# Patient Record
Sex: Female | Born: 1965 | Race: Black or African American | Hispanic: No | Marital: Married | Smoking: Former smoker
Health system: Southern US, Community
[De-identification: ages and names within clinical notes are randomized; demographics above are authoritative.]

## PROBLEM LIST (undated history)

## (undated) DIAGNOSIS — T7840XA Allergy, unspecified, initial encounter: Secondary | ICD-10-CM

## (undated) DIAGNOSIS — R42 Dizziness and giddiness: Secondary | ICD-10-CM

## (undated) DIAGNOSIS — E7800 Pure hypercholesterolemia, unspecified: Secondary | ICD-10-CM

## (undated) DIAGNOSIS — F32A Depression, unspecified: Secondary | ICD-10-CM

## (undated) DIAGNOSIS — I10 Essential (primary) hypertension: Secondary | ICD-10-CM

## (undated) DIAGNOSIS — G43909 Migraine, unspecified, not intractable, without status migrainosus: Secondary | ICD-10-CM

## (undated) DIAGNOSIS — M1990 Unspecified osteoarthritis, unspecified site: Secondary | ICD-10-CM

## (undated) DIAGNOSIS — J45909 Unspecified asthma, uncomplicated: Secondary | ICD-10-CM

## (undated) HISTORY — PX: BREAST EXCISIONAL BIOPSY: SUR124

## (undated) HISTORY — DX: Depression, unspecified: F32.A

## (undated) HISTORY — DX: Unspecified osteoarthritis, unspecified site: M19.90

## (undated) HISTORY — DX: Migraine, unspecified, not intractable, without status migrainosus: G43.909

## (undated) HISTORY — PX: TONSILLECTOMY: SUR1361

## (undated) HISTORY — DX: Unspecified asthma, uncomplicated: J45.909

## (undated) HISTORY — DX: Allergy, unspecified, initial encounter: T78.40XA

## (undated) HISTORY — DX: Dizziness and giddiness: R42

## (undated) HISTORY — DX: Essential (primary) hypertension: I10

## (undated) HISTORY — PX: BREAST SURGERY: SHX581

## (undated) HISTORY — PX: CYST REMOVAL LEG: SHX6280

## (undated) HISTORY — PX: FRACTURE SURGERY: SHX138

## (undated) HISTORY — PX: ABDOMINAL HYSTERECTOMY: SHX81

## (undated) HISTORY — DX: Pure hypercholesterolemia, unspecified: E78.00

---

## 1998-08-11 ENCOUNTER — Encounter: Admission: RE | Admit: 1998-08-11 | Discharge: 1998-11-09 | Payer: Self-pay | Admitting: Unknown Physician Specialty

## 1998-11-14 ENCOUNTER — Encounter: Admission: RE | Admit: 1998-11-14 | Discharge: 1999-02-12 | Payer: Self-pay

## 2015-12-14 ENCOUNTER — Other Ambulatory Visit: Payer: Self-pay | Admitting: Physician Assistant

## 2015-12-14 ENCOUNTER — Telehealth: Payer: Self-pay | Admitting: Physician Assistant

## 2015-12-14 MED ORDER — AMLODIPINE BESYLATE 10 MG PO TABS: 10.0000 mg | ORAL_TABLET | Freq: Every day | ORAL | 0 refills | Status: DC

## 2015-12-14 NOTE — Telephone Encounter (Signed)
Okay to call in #2 tabs as the patient requested

## 2015-12-14 NOTE — Telephone Encounter (Signed)
Rx sent, patient aware.

## 2015-12-15 ENCOUNTER — Ambulatory Visit (INDEPENDENT_AMBULATORY_CARE_PROVIDER_SITE_OTHER): Payer: BLUE CROSS/BLUE SHIELD | Admitting: Physician Assistant

## 2015-12-15 ENCOUNTER — Encounter: Payer: Self-pay | Admitting: Physician Assistant

## 2015-12-15 VITALS — BP 133/89 | HR 73 | Temp 97.1°F | Ht 63.0 in | Wt 205.6 lb

## 2015-12-15 DIAGNOSIS — I10 Essential (primary) hypertension: Secondary | ICD-10-CM | POA: Diagnosis not present

## 2015-12-15 DIAGNOSIS — J0100 Acute maxillary sinusitis, unspecified: Secondary | ICD-10-CM

## 2015-12-15 DIAGNOSIS — K5901 Slow transit constipation: Secondary | ICD-10-CM

## 2015-12-15 MED ORDER — POLYETHYLENE GLYCOL 3350 17 GM/SCOOP PO POWD: 17.0000 g | Freq: Once | ORAL | 1 refills | Status: AC

## 2015-12-15 MED ORDER — AMOXICILLIN 500 MG PO CAPS: 500.0000 mg | ORAL_CAPSULE | Freq: Three times a day (TID) | ORAL | 0 refills | Status: DC

## 2015-12-15 MED ORDER — HYDROCHLOROTHIAZIDE 25 MG PO TABS: 25.0000 mg | ORAL_TABLET | Freq: Every day | ORAL | 3 refills | Status: DC

## 2015-12-15 MED ORDER — AMLODIPINE BESYLATE 10 MG PO TABS: 10.0000 mg | ORAL_TABLET | Freq: Every day | ORAL | 3 refills | Status: DC

## 2015-12-15 NOTE — Progress Notes (Signed)
BP 133/89 (BP Location: Left Arm, Patient Position: Sitting, Cuff Size: Large)   Pulse 73   Temp 97.1 F (36.2 C) (Oral)   Wt 205 lb 9.6 oz (93.3 kg)    Subjective:    Patient ID: Cynthia [name], female    DOB: 21-Mar-1966, 49 y.o.   MRN: [id number]  Cynthia [name] is a 50 y.o. female presenting on 12/15/2015 for Medication Refill   HPI Patient here to be established as new patient at [facility].  This patient is known to me from [facility]. This patient's medical history is positive for chronic constipation, hypertension, hysterectomy for dysfunctional uterine bleeding while in her 7s, and recurrent sinusitis. She is here today for medication refill and she is having exacerbation of her constipation and sinusitis. Her sinuses have been bothering her for 2 weeks now. Over-the-counter medications have not helped much. She is having copious drainage that is thick and green and she will occasionally see blood. Her constipation has been lifelong and she has never been on a regimen of any type of medication. We are going to give her instructions about a clean out and maintaining on MiraLAX 17 g per day.   Relevant past medical, surgical, family and social history reviewed and updated as indicated. Interim medical history since our last visit reviewed. Allergies and medications reviewed and updated.   Data reviewed from any sources in EPIC.  Review of Systems  Constitutional: Positive for fatigue. Negative for activity change and fever.  HENT: Positive for ear pain, nosebleeds, postnasal drip, sinus pressure, sneezing and sore throat.   Eyes: Negative.   Respiratory: Negative.  Negative for cough.   Cardiovascular: Negative.  Negative for chest pain.  Gastrointestinal: Positive for abdominal distention, abdominal pain and constipation.  Endocrine: Negative.   Genitourinary: Negative.  Negative for dysuria.  Musculoskeletal: Negative.   Skin:  Negative.   Neurological: Negative.     Per HPI unless specifically indicated above  Social History   Social History  . Marital status: Unknown    Spouse name: N/A  . Number of children: N/A  . Years of education: N/A   Occupational History  . Not on file.   Social History Main Topics  . Smoking status: Current Some Day Smoker  . Smokeless tobacco: Never Used  . Alcohol use Yes     Comment: occ  . Drug use: No  . Sexual activity: Not on file   Other Topics Concern  . Not on file   Social History Narrative  . No narrative on file    Past Surgical History:  Procedure Laterality Date  . ABDOMINAL HYSTERECTOMY    . BREAST SURGERY Left    lump removed   . CYST REMOVAL LEG      Family History  Problem Relation Age of Onset  . Hypertension Father   . Kidney disease Father       Medication List       Accurate as of 12/15/15 10:23 AM. Always use your most recent med list.          amLODipine 10 MG tablet Commonly known as:  NORVASC Take 1 tablet (10 mg total) by mouth daily.   amoxicillin 500 MG capsule Commonly known as:  AMOXIL Take 1 capsule (500 mg total) by mouth 3 (three) times daily.   hydrochlorothiazide 25 MG tablet Commonly known as:  HYDRODIURIL Take 1 tablet (25 mg total) by mouth daily.   polyethylene glycol powder  powder Commonly known as:  GLYCOLAX/MIRALAX Take 17 g by mouth once.          Objective:    BP 133/89 (BP Location: Left Arm, Patient Position: Sitting, Cuff Size: Large)   Pulse 73   Temp 97.1 F (36.2 C) (Oral)   Wt 205 lb 9.6 oz (93.3 kg)   No Known Allergies Wt Readings from Last 3 Encounters:  12/15/15 205 lb 9.6 oz (93.3 kg)    Physical Exam  Constitutional: She is oriented to person, place, and time. She appears well-developed and well-nourished.  HENT:  Head: Normocephalic and atraumatic.  Right Ear: External ear and ear canal normal. No tenderness.  Left Ear: Tympanic membrane, external ear and ear canal  normal. No tenderness.  Nose: Mucosal edema, rhinorrhea and sinus tenderness present. Right sinus exhibits maxillary sinus tenderness. Right sinus exhibits no frontal sinus tenderness. Left sinus exhibits maxillary sinus tenderness. Left sinus exhibits no frontal sinus tenderness.  Mouth/Throat: Uvula is midline. Posterior oropharyngeal erythema present. No oropharyngeal exudate.  Eyes: Conjunctivae and EOM are normal. Pupils are equal, round, and reactive to light.  Neck: Normal range of motion. Neck supple.  Cardiovascular: Normal rate, regular rhythm, normal heart sounds and intact distal pulses.   Pulmonary/Chest: Effort normal. No respiratory distress. She has no wheezes.  Abdominal: Soft. Bowel sounds are normal. She exhibits no mass. There is tenderness. There is no guarding.  Neurological: She is alert and oriented to person, place, and time. She has normal reflexes.  Skin: Skin is warm and dry. No rash noted.  Psychiatric: She has a normal mood and affect. Her behavior is normal. Judgment and thought content normal.    No results found for this or any previous visit.    Assessment & Plan:   1. Essential hypertension Amlodipine 10 mg 1 daily Hydrochlorothiazide 25 mg 1 daily  2. Acute maxillary sinusitis, recurrence not specified Amoxicillin 500 mg 1 tab po QID 10 days  3. Slow transit constipation - polyethylene glycol powder (GLYCOLAX/MIRALAX) powder; Take 17 g by mouth once.  Dispense: 3350 g; Refill: 1  Follow up plan: Return in about 6 weeks (around 01/26/2016) for complete/mammo/labs.  Terald Sleeper PA-C [facility] [address]  [place], [place] [address] [phone]   12/15/2015, 10:23 AM

## 2015-12-15 NOTE — Patient Instructions (Signed)
GI clean out information givenConstipation, Adult Constipation is when a person has fewer than three bowel movements a week, has difficulty having a bowel movement, or has stools that are dry, hard, or larger than normal. As people grow older, constipation is more common. A low-fiber diet, not taking in enough fluids, and taking certain medicines may make constipation worse.  CAUSES   Certain medicines, such as antidepressants, pain medicine, iron supplements, antacids, and water pills.   Certain diseases, such as diabetes, irritable bowel syndrome (IBS), thyroid disease, or depression.   Not drinking enough water.   Not eating enough fiber-rich foods.   Stress or travel.   Lack of physical activity or exercise.   Ignoring the urge to have a bowel movement.   Using laxatives too much.  SIGNS AND SYMPTOMS   Having fewer than three bowel movements a week.   Straining to have a bowel movement.   Having stools that are hard, dry, or larger than normal.   Feeling full or bloated.   Pain in the lower abdomen.   Not feeling relief after having a bowel movement.  DIAGNOSIS  Your health care provider will take a medical history and perform a physical exam. Further testing may be done for severe constipation. Some tests may include:  A barium enema X-ray to examine your rectum, colon, and, sometimes, your small intestine.   A sigmoidoscopy to examine your lower colon.   A colonoscopy to examine your entire colon. TREATMENT  Treatment will depend on the severity of your constipation and what is causing it. Some dietary treatments include drinking more fluids and eating more fiber-rich foods. Lifestyle treatments may include regular exercise. If these diet and lifestyle recommendations do not help, your health care provider may recommend taking over-the-counter laxative medicines to help you have bowel movements. Prescription medicines may be prescribed if  over-the-counter medicines do not work.  HOME CARE INSTRUCTIONS   Eat foods that have a lot of fiber, such as fruits, vegetables, whole grains, and beans.  Limit foods high in fat and processed sugars, such as french fries, hamburgers, cookies, candies, and soda.   A fiber supplement may be added to your diet if you cannot get enough fiber from foods.   Drink enough fluids to keep your urine clear or pale yellow.   Exercise regularly or as directed by your health care provider.   Go to the restroom when you have the urge to go. Do not hold it.   Only take over-the-counter or prescription medicines as directed by your health care provider. Do not take other medicines for constipation without talking to your health care provider first.  [place] IF:   You have bright red blood in your stool.   Your constipation lasts for more than 4 days or gets worse.   You have abdominal or rectal pain.   You have thin, pencil-like stools.   You have unexplained weight loss. MAKE SURE YOU:   Understand these instructions.  Will watch your condition.  Will get help right away if you are not doing well or get worse.   This information is not intended to replace advice given to you by your health care provider. Make sure you discuss any questions you have with your health care provider.   Document Released: 12/23/2003 Document Revised: 04/16/2014 Document Reviewed: 01/05/2013 Elsevier Interactive Patient Education [employer].

## 2016-01-02 ENCOUNTER — Encounter: Payer: BLUE CROSS/BLUE SHIELD | Admitting: *Deleted

## 2016-01-03 ENCOUNTER — Telehealth: Payer: Self-pay | Admitting: Physician Assistant

## 2016-01-03 NOTE — Telephone Encounter (Signed)
appt letter faxed

## 2016-01-04 ENCOUNTER — Encounter: Payer: BLUE CROSS/BLUE SHIELD | Admitting: Physician Assistant

## 2016-01-06 ENCOUNTER — Encounter: Payer: BLUE CROSS/BLUE SHIELD | Admitting: Physician Assistant

## 2016-01-12 ENCOUNTER — Encounter: Payer: BLUE CROSS/BLUE SHIELD | Admitting: Physician Assistant

## 2016-01-16 ENCOUNTER — Encounter: Payer: BLUE CROSS/BLUE SHIELD | Admitting: Physician Assistant

## 2016-01-23 ENCOUNTER — Encounter: Payer: Self-pay | Admitting: Physician Assistant

## 2016-01-23 ENCOUNTER — Ambulatory Visit (INDEPENDENT_AMBULATORY_CARE_PROVIDER_SITE_OTHER): Payer: BLUE CROSS/BLUE SHIELD | Admitting: Physician Assistant

## 2016-01-23 VITALS — BP 129/79 | HR 82 | Temp 97.0°F | Ht 63.0 in | Wt 206.6 lb

## 2016-01-23 DIAGNOSIS — I10 Essential (primary) hypertension: Secondary | ICD-10-CM

## 2016-01-23 DIAGNOSIS — Z01419 Encounter for gynecological examination (general) (routine) without abnormal findings: Secondary | ICD-10-CM

## 2016-01-23 DIAGNOSIS — Z86018 Personal history of other benign neoplasm: Secondary | ICD-10-CM | POA: Insufficient documentation

## 2016-01-23 DIAGNOSIS — Z90710 Acquired absence of both cervix and uterus: Secondary | ICD-10-CM | POA: Insufficient documentation

## 2016-01-23 DIAGNOSIS — E785 Hyperlipidemia, unspecified: Secondary | ICD-10-CM

## 2016-01-23 DIAGNOSIS — Z6836 Body mass index (BMI) 36.0-36.9, adult: Secondary | ICD-10-CM | POA: Diagnosis not present

## 2016-01-23 DIAGNOSIS — Z0000 Encounter for general adult medical examination without abnormal findings: Secondary | ICD-10-CM | POA: Diagnosis not present

## 2016-01-23 DIAGNOSIS — J320 Chronic maxillary sinusitis: Secondary | ICD-10-CM

## 2016-01-23 DIAGNOSIS — Z1239 Encounter for other screening for malignant neoplasm of breast: Secondary | ICD-10-CM

## 2016-01-23 MED ORDER — AMOXICILLIN-POT CLAVULANATE 875-125 MG PO TABS: 1.0000 | ORAL_TABLET | Freq: Two times a day (BID) | ORAL | 1 refills | Status: DC

## 2016-01-23 NOTE — Progress Notes (Signed)
BP 129/79   Pulse 82   Temp 97 F (36.1 C) (Oral)   Ht 5\' 3"  (1.6 m)   Wt 206 lb 9.6 oz (93.7 kg)   BMI 36.60 kg/m    Subjective:    Patient ID: Cynthia [name], female    DOB: [birth year], 50 y.o.   MRN: [id number]  Cynthia [name] is a 50 y.o. female presenting on 01/23/2016 for Sinusitis and Annual Exam   Sinusitis  Associated symptoms include ear pain, sinus pressure and a sore throat. Pertinent negatives include no coughing or sneezing.  She has continued with right sided maxillary and frontal sinus pain. She has some tenderness around her eye. She is also having occasional dizziness when she moves quickly. Her left ear is not bothering her at all. She is here for her annual exam.   All of her medications are reviewed today. She is not having any other complaints at the sinusitis. All of her medications are reviewed and refilled today as needed. She is up-to-date on vaccines. She'll be having last performed today.   Relevant past medical, surgical, family and social history reviewed and updated as indicated. Interim medical history since our last visit reviewed. Allergies and medications reviewed and updated.   Data reviewed from any sources in EPIC.  Review of Systems  Constitutional: Negative.  Negative for activity change, fatigue and fever.  HENT: Positive for ear pain, facial swelling, postnasal drip, sinus pressure, sore throat and tinnitus. Negative for sneezing.   Eyes: Positive for discharge.  Respiratory: Negative.  Negative for cough.   Cardiovascular: Negative.  Negative for chest pain.  Gastrointestinal: Negative.  Negative for abdominal pain.  Endocrine: Negative.   Genitourinary: Negative.  Negative for dysuria.  Musculoskeletal: Negative.   Skin: Negative.   Neurological: Negative.      Social History   Social History  . Marital status: Unknown    Spouse name: N/A  . Number of children: N/A  . Years of education: N/A   Occupational History   . Not on file.   Social History Main Topics  . Smoking status: Current Some Day Smoker  . Smokeless tobacco: Never Used  . Alcohol use Yes     Comment: occ  . Drug use: No  . Sexual activity: Not on file   Other Topics Concern  . Not on file   Social History Narrative  . No narrative on file    Past Surgical History:  Procedure Laterality Date  . ABDOMINAL HYSTERECTOMY    . BREAST SURGERY Left    lump removed   . CYST REMOVAL LEG      Family History  Problem Relation Age of Onset  . Hypertension Father   . Kidney disease Father       Medication List       Accurate as of 01/23/16 10:23 AM. Always use your most recent med list.          amLODipine 10 MG tablet Commonly known as:  NORVASC Take 1 tablet (10 mg total) by mouth daily.   amoxicillin-clavulanate 875-125 MG tablet Commonly known as:  AUGMENTIN Take 1 tablet by mouth 2 (two) times daily.   hydrochlorothiazide 25 MG tablet Commonly known as:  HYDRODIURIL Take 1 tablet (25 mg total) by mouth daily.          Objective:    BP 129/79   Pulse 82   Temp 97 F (36.1 C) (Oral)   Ht 5\' 3"  (1.6 m)  Wt 206 lb 9.6 oz (93.7 kg)   BMI 36.60 kg/m   No Known Allergies Wt Readings from Last 3 Encounters:  01/23/16 206 lb 9.6 oz (93.7 kg)  12/15/15 205 lb 9.6 oz (93.3 kg)    Physical Exam  Constitutional: She is oriented to person, place, and time. She appears well-developed and well-nourished.  HENT:  Head: Normocephalic and atraumatic.  Eyes: Conjunctivae and EOM are normal. Pupils are equal, round, and reactive to light.  Neck: Normal range of motion. Neck supple.  Cardiovascular: Normal rate, regular rhythm, normal heart sounds and intact distal pulses.   Pulmonary/Chest: Effort normal and breath sounds normal.  Abdominal: Soft. Bowel sounds are normal.  Genitourinary: Vagina normal. Rectal exam shows no external hemorrhoid and no fissure. No breast swelling, tenderness, discharge or  bleeding. Pelvic exam was performed with patient supine. There is no tenderness or lesion on the right labia. There is no tenderness or lesion on the left labia. Right adnexum displays no mass, no tenderness and no fullness. Left adnexum displays no mass, no tenderness and no fullness. No tenderness or bleeding in the vagina. No vaginal discharge found.  Genitourinary Comments: S/p hysterectomy  Neurological: She is alert and oriented to person, place, and time. She has normal reflexes.  Skin: Skin is warm and dry. No rash noted.  Psychiatric: She has a normal mood and affect. Her behavior is normal. Judgment and thought content normal.        Assessment & Plan:   1. Well female exam with routine gynecological exam [facility], order will be sent Pap collection Breast exam performed  2. Essential hypertension Continue Medications  3. Chronic maxillary sinusitis - amoxicillin-clavulanate (AUGMENTIN) 875-125 MG tablet; Take 1 tablet by mouth 2 (two) times daily.  Dispense: 28 tablet; Refill: 1  4. History of uterine fibroid  5. S/P hysterectomy Due to fibroid pain  6. Body mass index 36.0-36.9, adult DASH diet   Continue all other maintenance medications as listed above. Educational handout given for DASH diet  Follow up plan: Return in about 1 year (around 01/22/2017).  Terald Sleeper PA-C [facility] [address]  [place],  [address] [phone]   01/23/2016, 10:23 AM

## 2016-01-23 NOTE — Patient Instructions (Signed)
DASH Eating Plan  DASH stands for "Dietary Approaches to Stop Hypertension." The DASH eating plan is a healthy eating plan that has been shown to reduce high blood pressure (hypertension). Additional health benefits may include reducing the risk of type 2 diabetes mellitus, heart disease, and stroke. The DASH eating plan may also help with weight loss.  WHAT DO I NEED TO KNOW ABOUT THE DASH EATING PLAN?  For the DASH eating plan, you will follow these general guidelines:  · Choose foods with a percent daily value for sodium of less than 5% (as listed on the food label).  · Use salt-free seasonings or herbs instead of table salt or sea salt.  · Check with your health care provider or pharmacist before using salt substitutes.  · Eat lower-sodium products, often labeled as "lower sodium" or "no salt added."  · Eat fresh foods.  · Eat more vegetables, fruits, and low-fat dairy products.  · Choose whole grains. Look for the word "whole" as the first word in the ingredient list.  · Choose fish and skinless chicken or turkey more often than red meat. Limit fish, poultry, and meat to 6 oz (170 g) each day.  · Limit sweets, desserts, sugars, and sugary drinks.  · Choose heart-healthy fats.  · Limit cheese to 1 oz (28 g) per day.  · Eat more home-cooked food and less restaurant, buffet, and fast food.  · Limit fried foods.  · Cook foods using methods other than frying.  · Limit canned vegetables. If you do use them, rinse them well to decrease the sodium.  · When eating at a restaurant, ask that your food be prepared with less salt, or no salt if possible.  WHAT FOODS CAN I EAT?  Seek help from a dietitian for individual calorie needs.  Grains  Whole grain or whole wheat bread. Brown rice. Whole grain or whole wheat pasta. Quinoa, bulgur, and whole grain cereals. Low-sodium cereals. Corn or whole wheat flour tortillas. Whole grain cornbread. Whole grain crackers. Low-sodium crackers.  Vegetables  Fresh or frozen vegetables  (raw, steamed, roasted, or grilled). Low-sodium or reduced-sodium tomato and vegetable juices. Low-sodium or reduced-sodium tomato sauce and paste. Low-sodium or reduced-sodium canned vegetables.   Fruits  All fresh, canned (in natural juice), or frozen fruits.  Meat and Other Protein Products  Ground beef (85% or leaner), grass-fed beef, or beef trimmed of fat. Skinless chicken or turkey. Ground chicken or turkey. Pork trimmed of fat. All fish and seafood. Eggs. Dried beans, peas, or lentils. Unsalted nuts and seeds. Unsalted canned beans.  Dairy  Low-fat dairy products, such as skim or 1% milk, 2% or reduced-fat cheeses, low-fat ricotta or cottage cheese, or plain low-fat yogurt. Low-sodium or reduced-sodium cheeses.  Fats and Oils  Tub margarines without trans fats. Light or reduced-fat mayonnaise and salad dressings (reduced sodium). Avocado. Safflower, olive, or canola oils. Natural peanut or almond butter.  Other  Unsalted popcorn and pretzels.  The items listed above may not be a complete list of recommended foods or beverages. Contact your dietitian for more options.  WHAT FOODS ARE NOT RECOMMENDED?  Grains  White bread. White pasta. White rice. Refined cornbread. Bagels and croissants. Crackers that contain trans fat.  Vegetables  Creamed or fried vegetables. Vegetables in a cheese sauce. Regular canned vegetables. Regular canned tomato sauce and paste. Regular tomato and vegetable juices.  Fruits  Dried fruits. Canned fruit in light or heavy syrup. Fruit juice.  Meat and Other Protein   Products  Fatty cuts of meat. Ribs, chicken wings, bacon, sausage, bologna, salami, chitterlings, fatback, hot dogs, bratwurst, and packaged luncheon meats. Salted nuts and seeds. Canned beans with salt.  Dairy  Whole or 2% milk, cream, half-and-half, and cream cheese. Whole-fat or sweetened yogurt. Full-fat cheeses or blue cheese. Nondairy creamers and whipped toppings. Processed cheese, cheese spreads, or cheese  curds.  Condiments  Onion and garlic salt, seasoned salt, table salt, and sea salt. Canned and packaged gravies. Worcestershire sauce. Tartar sauce. Barbecue sauce. Teriyaki sauce. Soy sauce, including reduced sodium. Steak sauce. Fish sauce. Oyster sauce. Cocktail sauce. Horseradish. Ketchup and mustard. Meat flavorings and tenderizers. Bouillon cubes. Hot sauce. Tabasco sauce. Marinades. Taco seasonings. Relishes.  Fats and Oils  Butter, stick margarine, lard, shortening, ghee, and bacon fat. Coconut, palm kernel, or palm oils. Regular salad dressings.  Other  Pickles and olives. Salted popcorn and pretzels.  The items listed above may not be a complete list of foods and beverages to avoid. Contact your dietitian for more information.  WHERE CAN I FIND MORE INFORMATION?  National Heart, Lung, and Blood Institute: www.nhlbi.nih.gov/health/health-topics/topics/dash/     This information is not intended to replace advice given to you by your health care provider. Make sure you discuss any questions you have with your health care provider.     Document Released: 03/15/2011 Document Revised: 04/16/2014 Document Reviewed: 01/28/2013  Elsevier Interactive Patient Education ©2016 Elsevier Inc.

## 2016-01-24 LAB — CBC WITH DIFFERENTIAL/PLATELET
BASOS ABS: 0 10*3/uL (ref 0.0–0.2)
Basos: 1 %
EOS (ABSOLUTE): 0.2 10*3/uL (ref 0.0–0.4)
Eos: 3 %
HEMATOCRIT: 37.6 % (ref 34.0–46.6)
HEMOGLOBIN: 12.5 g/dL (ref 11.1–15.9)
Immature Grans (Abs): 0 10*3/uL (ref 0.0–0.1)
Immature Granulocytes: 1 %
LYMPHS ABS: 2.9 10*3/uL (ref 0.7–3.1)
Lymphs: 43 %
MCH: 27.1 pg (ref 26.6–33.0)
MCHC: 33.2 g/dL (ref 31.5–35.7)
MCV: 81 fL (ref 79–97)
MONOCYTES: 7 %
Monocytes Absolute: 0.4 10*3/uL (ref 0.1–0.9)
NEUTROS ABS: 2.9 10*3/uL (ref 1.4–7.0)
Neutrophils: 45 %
Platelets: 378 10*3/uL (ref 150–379)
RBC: 4.62 x10E6/uL (ref 3.77–5.28)
RDW: 14.1 % (ref 12.3–15.4)
WBC: 6.4 10*3/uL (ref 3.4–10.8)

## 2016-01-24 LAB — CMP14+EGFR
ALBUMIN: 4.1 g/dL (ref 3.5–5.5)
ALK PHOS: 104 IU/L (ref 39–117)
ALT: 18 IU/L (ref 0–32)
AST: 17 IU/L (ref 0–40)
Albumin/Globulin Ratio: 1.3 (ref 1.2–2.2)
BUN / CREAT RATIO: 13 (ref 9–23)
BUN: 9 mg/dL (ref 6–24)
Bilirubin Total: 0.3 mg/dL (ref 0.0–1.2)
CO2: 25 mmol/L (ref 18–29)
CREATININE: 0.69 mg/dL (ref 0.57–1.00)
Calcium: 9.5 mg/dL (ref 8.7–10.2)
Chloride: 102 mmol/L (ref 96–106)
GFR, EST AFRICAN AMERICAN: 116 mL/min/{1.73_m2} (ref >59)
GFR, EST NON AFRICAN AMERICAN: 100 mL/min/{1.73_m2} (ref >59)
GLOBULIN, TOTAL: 3.1 g/dL (ref 1.5–4.5)
Glucose: 99 mg/dL (ref 65–99)
Potassium: 4 mmol/L (ref 3.5–5.2)
SODIUM: 141 mmol/L (ref 134–144)
Total Protein: 7.2 g/dL (ref 6.0–8.5)

## 2016-01-24 LAB — LIPID PANEL
CHOL/HDL RATIO: 5.8 ratio — AB (ref 0.0–4.4)
CHOLESTEROL TOTAL: 228 mg/dL — AB (ref 100–199)
HDL: 39 mg/dL — ABNORMAL LOW (ref >39)
LDL CALC: 153 mg/dL — AB (ref 0–99)
Triglycerides: 181 mg/dL — ABNORMAL HIGH (ref 0–149)
VLDL Cholesterol Cal: 36 mg/dL (ref 5–40)

## 2016-01-26 ENCOUNTER — Telehealth: Payer: Self-pay | Admitting: Physician Assistant

## 2016-01-26 NOTE — Telephone Encounter (Signed)
Patient aware that labs have not been reviewed

## 2016-01-26 NOTE — Telephone Encounter (Signed)
Called patient and LM that she can set screening mammogram up herself

## 2016-01-27 LAB — PAP IG W/ RFLX HPV ASCU: PAP SMEAR COMMENT: 0

## 2016-01-30 MED ORDER — SIMVASTATIN 10 MG PO TABS: 10.0000 mg | ORAL_TABLET | Freq: Every day | ORAL | 0 refills | Status: DC

## 2016-01-30 MED ORDER — ATORVASTATIN CALCIUM 10 MG PO TABS: 10.0000 mg | ORAL_TABLET | Freq: Every day | ORAL | 0 refills | Status: DC

## 2016-01-30 NOTE — Addendum Note (Signed)
Addended by: Thana Ates on: 01/30/2016 08:50 AM   Modules accepted: Orders

## 2016-01-30 NOTE — Telephone Encounter (Signed)
Resent to [facility] to confirm atorvastatin

## 2016-01-31 ENCOUNTER — Ambulatory Visit (INDEPENDENT_AMBULATORY_CARE_PROVIDER_SITE_OTHER): Payer: BLUE CROSS/BLUE SHIELD | Admitting: Physician Assistant

## 2016-01-31 VITALS — BP 136/88 | HR 79 | Temp 97.2°F | Ht 63.0 in | Wt 205.0 lb

## 2016-01-31 DIAGNOSIS — J0100 Acute maxillary sinusitis, unspecified: Secondary | ICD-10-CM

## 2016-01-31 DIAGNOSIS — H8111 Benign paroxysmal vertigo, right ear: Secondary | ICD-10-CM

## 2016-01-31 MED ORDER — METHYLPREDNISOLONE ACETATE 80 MG/ML IJ SUSP: 80.0000 mg | Freq: Once | INTRAMUSCULAR | Status: DC

## 2016-01-31 MED ORDER — MECLIZINE HCL 25 MG PO TABS: 25.0000 mg | ORAL_TABLET | Freq: Three times a day (TID) | ORAL | 0 refills | Status: DC | PRN

## 2016-01-31 NOTE — Patient Instructions (Signed)

## 2016-01-31 NOTE — Progress Notes (Signed)
BP 136/88   Pulse 79   Temp 97.2 F (36.2 C) (Oral)   Ht 5\' 3"  (1.6 m)   Wt 205 lb (93 kg)   BMI 36.31 kg/m    Subjective:    Patient ID: Cynthia [name], female    DOB: [birth year], 50 y.o.   MRN: [id number]  HPI: Cynthia [name] is a 50 y.o. female presenting on 01/31/2016 for Sinusitis; Dizziness; and watery eye Patient has not been taking her Augmentin on a regular basis. When she was taking 1 on that particular day she had some dizziness associated with the day and felt that it was caused by the medicine. So she is only been taking 1 a day since about it. Therefore she has had undertreated sinusitis. But at this point she is quite severely in pain on the right maxillary sinus and around the eye. She is also having pressure in her ear. She has daily consistent vertigo symptoms that are associated with the fluid builds up to the eustachian tube. She does have a brother that had had sinus surgery in the past. She has never seen an otolaryngologist concerning this.   Past Medical History:  Diagnosis Date  . Hypertension    Relevant past medical, surgical, family and social history reviewed and updated as indicated. Interim medical history since our last visit reviewed. Allergies and medications reviewed and updated. DATA REVIEWED: CHART IN EPIC  Social History   Social History  . Marital status: Unknown    Spouse name: N/A  . Number of children: N/A  . Years of education: N/A   Occupational History  . Not on file.   Social History Main Topics  . Smoking status: Current Some Day Smoker  . Smokeless tobacco: Never Used  . Alcohol use Yes     Comment: occ  . Drug use: No  . Sexual activity: Not on file   Other Topics Concern  . Not on file   Social History Narrative  . No narrative on file    Past Surgical History:  Procedure Laterality Date  . ABDOMINAL HYSTERECTOMY    . BREAST SURGERY Left    lump removed   . CYST REMOVAL LEG      Family History    Problem Relation Age of Onset  . Hypertension Father   . Kidney disease Father     Review of Systems  Constitutional: Negative.   HENT: Positive for congestion, ear pain, postnasal drip, sinus pressure and sore throat.   Eyes: Positive for pain.  Respiratory: Positive for cough and wheezing.   Gastrointestinal: Negative.   Genitourinary: Negative.   Neurological: Positive for dizziness.      Medication List       Accurate as of 01/31/16  4:24 PM. Always use your most recent med list.          amLODipine 10 MG tablet Commonly known as:  NORVASC Take 1 tablet (10 mg total) by mouth daily.   amoxicillin-clavulanate 875-125 MG tablet Commonly known as:  AUGMENTIN Take 1 tablet by mouth 2 (two) times daily.   atorvastatin 10 MG tablet Commonly known as:  LIPITOR Take 1 tablet (10 mg total) by mouth daily.   hydrochlorothiazide 25 MG tablet Commonly known as:  HYDRODIURIL Take 1 tablet (25 mg total) by mouth daily.   meclizine 25 MG tablet Commonly known as:  ANTIVERT Take 1 tablet (25 mg total) by mouth 3 (three) times daily as needed for dizziness.  Objective:    BP 136/88   Pulse 79   Temp 97.2 F (36.2 C) (Oral)   Ht 5\' 3"  (1.6 m)   Wt 205 lb (93 kg)   BMI 36.31 kg/m   No Known Allergies  Wt Readings from Last 3 Encounters:  01/31/16 205 lb (93 kg)  01/23/16 206 lb 9.6 oz (93.7 kg)  12/15/15 205 lb 9.6 oz (93.3 kg)    Physical Exam  Constitutional: She is oriented to person, place, and time. She appears well-developed and well-nourished.  HENT:  Head: Normocephalic and atraumatic.  Right Ear: External ear normal. Tympanic membrane is retracted. A middle ear effusion is present.  Left Ear: Tympanic membrane and external ear normal.  No middle ear effusion.  Nose: Mucosal edema and rhinorrhea present. Right sinus exhibits maxillary sinus tenderness and frontal sinus tenderness. Left sinus exhibits no maxillary sinus tenderness.   Mouth/Throat: Uvula is midline. Posterior oropharyngeal erythema present.  Eyes: Conjunctivae and EOM are normal. Pupils are equal, round, and reactive to light. Right eye exhibits no discharge. Left eye exhibits no discharge.  Neck: Normal range of motion.  Cardiovascular: Normal rate, regular rhythm and normal heart sounds.   Pulmonary/Chest: Effort normal and breath sounds normal. No respiratory distress. She has no wheezes.  Abdominal: Soft.  Lymphadenopathy:    She has no cervical adenopathy.  Neurological: She is alert and oriented to person, place, and time.  Skin: Skin is warm and dry.  Psychiatric: She has a normal mood and affect.        Assessment & Plan:   1. Acute non-recurrent maxillary sinusitis Complete Augmentin 875mg  BID. - methylPREDNISolone acetate (DEPO-MEDROL) injection 80 mg; Inject 1 mL (80 mg total) into the muscle once. - Ambulatory referral to ENT  2. Benign paroxysmal positional vertigo of right ear Meclizine 25 mg 1 tab QID for dizziness   Continue all other maintenance medications as listed above.  Follow up plan: Follow up as needed  Orders Placed This Encounter  Procedures  . Ambulatory referral to ENT    Educational handout given for   Terald Sleeper PA-C [facility] [address]  [place], [place] [address] [phone]   01/31/2016, 4:24 PM

## 2016-02-15 ENCOUNTER — Telehealth: Payer: Self-pay | Admitting: Physician Assistant

## 2016-02-15 DIAGNOSIS — Z0289 Encounter for other administrative examinations: Secondary | ICD-10-CM

## 2016-02-15 NOTE — Telephone Encounter (Signed)
Spoke with pt, FMLA is done and [employer] to be signed

## 2016-02-21 ENCOUNTER — Telehealth: Payer: Self-pay | Admitting: Physician Assistant

## 2016-02-22 NOTE — Telephone Encounter (Signed)
Pt is to bring me new forms

## 2016-04-09 HISTORY — PX: BREAST BIOPSY: SHX20

## 2016-04-13 ENCOUNTER — Encounter: Payer: Self-pay | Admitting: Neurology

## 2016-04-13 ENCOUNTER — Ambulatory Visit (INDEPENDENT_AMBULATORY_CARE_PROVIDER_SITE_OTHER): Payer: BLUE CROSS/BLUE SHIELD | Admitting: Neurology

## 2016-04-13 VITALS — BP 136/89 | HR 82 | Ht 62.0 in | Wt 211.2 lb

## 2016-04-13 DIAGNOSIS — G4489 Other headache syndrome: Secondary | ICD-10-CM | POA: Diagnosis not present

## 2016-04-13 MED ORDER — TOPIRAMATE 25 MG PO TABS: ORAL_TABLET | ORAL | 3 refills | Status: DC

## 2016-04-13 NOTE — Progress Notes (Signed)
Reason for visit: Headache  Referring physician: Dr. Tamala Julian is a 51 y.o. female  History of present illness:  Cynthia [name] is a 51 year old left-handed black female with a history of headaches throughout her life. The patient remembers having headaches as a child, and the headaches have always been on the right side. The patient has associated allergy type symptoms with nasal stuffiness, sneezing, and pressure sensations in the periorbital area and maxillary area on the right. The patient was felt to have sinus issues, but a recent ENT evaluation that included a sinus CT scan showed no evidence of sinus disease. The patient indicates that her headaches have gradually over time become more frequent, they are currently every other day. She is now starting to miss some days of work because the headache. The headache may occur off and on throughout the day, and she has soreness to touch on the lower right face, but the soreness will spread back into the entire right side of the head and includes the right neck with neck stiffness and discomfort. The patient has associated problems with dizziness and vertigo. She may have some nausea, but no vomiting. She may feel somewhat staggery at times. The patient may have a sensation of soreness on the maxillary teeth on the right. The patient reports photophobia and phonophobia with the headache. She may have some numbness and tingling sensations on the hands at times. She denies any issues controlling the bowels or the bladder. She denies any particular activating factors for her headache. The patient may take Aleve for the headache with some benefit. Allergy medications have not been beneficial. She is sent to this office for an evaluation of possible migraine headache.  Past Medical History:  Diagnosis Date  . High cholesterol   . Hypertension   . Migraine   . Vertigo     Past Surgical History:  Procedure Laterality Date  .  ABDOMINAL HYSTERECTOMY    . BREAST SURGERY Left    lump removed   . CYST REMOVAL LEG    . FRACTURE SURGERY Right    Arm  . TONSILLECTOMY      Family History  Problem Relation Age of Onset  . Heart failure Mother   . Hypertension Father   . Kidney disease Father   . Cancer Maternal Grandmother   . Cancer Maternal Aunt     Social history:  reports that she has been smoking Cigarettes.  She has never used smokeless tobacco. She reports that she drinks alcohol. She reports that she does not use drugs.  Medications:  Prior to Admission medications   Medication Sig Start Date End Date Taking? Authorizing Provider  hydrochlorothiazide (HYDRODIURIL) 25 MG tablet Take 1 tablet (25 mg total) by mouth daily. 12/15/15  Yes Cynthia Sleeper, PA-C  amLODipine (NORVASC) 10 MG tablet Take 1 tablet (10 mg total) by mouth daily. 12/15/15 03/14/16  Cynthia Sleeper, PA-C  topiramate (TOPAMAX) 25 MG tablet Take one tablet at night for one week, then take 2 tablets at night for one week, then take 3 tablets at night. 04/13/16   Cynthia Ducking, MD     No Known Allergies  ROS:  Out of a complete 14 system review of symptoms, the patient complains only of the following symptoms, and all other reviewed systems are negative.  Weight gain Ringing in the ears, dizziness Eye discomfort  Cough, wheezing Constipation Feeling hot, cold, increased thirst Achy muscles Headache, dizziness Depression, not  enough sleep, change in appetite, racing thoughts   Blood pressure 136/89, pulse 82, height 5\' 2"  (1.575 m), weight 211 lb 4 oz (95.8 kg).  Physical Exam  General: The patient is alert and cooperative at the time of the examination. The patient is markedly obese.  Eyes: Pupils are equal, round, and reactive to light. Discs are flat bilaterally.  Neck: The neck is supple, no carotid bruits are noted.  Respiratory: The respiratory examination is clear.  Cardiovascular: The cardiovascular examination reveals  a regular rate and rhythm, no obvious murmurs or rubs are noted.  Neuromuscular: The patient lacks about 30 of full lateral rotation of the cervical spine bilaterally. No crepitus is noted in the temporomandibular joints.  Skin: Extremities are without significant edema.  Neurologic Exam  Mental status: The patient is alert and oriented x 3 at the time of the examination. The patient has apparent normal recent and remote memory, with an apparently normal attention span and concentration ability.  Cranial nerves: Facial symmetry is present. There is good sensation of the face to pinprick and soft touch on the left, decreased on the right, the patient splits the midline of the forehead with vibration sensation, decreased on the right. The strength of the facial muscles and the muscles to head turning and shoulder shrug are normal bilaterally. Speech is well enunciated, no aphasia or dysarthria is noted. Extraocular movements are full. Visual fields are full. The tongue is midline, and the patient has symmetric elevation of the soft palate. No obvious hearing deficits are noted.  Motor: The motor testing reveals 5 over 5 strength of all 4 extremities. Good symmetric motor tone is noted throughout.  Sensory: Sensory testing is intact to pinprick, soft touch, vibration sensation, and position sense on all 4 extremities, with exception of decreased pinprick sensation and vibration sensation on the right arm and leg. No evidence of extinction is noted.  Coordination: Cerebellar testing reveals good finger-nose-finger and heel-to-shin bilaterally.  Gait and station: Gait is normal. Tandem gait is normal. Romberg is negative. No drift is seen.  Reflexes: Deep tendon reflexes are symmetric and normal bilaterally. Toes are downgoing bilaterally.   Assessment/Plan:  1. Headaches, possible migraine   2. History of dizziness   3. 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 The patient may have migraine headache, she will be set up for MRI of the brain given the right-sided sensory alteration on clinical examination. The patient will be placed on Topamax, she will follow-up in 3 months. She may take Aleve for the headache.   Cynthia Alexanders MD 04/13/2016 9:05 AM  [facility] [address] [place], [place] [address]  Phone [phone] Fax [phone]

## 2016-04-13 NOTE — Patient Instructions (Signed)
   We will start Topamax for the headache and get MRI of the brain.  Topamax (topiramate) is a seizure medication that has an FDA approval for seizures and for migraine headache. Potential side effects of this medication include weight loss, cognitive slowing, tingling in the fingers and toes, and carbonated drinks will taste bad. If any significant side effects are noted on this drug, please contact our office.

## 2016-04-23 DIAGNOSIS — G4489 Other headache syndrome: Secondary | ICD-10-CM | POA: Diagnosis not present

## 2016-05-01 ENCOUNTER — Ambulatory Visit (INDEPENDENT_AMBULATORY_CARE_PROVIDER_SITE_OTHER): Payer: Self-pay

## 2016-05-01 DIAGNOSIS — G4489 Other headache syndrome: Secondary | ICD-10-CM

## 2016-05-01 DIAGNOSIS — Z0289 Encounter for other administrative examinations: Secondary | ICD-10-CM

## 2016-05-06 ENCOUNTER — Telehealth: Payer: Self-pay | Admitting: Neurology

## 2016-05-06 NOTE — Telephone Encounter (Signed)
I called the patient. MRI of the brain is essentially normal, single tiny WM focus in the deep left frontal area. Of no clinical concern.   MRI brain 05/05/16:  IMPRESSION:  This MRI of the brain with and without contrast shows the following: 1.   There is a single T2/FLAIR hyperintense focus in the deep white matter of the left frontal lobe. As a nonspecific finding represents an age related focus of chronic microvascular ischemic change.   This could also be due to migraine.   The appearance is not typical for demyelination, 2.    There are no acute findings. There is a normal enhancement pattern.

## 2016-05-25 ENCOUNTER — Ambulatory Visit (INDEPENDENT_AMBULATORY_CARE_PROVIDER_SITE_OTHER): Payer: BLUE CROSS/BLUE SHIELD | Admitting: Physician Assistant

## 2016-05-25 ENCOUNTER — Encounter: Payer: Self-pay | Admitting: Physician Assistant

## 2016-05-25 VITALS — BP 111/76 | HR 98 | Temp 98.1°F | Ht 62.0 in | Wt 214.6 lb

## 2016-05-25 DIAGNOSIS — R110 Nausea: Secondary | ICD-10-CM | POA: Diagnosis not present

## 2016-05-25 DIAGNOSIS — K219 Gastro-esophageal reflux disease without esophagitis: Secondary | ICD-10-CM

## 2016-05-25 MED ORDER — OMEPRAZOLE 20 MG PO CPDR: 20.0000 mg | DELAYED_RELEASE_CAPSULE | Freq: Every day | ORAL | 3 refills | Status: DC

## 2016-05-25 NOTE — Patient Instructions (Addendum)
Align  Heartburn Introduction Heartburn is a type of pain or discomfort that can happen in the throat or chest. It is often described as a burning pain. It may also cause a bad taste in the mouth. Heartburn may feel worse when you lie down or bend over. It may be caused by stomach contents that move back up (reflux) into the tube that connects the mouth with the stomach (esophagus). Follow these instructions at home: Take these actions to lessen your discomfort and to help avoid problems. Diet  Follow a diet as told by your doctor. You may need to avoid foods and drinks such as:  Coffee and tea (with or without caffeine).  Drinks that contain alcohol.  Energy drinks and sports drinks.  Carbonated drinks or sodas.  Chocolate and cocoa.  Peppermint and mint flavorings.  Garlic and onions.  Horseradish.  Spicy and acidic foods, such as peppers, chili powder, curry powder, vinegar, hot sauces, and BBQ sauce.  Citrus fruit juices and citrus fruits, such as oranges, lemons, and limes.  Tomato-based foods, such as red sauce, chili, salsa, and pizza with red sauce.  Fried and fatty foods, such as donuts, french fries, potato chips, and high-fat dressings.  High-fat meats, such as hot dogs, rib eye steak, sausage, ham, and bacon.  High-fat dairy items, such as whole milk, butter, and cream cheese.  Eat small meals often. Avoid eating large meals.  Avoid drinking large amounts of liquid with your meals.  Avoid eating meals during the 2-3 hours before bedtime.  Avoid lying down right after you eat.  Do not exercise right after you eat. General instructions  Pay attention to any changes in your symptoms.  Take over-the-counter and prescription medicines only as told by your doctor. Do not take aspirin, ibuprofen, or other NSAIDs unless your doctor says it is okay.  Do not use any tobacco products, including cigarettes, chewing tobacco, and e-cigarettes. If you need help  quitting, ask your doctor.  Wear loose clothes. Do not wear anything tight around your waist.  Raise (elevate) the head of your bed about 6 inches (15 cm).  Try to lower your stress. If you need help doing this, ask your doctor.  If you are overweight, lose an amount of weight that is healthy for you. Ask your doctor about a safe weight loss goal.  Keep all follow-up visits as told by your doctor. This is important. Contact a doctor if:  You have new symptoms.  You lose weight and you do not know why it is happening.  You have trouble swallowing, or it hurts to swallow.  You have wheezing or a cough that keeps happening.  Your symptoms do not get better with treatment.  You have heartburn often for more than two weeks. Get help right away if:  You have pain in your arms, neck, jaw, teeth, or back.  You feel sweaty, dizzy, or light-headed.  You have chest pain or shortness of breath.  You throw up (vomit) and your throw up looks like blood or coffee grounds.  Your poop (stool) is bloody or black. This information is not intended to replace advice given to you by your health care provider. Make sure you discuss any questions you have with your health care provider. Document Released: 12/06/2010 Document Revised: 09/01/2015 Document Reviewed: 07/21/2014  2017 Elsevier

## 2016-05-28 NOTE — Progress Notes (Signed)
BP 111/76   Pulse 98   Temp 98.1 F (36.7 C) (Oral)   Ht 5\' 2"  (1.575 m)   Wt 214 lb 9.6 oz (97.3 kg)   BMI 39.25 kg/m    Subjective:    Patient ID: Cynthia [name], female    DOB: 1966/03/02, 51 y.o.   MRN: [id number]  HPI: Cynthia [name] is a 51 y.o. female presenting on 05/25/2016 for Nausea Patient with known recurrent sinusitis problems. She has had dizziness before associated with it. Over this time she is having associated nausea and vomiting. She states that she can retain food at times. She has never had a significant problem with GERD in the past.  Relevant past medical, surgical, family and social history reviewed and updated as indicated. Allergies and medications reviewed and updated.  Past Medical History:  Diagnosis Date  . High cholesterol   . Hypertension   . Migraine   . Vertigo     Past Surgical History:  Procedure Laterality Date  . ABDOMINAL HYSTERECTOMY    . BREAST SURGERY Left    lump removed   . CYST REMOVAL LEG    . FRACTURE SURGERY Right    Arm  . TONSILLECTOMY      Review of Systems  Constitutional: Negative for activity change, appetite change, chills, fatigue and fever.  HENT: Positive for congestion, postnasal drip and sore throat.   Eyes: Negative.   Respiratory: Negative for cough and wheezing.   Cardiovascular: Negative.  Negative for chest pain, palpitations and leg swelling.  Gastrointestinal: Positive for abdominal pain.  Genitourinary: Negative.   Musculoskeletal: Negative.   Skin: Negative.   Neurological: Positive for dizziness. Negative for headaches.    Allergies as of 05/25/2016   No Known Allergies     Medication List       Accurate as of 05/25/16 11:59 PM. Always use your most recent med list.          amLODipine 10 MG tablet Commonly known as:  NORVASC Take 1 tablet (10 mg total) by mouth daily.   amoxicillin 500 MG capsule Commonly known as:  AMOXIL TAKE 3 CAPSULES PO BID   hydrochlorothiazide  25 MG tablet Commonly known as:  HYDRODIURIL Take 1 tablet (25 mg total) by mouth daily.   meclizine 25 MG tablet Commonly known as:  ANTIVERT TK 1 T PO TID PRN   omeprazole 20 MG capsule Commonly known as:  PRILOSEC Take 1 capsule (20 mg total) by mouth daily.   PROMETHEGAN 25 MG suppository Generic drug:  promethazine UNW AND I 1 SUP REC  Q 8 H PRN   topiramate 25 MG tablet Commonly known as:  TOPAMAX Take one tablet at night for one week, then take 2 tablets at night for one week, then take 3 tablets at night.          Objective:    BP 111/76   Pulse 98   Temp 98.1 F (36.7 C) (Oral)   Ht 5\' 2"  (1.575 m)   Wt 214 lb 9.6 oz (97.3 kg)   BMI 39.25 kg/m   No Known Allergies  Physical Exam  Constitutional: She is oriented to person, place, and time. She appears well-developed and well-nourished.  HENT:  Head: Normocephalic and atraumatic.  Right Ear: A middle ear effusion is present.  Left Ear: A middle ear effusion is present.  Nose: Mucosal edema present. Right sinus exhibits no frontal sinus tenderness. Left sinus exhibits no frontal sinus tenderness.  Mouth/Throat: Posterior oropharyngeal erythema present. No oropharyngeal exudate or tonsillar abscesses.  Eyes: Conjunctivae and EOM are normal. Pupils are equal, round, and reactive to light.  Neck: Normal range of motion.  Cardiovascular: Normal rate, regular rhythm, normal heart sounds and intact distal pulses.   Pulmonary/Chest: Effort normal and breath sounds normal.  Abdominal: Soft. Bowel sounds are normal. She exhibits no distension. There is no tenderness. There is no guarding.  Neurological: She is alert and oriented to person, place, and time. She has normal reflexes.  Skin: Skin is warm and dry. No rash noted.  Psychiatric: She has a normal mood and affect. Her behavior is normal. Judgment and thought content normal.  Nursing note and vitals reviewed.       Assessment & Plan:   1. Nausea -  omeprazole (PRILOSEC) 20 MG capsule; Take 1 capsule (20 mg total) by mouth daily.  Dispense: 30 capsule; Refill: 3  2. Gastroesophageal reflux disease without esophagitis - meclizine (ANTIVERT) 25 MG tablet; TK 1 T PO TID PRN; Refill: 0 - PROMETHEGAN 25 MG suppository; UNW AND I 1 SUP REC  Q 8 H PRN; Refill: 0 - omeprazole (PRILOSEC) 20 MG capsule; Take 1 capsule (20 mg total) by mouth daily.  Dispense: 30 capsule; Refill: 3   Continue all other maintenance medications as listed above.  Follow up plan: Return if symptoms worsen or fail to improve.  Educational handout given for Barnum PA-C [facility] [address]  [place], [place] [address] [phone]   05/28/2016, 2:30 PM

## 2016-06-04 ENCOUNTER — Telehealth: Payer: Self-pay | Admitting: Physician Assistant

## 2016-07-12 ENCOUNTER — Ambulatory Visit: Payer: PRIVATE HEALTH INSURANCE | Admitting: Adult Health

## 2016-07-23 ENCOUNTER — Telehealth: Payer: Self-pay | Admitting: Physician Assistant

## 2016-07-23 NOTE — Telephone Encounter (Signed)
Spoke to pt

## 2016-08-22 ENCOUNTER — Ambulatory Visit: Payer: BLUE CROSS/BLUE SHIELD | Admitting: Adult Health

## 2016-08-31 ENCOUNTER — Telehealth: Payer: Self-pay | Admitting: Physician Assistant

## 2016-08-31 NOTE — Telephone Encounter (Signed)
Spoke with pt. She wants to be seen at the [facility]. Tried to schedule, they had already closed. Will call on  After Tuesday and hopefully getpm scheduled for Thursday 09/07/16 after 1pm. Requested films from [facility]

## 2016-08-31 NOTE — Telephone Encounter (Signed)
Patient states she had a mammogram 5/11 on our Mobile unit. States they told her she was going to need a referral from her PCP because they seen a mass on her report. Do you have her report?

## 2016-09-04 ENCOUNTER — Other Ambulatory Visit: Payer: Self-pay | Admitting: Physician Assistant

## 2016-09-04 DIAGNOSIS — N63 Unspecified lump in unspecified breast: Secondary | ICD-10-CM

## 2016-09-06 ENCOUNTER — Ambulatory Visit
Admission: RE | Admit: 2016-09-06 | Discharge: 2016-09-06 | Disposition: A | Discharge status: Home / Self Care | Payer: Self-pay | Source: Ambulatory Visit | Attending: Physician Assistant | Admitting: Physician Assistant

## 2016-09-06 ENCOUNTER — Ambulatory Visit
Admission: RE | Admit: 2016-09-06 | Discharge: 2016-09-06 | Disposition: A | Discharge status: Home / Self Care | Payer: PRIVATE HEALTH INSURANCE | Source: Ambulatory Visit | Attending: Physician Assistant | Admitting: Physician Assistant

## 2016-09-06 ENCOUNTER — Other Ambulatory Visit: Payer: Self-pay

## 2016-09-06 ENCOUNTER — Other Ambulatory Visit: Payer: Self-pay | Admitting: Physician Assistant

## 2016-09-06 DIAGNOSIS — N63 Unspecified lump in unspecified breast: Secondary | ICD-10-CM

## 2016-09-06 DIAGNOSIS — N6320 Unspecified lump in the left breast, unspecified quadrant: Secondary | ICD-10-CM

## 2016-09-10 ENCOUNTER — Other Ambulatory Visit: Payer: Self-pay | Admitting: Physician Assistant

## 2016-09-10 ENCOUNTER — Ambulatory Visit
Admission: RE | Admit: 2016-09-10 | Discharge: 2016-09-10 | Disposition: A | Discharge status: Home / Self Care | Payer: PRIVATE HEALTH INSURANCE | Source: Ambulatory Visit | Attending: Physician Assistant | Admitting: Physician Assistant

## 2016-09-10 DIAGNOSIS — N6320 Unspecified lump in the left breast, unspecified quadrant: Secondary | ICD-10-CM

## 2016-10-23 ENCOUNTER — Encounter: Payer: Self-pay | Admitting: Family Medicine

## 2016-10-23 ENCOUNTER — Ambulatory Visit (INDEPENDENT_AMBULATORY_CARE_PROVIDER_SITE_OTHER): Payer: BLUE CROSS/BLUE SHIELD | Admitting: Family Medicine

## 2016-10-23 VITALS — BP 114/71 | HR 80 | Temp 98.0°F | Ht 62.0 in | Wt 215.0 lb

## 2016-10-23 DIAGNOSIS — J0101 Acute recurrent maxillary sinusitis: Secondary | ICD-10-CM

## 2016-10-23 MED ORDER — LEVOFLOXACIN 500 MG PO TABS: 500.0000 mg | ORAL_TABLET | Freq: Every day | ORAL | 0 refills | Status: DC

## 2016-10-23 MED ORDER — BETAMETHASONE SOD PHOS & ACET 6 (3-3) MG/ML IJ SUSP
6.0000 mg | Freq: Once | INTRAMUSCULAR | Status: AC
  Administered 2016-10-23: 6 mg via INTRAMUSCULAR

## 2016-10-23 NOTE — Progress Notes (Signed)
Chief Complaint  Patient presents with  . Nasal Congestion    pt here today c/o nasal congestion     HPI  Patient presents today for recurrent symptoms including congestion, facial pain, nasal congestion, Moderate  productive cough, post nasal drip and sinus pressure. There is no fever, chills, or sweats. Onset of symptoms was several weeks. Gradually worsening since that time.    PMH: Smoking status noted ROS: Per HPI  Objective: BP 114/71   Pulse 80   Temp 98 F (36.7 C) (Oral)   Ht 5\' 2"  (1.575 m)   Wt 215 lb (97.5 kg)   BMI 39.32 kg/m  Gen: NAD, alert, cooperative with exam HEENT: NCAT, EOMI, PERRL. Nasal passages swollen, purulent exudate noted with erythema CV: RRR, good S1/S2, no murmur Resp: CTABL, no wheezes, non-labored Abd: SNTND, BS present, no guarding or organomegaly Ext: No edema, warm Neuro: Alert and oriented, No gross deficits  Assessment and plan:  1. Acute recurrent maxillary sinusitis     Meds ordered this encounter  Medications  . betamethasone acetate-betamethasone sodium phosphate (CELESTONE) injection 6 mg  . levofloxacin (LEVAQUIN) 500 MG tablet    Sig: Take 1 tablet (500 mg total) by mouth daily. For 10 days    Dispense:  10 tablet    Refill:  0    No orders of the defined types were placed in this encounter.   Follow up as needed.  Claretta Fraise, MD

## 2016-10-26 DIAGNOSIS — Z029 Encounter for administrative examinations, unspecified: Secondary | ICD-10-CM

## 2016-12-13 ENCOUNTER — Encounter: Payer: Self-pay | Admitting: Nurse Practitioner

## 2016-12-13 ENCOUNTER — Ambulatory Visit (INDEPENDENT_AMBULATORY_CARE_PROVIDER_SITE_OTHER): Payer: BLUE CROSS/BLUE SHIELD | Admitting: Nurse Practitioner

## 2016-12-13 VITALS — BP 127/84 | HR 65 | Temp 96.8°F | Ht 62.0 in | Wt 214.0 lb

## 2016-12-13 DIAGNOSIS — J0100 Acute maxillary sinusitis, unspecified: Secondary | ICD-10-CM

## 2016-12-13 MED ORDER — AMOXICILLIN-POT CLAVULANATE 875-125 MG PO TABS: 1.0000 | ORAL_TABLET | Freq: Two times a day (BID) | ORAL | 0 refills | Status: AC

## 2016-12-13 MED ORDER — METHYLPREDNISOLONE ACETATE 80 MG/ML IJ SUSP
80.0000 mg | Freq: Once | INTRAMUSCULAR | Status: AC
  Administered 2016-12-13: 80 mg via INTRAMUSCULAR

## 2016-12-13 NOTE — Progress Notes (Signed)
Subjective:     Cynthia [name] is a 51 y.o. female who presents for evaluation of sinus pain. Symptoms include: congestion, facial pain, headaches, nasal congestion and sinus pressure. Onset of symptoms was 1 week ago. Symptoms have been gradually worsening since that time. Past history is significant for occasional episodes of bronchitis. Patient is a smoker  (only occassionally).  The following portions of the patient's history were reviewed and updated as appropriate: allergies, current medications, past family history, past medical history, past social history, past surgical history and problem list.  Review of Systems Pertinent items noted in HPI and remainder of comprehensive ROS otherwise negative.   Objective:    BP 127/84   Pulse 65   Temp (!) 96.8 F (36 C) (Oral)   Ht 5\' 2"  (1.575 m)   Wt 214 lb (97.1 kg)   BMI 39.14 kg/m  General appearance: alert, cooperative and mild distress Eyes: conjunctivae/corneas clear. PERRL, EOM's intact. Fundi benign. Ears: normal TM's and external ear canals both ears Nose: clear discharge, moderate congestion, turbinates red, sinus tenderness bilateral, worse on right then left Throat: lips, mucosa, and tongue normal; teeth and gums normal Neck: no adenopathy, no carotid bruit, no JVD, supple, symmetrical, trachea midline and thyroid not enlarged, symmetric, no tenderness/mass/nodules Lungs: clear to auscultation bilaterally Heart: regular rate and rhythm, S1, S2 normal, no murmur, click, rub or gallop    Assessment:    Acute bacterial sinusitis.    Plan:     1. Take meds as prescribed 2. Use a cool mist humidifier especially during the winter months and when heat has been humid. 3. Use saline nose sprays frequently 4. Saline irrigations of the nose can be very helpful if done frequently.  * 4X daily for 1 week*  * Use of a nettie pot can be helpful with this. Follow directions with this* 5. Drink plenty of fluids 6. Keep  thermostat turn down low 7.For any cough or congestion  Use plain Mucinex- regular strength or max strength is fine   * Children- consult with Pharmacist for dosing 8. For fever or aces or pains- take tylenol or ibuprofen appropriate for age and weight.  * for fevers greater than 101 orally you may alternate ibuprofen and tylenol every  3 hours.  Meds ordered this encounter  Medications  . amoxicillin-clavulanate (AUGMENTIN) 875-125 MG tablet    Sig: Take 1 tablet by mouth 2 (two) times daily.    Dispense:  20 tablet    Refill:  0    Order Specific Question:   Supervising Provider    Answer:   VINCENT, CAROL L [[id number]]  . methylPREDNISolone acetate (DEPO-MEDROL) injection 80 mg   Mary-Margaret Hassell Done, FNP

## 2016-12-13 NOTE — Patient Instructions (Signed)

## 2016-12-30 ENCOUNTER — Other Ambulatory Visit: Payer: Self-pay | Admitting: Physician Assistant

## 2017-01-13 ENCOUNTER — Other Ambulatory Visit: Payer: Self-pay | Admitting: Physician Assistant

## 2017-04-13 ENCOUNTER — Other Ambulatory Visit: Payer: Self-pay | Admitting: Physician Assistant

## 2017-04-23 ENCOUNTER — Ambulatory Visit: Payer: BLUE CROSS/BLUE SHIELD | Admitting: Family

## 2017-04-23 ENCOUNTER — Encounter: Payer: Self-pay | Admitting: Family

## 2017-04-23 VITALS — BP 124/77 | HR 83 | Temp 97.2°F | Ht 62.0 in | Wt 223.0 lb

## 2017-04-23 DIAGNOSIS — A084 Viral intestinal infection, unspecified: Secondary | ICD-10-CM | POA: Diagnosis not present

## 2017-04-23 MED ORDER — ONDANSETRON 4 MG PO TBDP: 4.0000 mg | ORAL_TABLET | Freq: Three times a day (TID) | ORAL | 0 refills | Status: DC | PRN

## 2017-04-23 NOTE — Patient Instructions (Signed)
Food Choices to Help Relieve Diarrhea, Adult  When you have diarrhea, the foods you eat and your eating habits are very important. Choosing the right foods and drinks can help:  · Relieve diarrhea.  · Replace lost fluids and nutrients.  · Prevent dehydration.    What general guidelines should I follow?  Relieving diarrhea  · Choose foods with less than 2 g or .07 oz. of fiber per serving.  · Limit fats to less than 8 tsp (38 g or 1.34 oz.) a day.  · Avoid the following:  ? Foods and beverages sweetened with high-fructose corn syrup, honey, or sugar alcohols such as xylitol, sorbitol, and mannitol.  ? Foods that contain a lot of fat or sugar.  ? Fried, greasy, or spicy foods.  ? High-fiber grains, breads, and cereals.  ? Raw fruits and vegetables.  · Eat foods that are rich in probiotics. These foods include dairy products such as yogurt and fermented milk products. They help increase healthy bacteria in the stomach and intestines (gastrointestinal tract, or GI tract).  · If you have lactose intolerance, avoid dairy products. These may make your diarrhea worse.  · Take medicine to help stop diarrhea (antidiarrheal medicine) only as told by your health care provider.  Replacing nutrients  · Eat small meals or snacks every 3–4 hours.  · Eat bland foods, such as white rice, toast, or baked potato, until your diarrhea starts to get better. Gradually reintroduce nutrient-rich foods as tolerated or as told by your health care provider. This includes:  ? Well-cooked protein foods.  ? Peeled, seeded, and soft-cooked fruits and vegetables.  ? Low-fat dairy products.  · Take vitamin and mineral supplements as told by your health care provider.  Preventing dehydration    · Start by sipping water or a special solution to prevent dehydration (oral rehydration solution, ORS). Urine that is clear or pale yellow means that you are getting enough fluid.  · Try to drink at least 8–10 cups of fluid each day to help replace lost  fluids.  · You may add other liquids in addition to water, such as clear juice or decaffeinated sports drinks, as tolerated or as told by your health care provider.  · Avoid drinks with caffeine, such as coffee, tea, or soft drinks.  · Avoid alcohol.  What foods are recommended?  The items listed may not be a complete list. Talk with your health care provider about what dietary choices are best for you.  Grains  White rice. White, French, or pita breads (fresh or toasted), including plain rolls, buns, or bagels. White pasta. Saltine, soda, or graham crackers. Pretzels. Low-fiber cereal. Cooked cereals made with water (such as cornmeal, farina, or cream cereals). Plain muffins. Matzo. Melba toast. Zwieback.  Vegetables  Potatoes (without the skin). Most well-cooked and canned vegetables without skins or seeds. Tender lettuce.  Fruits  Apple sauce. Fruits canned in juice. Cooked apricots, cherries, grapefruit, peaches, pears, or plums. Fresh bananas and cantaloupe.  Meats and other protein foods  Baked or boiled chicken. Eggs. Tofu. Fish. Seafood. Smooth nut butters. Ground or well-cooked tender beef, ham, veal, lamb, pork, or poultry.  Dairy  Plain yogurt, kefir, and unsweetened liquid yogurt. Lactose-free milk, buttermilk, skim milk, or soy milk. Low-fat or nonfat hard cheese.  Beverages  Water. Low-calorie sports drinks. Fruit juices without pulp. Strained tomato and vegetable juices. Decaffeinated teas. Sugar-free beverages not sweetened with sugar alcohols. Oral rehydration solutions, if approved by your health care   provider.  Seasoning and other foods  Bouillon, broth, or soups made from recommended foods.  What foods are not recommended?  The items listed may not be a complete list. Talk with your health care provider about what dietary choices are best for you.  Grains  Whole grain, whole wheat, bran, or rye breads, rolls, pastas, and crackers. Wild or brown rice. Whole grain or bran cereals. Barley. Oats  and oatmeal. Corn tortillas or taco shells. Granola. Popcorn.  Vegetables  Raw vegetables. Fried vegetables. Cabbage, broccoli, Brussels sprouts, artichokes, baked beans, beet greens, corn, kale, legumes, peas, sweet potatoes, and yams. Potato skins. Cooked spinach and cabbage.  Fruits  Dried fruit, including raisins and dates. Raw fruits. Stewed or dried prunes. Canned fruits with syrup.  Meat and other protein foods  Fried or fatty meats. Deli meats. Chunky nut butters. Nuts and seeds. Beans and lentils. Bacon. Hot dogs. Sausage.  Dairy  High-fat cheeses. Whole milk, chocolate milk, and beverages made with milk, such as milk shakes. Half-and-half. Cream. sour cream. Ice cream.  Beverages  Caffeinated beverages (such as coffee, tea, soda, or energy drinks). Alcoholic beverages. Fruit juices with pulp. Prune juice. Soft drinks sweetened with high-fructose corn syrup or sugar alcohols. High-calorie sports drinks.  Fats and oils  Butter. Cream sauces. Margarine. Salad oils. Plain salad dressings. Olives. Avocados. Mayonnaise.  Sweets and desserts  Sweet rolls, doughnuts, and sweet breads. Sugar-free desserts sweetened with sugar alcohols such as xylitol and sorbitol.  Seasoning and other foods  Honey. Hot sauce. Chili powder. Gravy. Cream-based or milk-based soups. Pancakes and waffles.  Summary  · When you have diarrhea, the foods you eat and your eating habits are very important.  · Make sure you get at least 8–10 cups of fluid each day, or enough to keep your urine clear or pale yellow.  · Eat bland foods and gradually reintroduce healthy, nutrient-rich foods as tolerated, or as told by your health care provider.  · Avoid high-fiber, fried, greasy, or spicy foods.  This information is not intended to replace advice given to you by your health care provider. Make sure you discuss any questions you have with your health care provider.  Document Released: 06/16/2003 Document Revised: 03/23/2016 Document  Reviewed: 03/23/2016  Elsevier Interactive Patient Education © 2018 [employer]

## 2017-04-23 NOTE — Progress Notes (Signed)
   Subjective:    Patient ID: Cynthia [name], female    DOB: 14-Dec-1965, 52 y.o.   MRN: [id number]  Diarrhea   This is a new problem. The current episode started 1 to 4 weeks ago. The problem occurs 2 to 4 times per day. The problem has been gradually worsening. The stool consistency is described as watery. Associated symptoms include bloating, a fever, increased flatus and vomiting (nausea). Pertinent negatives include no abdominal pain. Nothing aggravates the symptoms. There are no known risk factors. She has tried increased fluids for the symptoms. The treatment provided no relief.      Review of Systems  Constitutional: Positive for fever.  Gastrointestinal: Positive for bloating, diarrhea, flatus and vomiting (nausea). Negative for abdominal pain.  All other systems reviewed and are negative.      Objective:   Physical Exam  Constitutional: She is oriented to person, place, and time. She appears well-developed and well-nourished. No distress.  HENT:  Head: Normocephalic and atraumatic.  Right Ear: External ear normal.  Mouth/Throat: Oropharynx is clear and moist.  Eyes: Pupils are equal, round, and reactive to light.  Neck: Normal range of motion. Neck supple. No thyromegaly present.  Cardiovascular: Normal rate, regular rhythm, normal heart sounds and intact distal pulses.  No murmur heard. Pulmonary/Chest: Effort normal and breath sounds normal. No respiratory distress. She has no wheezes.  Abdominal: Soft. Bowel sounds are normal. She exhibits no distension. There is no tenderness.  Musculoskeletal: Normal range of motion. She exhibits no edema or tenderness.  Neurological: She is alert and oriented to person, place, and time.  Skin: Skin is warm and dry.  Psychiatric: She has a normal mood and affect. Her behavior is normal. Judgment and thought content normal.  Vitals reviewed.     BP 124/77   Pulse 83   Temp (!) 97.2 F (36.2 C) (Oral)   Ht 5\' 2"  (1.575 m)    Wt 223 lb (101.2 kg)   BMI 40.79 kg/m      Assessment & Plan:  1. Viral gastroenteritis Imodium & zofran as needed Force fluids Bland diet If symptoms do not improve or worsen RTO and we may need to do lab work, but I feel like this is viral  - ondansetron (ZOFRAN ODT) 4 MG disintegrating tablet; Take 1 tablet (4 mg total) by mouth every 8 (eight) hours as needed for nausea or vomiting.  Dispense: 20 tablet; Refill: 0    Evelina Dun, FNP

## 2017-05-08 ENCOUNTER — Encounter: Payer: Self-pay | Admitting: Physician Assistant

## 2017-05-08 ENCOUNTER — Ambulatory Visit: Payer: BLUE CROSS/BLUE SHIELD | Admitting: Physician Assistant

## 2017-05-08 VITALS — BP 170/106 | HR 123 | Temp 102.3°F | Ht 62.0 in | Wt 225.6 lb

## 2017-05-08 DIAGNOSIS — J111 Influenza due to unidentified influenza virus with other respiratory manifestations: Secondary | ICD-10-CM | POA: Diagnosis not present

## 2017-05-08 DIAGNOSIS — J019 Acute sinusitis, unspecified: Secondary | ICD-10-CM

## 2017-05-08 DIAGNOSIS — R509 Fever, unspecified: Secondary | ICD-10-CM | POA: Diagnosis not present

## 2017-05-08 LAB — VERITOR FLU A/B WAIVED
INFLUENZA A: NEGATIVE
INFLUENZA B: NEGATIVE

## 2017-05-08 MED ORDER — AMOXICILLIN-POT CLAVULANATE 875-125 MG PO TABS: 1.0000 | ORAL_TABLET | Freq: Two times a day (BID) | ORAL | 0 refills | Status: DC

## 2017-05-08 MED ORDER — HYDROCODONE-HOMATROPINE 5-1.5 MG/5ML PO SYRP: 5.0000 mL | ORAL_SOLUTION | Freq: Four times a day (QID) | ORAL | 0 refills | Status: DC | PRN

## 2017-05-08 MED ORDER — OSELTAMIVIR PHOSPHATE 75 MG PO CAPS: 75.0000 mg | ORAL_CAPSULE | Freq: Two times a day (BID) | ORAL | 0 refills | Status: DC

## 2017-05-08 NOTE — Progress Notes (Signed)
BP (!) 170/106   Pulse (!) 123   Temp (!) 102.3 F (39.1 C) (Oral)   Ht 5\' 2"  (1.575 m)   Wt 225 lb 9.6 oz (102.3 kg)   BMI 41.26 kg/m    Subjective:    Patient ID: Cynthia [name], female    DOB: 05/29/1965, 52 y.o.   MRN: [id number]  HPI: Cynthia [name] is a 52 y.o. female presenting on 05/08/2017 for Cough; Hoarse; and Fever  This patient has had less than 2 days severe fever, chills, myalgias.  Complains of sinus headache and postnasal drainage. There is copious drainage at times. Associated sore throat, decreased appetite and headache.  Has been exposed to influenza.   This patient has had many days of sinus headache and postnasal drainage. There is copious drainage at times. Denies any fever at this time. There has been a history of sinus infections in the past.  No history of sinus surgery. There is cough at night. It has become more prevalent in recent days.   Relevant past medical, surgical, family and social history reviewed and updated as indicated. Allergies and medications reviewed and updated.  Past Medical History:  Diagnosis Date  . High cholesterol   . Hypertension   . Migraine   . Vertigo     Past Surgical History:  Procedure Laterality Date  . ABDOMINAL HYSTERECTOMY    . BREAST EXCISIONAL BIOPSY    . BREAST SURGERY Left    lump removed   . CYST REMOVAL LEG    . FRACTURE SURGERY Right    Arm  . TONSILLECTOMY      Review of Systems  Constitutional: Positive for chills and fatigue. Negative for activity change and appetite change.  HENT: Positive for congestion, postnasal drip and sore throat.   Eyes: Negative.   Respiratory: Positive for cough and wheezing.   Cardiovascular: Negative.  Negative for chest pain, palpitations and leg swelling.  Gastrointestinal: Negative.   Genitourinary: Negative.   Musculoskeletal: Positive for arthralgias.  Skin: Negative.   Neurological: Positive for headaches.    Allergies as of 05/08/2017   No Known  Allergies     Medication List        Accurate as of 05/08/17 12:12 PM. Always use your most recent med list.          amLODipine 10 MG tablet Commonly known as:  NORVASC TAKE 1 TABLET BY MOUTH DAILY   amoxicillin-clavulanate 875-125 MG tablet Commonly known as:  AUGMENTIN Take 1 tablet by mouth 2 (two) times daily.   hydrochlorothiazide 25 MG tablet Commonly known as:  HYDRODIURIL TAKE 1 TABLET(25 MG) BY MOUTH DAILY   HYDROcodone-homatropine 5-1.5 MG/5ML syrup Commonly known as:  HYCODAN Take 5-10 mLs by mouth every 6 (six) hours as needed.   meclizine 25 MG tablet Commonly known as:  ANTIVERT TK 1 T PO TID PRN   ondansetron 4 MG disintegrating tablet Commonly known as:  ZOFRAN ODT Take 1 tablet (4 mg total) by mouth every 8 (eight) hours as needed for nausea or vomiting.   oseltamivir 75 MG capsule Commonly known as:  TAMIFLU Take 1 capsule (75 mg total) by mouth 2 (two) times daily.          Objective:    BP (!) 170/106   Pulse (!) 123   Temp (!) 102.3 F (39.1 C) (Oral)   Ht 5\' 2"  (1.575 m)   Wt 225 lb 9.6 oz (102.3 kg)   BMI 41.26 kg/m  No Known Allergies  Physical Exam  Constitutional: She is oriented to person, place, and time. She appears well-developed and well-nourished. No distress.  HENT:  Head: Normocephalic and atraumatic.  Right Ear: Tympanic membrane normal.  Left Ear: Tympanic membrane normal.  Nose: Mucosal edema and rhinorrhea present. Right sinus exhibits no frontal sinus tenderness. Left sinus exhibits no frontal sinus tenderness.  Mouth/Throat: Posterior oropharyngeal erythema present. No oropharyngeal exudate or tonsillar abscesses.  Eyes: Conjunctivae and EOM are normal. Pupils are equal, round, and reactive to light.  Neck: Normal range of motion.  Cardiovascular: Normal rate, regular rhythm, normal heart sounds, intact distal pulses and normal pulses.  Pulmonary/Chest: Effort normal and breath sounds normal. No respiratory  distress.  Abdominal: Soft. Bowel sounds are normal.  Neurological: She is alert and oriented to person, place, and time. She has normal reflexes.  Skin: Skin is warm and dry. No rash noted.  Psychiatric: She has a normal mood and affect. Her behavior is normal. Judgment and thought content normal.  Nursing note and vitals reviewed.       Assessment & Plan:   1. Fever, unspecified fever cause - Veritor Flu A/B Waived  2. Influenza - oseltamivir (TAMIFLU) 75 MG capsule; Take 1 capsule (75 mg total) by mouth 2 (two) times daily.  Dispense: 10 capsule; Refill: 0  3. Acute non-recurrent sinusitis, unspecified location - HYDROcodone-homatropine (HYCODAN) 5-1.5 MG/5ML syrup; Take 5-10 mLs by mouth every 6 (six) hours as needed.  Dispense: 240 mL; Refill: 0 - amoxicillin-clavulanate (AUGMENTIN) 875-125 MG tablet; Take 1 tablet by mouth 2 (two) times daily.  Dispense: 20 tablet; Refill: 0     Current Outpatient Medications:  .  amLODipine (NORVASC) 10 MG tablet, TAKE 1 TABLET BY MOUTH DAILY, Disp: 90 tablet, Rfl: 0 .  hydrochlorothiazide (HYDRODIURIL) 25 MG tablet, TAKE 1 TABLET(25 MG) BY MOUTH DAILY, Disp: 90 tablet, Rfl: 1 .  meclizine (ANTIVERT) 25 MG tablet, TK 1 T PO TID PRN, Disp: , Rfl: 0 .  ondansetron (ZOFRAN ODT) 4 MG disintegrating tablet, Take 1 tablet (4 mg total) by mouth every 8 (eight) hours as needed for nausea or vomiting., Disp: 20 tablet, Rfl: 0 .  amoxicillin-clavulanate (AUGMENTIN) 875-125 MG tablet, Take 1 tablet by mouth 2 (two) times daily., Disp: 20 tablet, Rfl: 0 .  HYDROcodone-homatropine (HYCODAN) 5-1.5 MG/5ML syrup, Take 5-10 mLs by mouth every 6 (six) hours as needed., Disp: 240 mL, Rfl: 0 .  oseltamivir (TAMIFLU) 75 MG capsule, Take 1 capsule (75 mg total) by mouth 2 (two) times daily., Disp: 10 capsule, Rfl: 0 Continue all other maintenance medications as listed above.  Follow up plan: Follow-up as needed or worsening of symptoms. Call office for any  issues.   Educational handout given for [place] PA-C [place] [address]  [place], [place] [address] [phone]   05/08/2017, 12:12 PM

## 2017-05-13 ENCOUNTER — Encounter: Payer: Self-pay | Admitting: Nurse Practitioner

## 2017-05-13 ENCOUNTER — Ambulatory Visit: Payer: BLUE CROSS/BLUE SHIELD | Admitting: Nurse Practitioner

## 2017-05-13 ENCOUNTER — Ambulatory Visit (INDEPENDENT_AMBULATORY_CARE_PROVIDER_SITE_OTHER): Payer: BLUE CROSS/BLUE SHIELD

## 2017-05-13 VITALS — BP 116/78 | HR 85 | Temp 97.5°F | Ht 62.0 in | Wt 215.0 lb

## 2017-05-13 DIAGNOSIS — R059 Cough, unspecified: Secondary | ICD-10-CM

## 2017-05-13 DIAGNOSIS — R05 Cough: Secondary | ICD-10-CM

## 2017-05-13 MED ORDER — PREDNISONE 20 MG PO TABS: ORAL_TABLET | ORAL | 0 refills | Status: DC

## 2017-05-13 MED ORDER — METHYLPREDNISOLONE ACETATE 80 MG/ML IJ SUSP
80.0000 mg | Freq: Once | INTRAMUSCULAR | Status: AC
  Administered 2017-05-13: 80 mg via INTRAMUSCULAR

## 2017-05-13 MED ORDER — LEVALBUTEROL HCL 1.25 MG/3ML IN NEBU
1.2500 mg | INHALATION_SOLUTION | RESPIRATORY_TRACT | Status: AC
  Administered 2017-05-13: 1.25 mg via RESPIRATORY_TRACT

## 2017-05-13 MED ORDER — ALBUTEROL SULFATE HFA 108 (90 BASE) MCG/ACT IN AERS
2.0000 | INHALATION_SPRAY | Freq: Four times a day (QID) | RESPIRATORY_TRACT | 0 refills | Status: DC | PRN
Start: 2017-05-13 — End: 2018-01-15

## 2017-05-13 NOTE — Progress Notes (Signed)
Subjective:    Patient ID: Cynthia [name], female    DOB: 1965/10/04, 52 y.o.   MRN: [id number]  HPI patient was seen by ARonnald Ramp on 05/08/17 with fever. She was diagnosed with influenza and sinusitis. Was given tamiflu and augmentin. She comes in today stating that she is no better. Has no appetite, still has cough and is wheezing. No longer has fever.    Review of Systems  Constitutional: Positive for appetite change and fatigue. Negative for chills and fever.  HENT: Negative for congestion, ear pain, sinus pain, sore throat and trouble swallowing.   Respiratory: Positive for cough. Negative for shortness of breath.   Cardiovascular: Negative.   Genitourinary: Negative.   Neurological: Negative.   Psychiatric/Behavioral: Negative.   All other systems reviewed and are negative.      Objective:   Physical Exam  Constitutional: She is oriented to person, place, and time. She appears well-developed and well-nourished. No distress.  HENT:  Right Ear: Hearing, tympanic membrane, external ear and ear canal normal.  Left Ear: Hearing, tympanic membrane, external ear and ear canal normal.  Nose: Mucosal edema and rhinorrhea present. Right sinus exhibits no maxillary sinus tenderness and no frontal sinus tenderness. Left sinus exhibits no maxillary sinus tenderness and no frontal sinus tenderness.  Mouth/Throat: Uvula is midline, oropharynx is clear and moist and mucous membranes are normal.  Eyes: Conjunctivae are normal. Pupils are equal, round, and reactive to light.  Neck: Normal range of motion. Neck supple.  Cardiovascular: Normal rate and regular rhythm.  Pulmonary/Chest: Effort normal. She has wheezes (inspo and exp wheezes thrpoughout.).  Deep tight cough  Lymphadenopathy:    She has no cervical adenopathy.  Neurological: She is alert and oriented to person, place, and time.  Skin: Skin is warm and dry.  Psychiatric: She has a normal mood and affect. Her behavior is normal.  Judgment and thought content normal.   BP 116/78   Pulse 85   Temp (!) 97.5 F (36.4 C) (Oral)   Ht 5\' 2"  (1.575 m)   Wt 215 lb (97.5 kg)   SpO2 95%   BMI 39.32 kg/m   Chest xray- no cardiopulmonary disease-Mary-Margaret Hassell Done, FNP  S/P xopenex neb- looser cough with exp wheezes throughout.    Assessment & Plan:   1. Cough    Meds ordered this encounter  Medications  . levalbuterol (XOPENEX) nebulizer solution 1.25 mg  . methylPREDNISolone acetate (DEPO-MEDROL) injection 80 mg  . predniSONE (DELTASONE) 20 MG tablet    Sig: 2 po at sametime daily for 5 days- start tomorrow    Dispense:  10 tablet    Refill:  0    Order Specific Question:   Supervising Provider    Answer:   VINCENT, CAROL L [[id number]]  . albuterol (PROVENTIL HFA;VENTOLIN HFA) 108 (90 Base) MCG/ACT inhaler    Sig: Inhale 2 puffs into the lungs every 6 (six) hours as needed for wheezing or shortness of breath.    Dispense:  1 Inhaler    Refill:  0    Order Specific Question:   Supervising Provider    Answer:   VINCENT, CAROL L [[id number]]   1. Take meds as prescribed 2. Use a cool mist humidifier especially during the winter months and when heat has been humid. 3. Use saline nose sprays frequently 4. Saline irrigations of the nose can be very helpful if done frequently.  * 4X daily for 1 week*  * Use of a nettie pot  can be helpful with this. Follow directions with this* 5. Drink plenty of fluids 6. Keep thermostat turn down low 7.For any cough or congestion  Use plain Mucinex- regular strength or max strength is fine   * Children- consult with Pharmacist for dosing 8. For fever or aces or pains- take tylenol or ibuprofen appropriate for age and weight.  * for fevers greater than 101 orally you may alternate ibuprofen and tylenol every  3 hours.   Continue augmentin and tamiflu until all gone  Aline, FNP

## 2017-05-13 NOTE — Patient Instructions (Signed)

## 2017-05-16 ENCOUNTER — Telehealth: Payer: Self-pay | Admitting: Physician Assistant

## 2017-05-16 NOTE — Telephone Encounter (Signed)
Covering for PCP, Needs to be seen.  Laroy Apple, MD [facility] 05/16/2017, 4:17 PM

## 2017-05-16 NOTE — Telephone Encounter (Signed)
PT is not feeling any better she is not eating and she feels weak, she states she has deep wheezing, no fever. Pt is wanting to know what she can do because she has been in here several times and is not getting better.

## 2017-05-16 NOTE — Telephone Encounter (Addendum)
Pt aware and states she will call first thing Monday morning to get an appointment with [facility].

## 2017-05-20 ENCOUNTER — Encounter: Payer: Self-pay | Admitting: Physician Assistant

## 2017-05-20 ENCOUNTER — Ambulatory Visit: Payer: BLUE CROSS/BLUE SHIELD | Admitting: Physician Assistant

## 2017-05-20 VITALS — BP 137/89 | HR 90 | Temp 97.3°F | Ht 62.0 in | Wt 218.0 lb

## 2017-05-20 DIAGNOSIS — J40 Bronchitis, not specified as acute or chronic: Secondary | ICD-10-CM | POA: Diagnosis not present

## 2017-05-20 DIAGNOSIS — J301 Allergic rhinitis due to pollen: Secondary | ICD-10-CM

## 2017-05-20 DIAGNOSIS — J9801 Acute bronchospasm: Secondary | ICD-10-CM

## 2017-05-20 MED ORDER — MONTELUKAST SODIUM 10 MG PO TABS: 10.0000 mg | ORAL_TABLET | Freq: Every day | ORAL | 3 refills | Status: DC

## 2017-05-20 MED ORDER — BUDESONIDE-FORMOTEROL FUMARATE 160-4.5 MCG/ACT IN AERO: 2.0000 | INHALATION_SPRAY | Freq: Two times a day (BID) | RESPIRATORY_TRACT | 3 refills | Status: DC

## 2017-05-20 MED ORDER — LORATADINE 10 MG PO TBDP: 10.0000 mg | ORAL_TABLET | Freq: Every day | ORAL | 11 refills | Status: DC

## 2017-05-20 NOTE — Patient Instructions (Signed)
In a few days you may receive a survey in the mail or online from [employer] regarding your visit with us today. Please take a moment to fill this out. Your feedback is very important to our whole office. It can help us better understand your needs as well as improve your experience and satisfaction. Thank you for taking your time to complete it. We care about you.  Jayten Gabbard, PA-C  

## 2017-05-20 NOTE — Progress Notes (Signed)
BP 137/89   Pulse 90   Temp (!) 97.3 F (36.3 C) (Oral)   Ht 5\' 2"  (1.575 m)   Wt 218 lb (98.9 kg)   BMI 39.87 kg/m    Subjective:    Patient ID: Cynthia [name], female    DOB: Nov 18, 1965, 52 y.o.   MRN: [id number]  HPI: Cynthia [name] is a 52 y.o. female presenting on 05/20/2017 for Fatigue; no taste; and no appetite  Patient has had recurrent respiratory illnesses over the past 4 weeks.  She had the flu.  She also had some other bronchitis is.  She is experiencing still significant shortness of breath and tightness.  She has never had asthma before.  She does have allergic rhinitis.  Her son has asthma.  We went over her albuterol use.  Her technique was not correct.  We trained her on using this properly.  Relevant past medical, surgical, family and social history reviewed and updated as indicated. Allergies and medications reviewed and updated.  Past Medical History:  Diagnosis Date  . High cholesterol   . Hypertension   . Migraine   . Vertigo     Past Surgical History:  Procedure Laterality Date  . ABDOMINAL HYSTERECTOMY    . BREAST EXCISIONAL BIOPSY    . BREAST SURGERY Left    lump removed   . CYST REMOVAL LEG    . FRACTURE SURGERY Right    Arm  . TONSILLECTOMY      Review of Systems  Constitutional: Positive for fatigue. Negative for activity change, appetite change, chills and fever.  HENT: Positive for congestion. Negative for postnasal drip and sore throat.   Eyes: Negative.   Respiratory: Positive for cough, shortness of breath and wheezing.   Cardiovascular: Negative.  Negative for chest pain, palpitations and leg swelling.  Gastrointestinal: Negative.   Genitourinary: Negative.   Musculoskeletal: Negative.   Skin: Negative.   Neurological: Positive for headaches.    Allergies as of 05/20/2017   No Known Allergies     Medication List        Accurate as of 05/20/17 12:00 PM. Always use your most recent med list.          albuterol 108  (90 Base) MCG/ACT inhaler Commonly known as:  PROVENTIL HFA;VENTOLIN HFA Inhale 2 puffs into the lungs every 6 (six) hours as needed for wheezing or shortness of breath.   amLODipine 10 MG tablet Commonly known as:  NORVASC TAKE 1 TABLET BY MOUTH DAILY   budesonide-formoterol 160-4.5 MCG/ACT inhaler Commonly known as:  SYMBICORT Inhale 2 puffs into the lungs 2 (two) times daily. RINSE MOUTH   hydrochlorothiazide 25 MG tablet Commonly known as:  HYDRODIURIL TAKE 1 TABLET(25 MG) BY MOUTH DAILY   HYDROcodone-homatropine 5-1.5 MG/5ML syrup Commonly known as:  HYCODAN Take 5-10 mLs by mouth every 6 (six) hours as needed.   loratadine 10 MG dissolvable tablet Commonly known as:  CLARITIN REDITABS Take 1 tablet (10 mg total) by mouth daily.   meclizine 25 MG tablet Commonly known as:  ANTIVERT TK 1 T PO TID PRN   montelukast 10 MG tablet Commonly known as:  SINGULAIR Take 1 tablet (10 mg total) by mouth at bedtime.   ondansetron 4 MG disintegrating tablet Commonly known as:  ZOFRAN ODT Take 1 tablet (4 mg total) by mouth every 8 (eight) hours as needed for nausea or vomiting.          Objective:    BP 137/89  Pulse 90   Temp (!) 97.3 F (36.3 C) (Oral)   Ht 5\' 2"  (1.575 m)   Wt 218 lb (98.9 kg)   BMI 39.87 kg/m   No Known Allergies  Physical Exam  Constitutional: She is oriented to person, place, and time. She appears well-developed and well-nourished.  HENT:  Head: Normocephalic and atraumatic.  Right Ear: There is drainage and tenderness.  Left Ear: There is drainage and tenderness.  Nose: Mucosal edema and rhinorrhea present. Right sinus exhibits no maxillary sinus tenderness and no frontal sinus tenderness. Left sinus exhibits no maxillary sinus tenderness and no frontal sinus tenderness.  Mouth/Throat: Posterior oropharyngeal erythema present. No oropharyngeal exudate.  Eyes: Conjunctivae and EOM are normal. Pupils are equal, round, and reactive to light.    Neck: Normal range of motion. Neck supple.  Cardiovascular: Normal rate, regular rhythm, normal heart sounds and intact distal pulses.  Pulmonary/Chest: Effort normal. She has wheezes in the right upper field and the left upper field.  Abdominal: Soft. Bowel sounds are normal.  Neurological: She is alert and oriented to person, place, and time. She has normal reflexes.  Skin: Skin is warm and dry. No rash noted.  Psychiatric: She has a normal mood and affect. Her behavior is normal. Judgment and thought content normal.    Results for orders placed or performed in visit on 05/08/17  Veritor Flu A/B Waived  Result Value Ref Range   Influenza A Negative Negative   Influenza B Negative Negative      Assessment & Plan:   1. Bronchospasm - budesonide-formoterol (SYMBICORT) 160-4.5 MCG/ACT inhaler; Inhale 2 puffs into the lungs 2 (two) times daily. RINSE MOUTH  Dispense: 1 Inhaler; Refill: 3  2. Bronchitis  3. Allergic rhinitis due to pollen, unspecified seasonality - loratadine (CLARITIN REDITABS) 10 MG dissolvable tablet; Take 1 tablet (10 mg total) by mouth daily.  Dispense: 30 tablet; Refill: 11 - montelukast (SINGULAIR) 10 MG tablet; Take 1 tablet (10 mg total) by mouth at bedtime.  Dispense: 30 tablet; Refill: 3    Current Outpatient Medications:  .  albuterol (PROVENTIL HFA;VENTOLIN HFA) 108 (90 Base) MCG/ACT inhaler, Inhale 2 puffs into the lungs every 6 (six) hours as needed for wheezing or shortness of breath., Disp: 1 Inhaler, Rfl: 0 .  amLODipine (NORVASC) 10 MG tablet, TAKE 1 TABLET BY MOUTH DAILY, Disp: 90 tablet, Rfl: 0 .  budesonide-formoterol (SYMBICORT) 160-4.5 MCG/ACT inhaler, Inhale 2 puffs into the lungs 2 (two) times daily. RINSE MOUTH, Disp: 1 Inhaler, Rfl: 3 .  hydrochlorothiazide (HYDRODIURIL) 25 MG tablet, TAKE 1 TABLET(25 MG) BY MOUTH DAILY, Disp: 90 tablet, Rfl: 1 .  HYDROcodone-homatropine (HYCODAN) 5-1.5 MG/5ML syrup, Take 5-10 mLs by mouth every 6 (six)  hours as needed., Disp: 240 mL, Rfl: 0 .  loratadine (CLARITIN REDITABS) 10 MG dissolvable tablet, Take 1 tablet (10 mg total) by mouth daily., Disp: 30 tablet, Rfl: 11 .  meclizine (ANTIVERT) 25 MG tablet, TK 1 T PO TID PRN, Disp: , Rfl: 0 .  montelukast (SINGULAIR) 10 MG tablet, Take 1 tablet (10 mg total) by mouth at bedtime., Disp: 30 tablet, Rfl: 3 .  ondansetron (ZOFRAN ODT) 4 MG disintegrating tablet, Take 1 tablet (4 mg total) by mouth every 8 (eight) hours as needed for nausea or vomiting., Disp: 20 tablet, Rfl: 0 Continue all other maintenance medications as listed above.  Follow up plan: Return in about 4 weeks (around 06/17/2017) for recheck.  Educational handout given for [employer].  [facility] [address]  [place], [place] [address] [phone]   05/20/2017, 12:00 PM

## 2017-05-21 ENCOUNTER — Other Ambulatory Visit: Payer: Self-pay | Admitting: *Deleted

## 2017-05-21 MED ORDER — LORATADINE 10 MG PO TABS: 10.0000 mg | ORAL_TABLET | Freq: Every day | ORAL | 3 refills | Status: DC

## 2017-05-21 NOTE — Telephone Encounter (Signed)
Changed to regular claritin for cost savings for patient

## 2017-06-25 ENCOUNTER — Other Ambulatory Visit: Payer: Self-pay | Admitting: *Deleted

## 2017-07-12 ENCOUNTER — Other Ambulatory Visit: Payer: Self-pay | Admitting: Physician Assistant

## 2017-07-17 ENCOUNTER — Ambulatory Visit (INDEPENDENT_AMBULATORY_CARE_PROVIDER_SITE_OTHER): Payer: BLUE CROSS/BLUE SHIELD | Admitting: Physician Assistant

## 2017-07-17 ENCOUNTER — Encounter: Payer: Self-pay | Admitting: Physician Assistant

## 2017-07-17 VITALS — BP 126/73 | HR 75 | Temp 97.1°F | Ht 62.0 in | Wt 219.2 lb

## 2017-07-17 DIAGNOSIS — J301 Allergic rhinitis due to pollen: Secondary | ICD-10-CM | POA: Diagnosis not present

## 2017-07-17 DIAGNOSIS — J0101 Acute recurrent maxillary sinusitis: Secondary | ICD-10-CM | POA: Diagnosis not present

## 2017-07-17 MED ORDER — FLUTICASONE PROPIONATE 50 MCG/ACT NA SUSP: 2.0000 | Freq: Every day | NASAL | 6 refills | Status: DC

## 2017-07-17 MED ORDER — MONTELUKAST SODIUM 10 MG PO TABS: 10.0000 mg | ORAL_TABLET | Freq: Every day | ORAL | 3 refills | Status: DC

## 2017-07-17 MED ORDER — AMOXICILLIN-POT CLAVULANATE 875-125 MG PO TABS: 1.0000 | ORAL_TABLET | Freq: Two times a day (BID) | ORAL | 0 refills | Status: DC

## 2017-07-17 MED ORDER — LORATADINE 10 MG PO TABS: 10.0000 mg | ORAL_TABLET | Freq: Every day | ORAL | 3 refills | Status: DC

## 2017-07-17 NOTE — Progress Notes (Signed)
BP 126/73   Pulse 75   Temp (!) 97.1 F (36.2 C) (Oral)   Ht 5\' 2"  (1.575 m)   Wt 219 lb 3.2 oz (99.4 kg)   BMI 40.09 kg/m    Subjective:    Patient ID: Cynthia [name], female    DOB: 01/21/1966, 52 y.o.   MRN: [id number]  HPI: Cynthia [name] is a 52 y.o. female presenting on 07/17/2017 for Sinus Problem and Allergies  This patient has had many days of sinus headache and postnasal drainage. There is copious drainage at times. Denies any fever at this time. There has been a history of sinus infections in the past.  No history of sinus surgery. There is cough at night. It has become more prevalent in recent days.   Past Medical History:  Diagnosis Date  . High cholesterol   . Hypertension   . Migraine   . Vertigo    Relevant past medical, surgical, family and social history reviewed and updated as indicated. Interim medical history since our last visit reviewed. Allergies and medications reviewed and updated. DATA REVIEWED: CHART IN EPIC  Family History reviewed for pertinent findings.  Review of Systems  Constitutional: Positive for chills and fatigue. Negative for activity change, appetite change and fever.  HENT: Positive for congestion, postnasal drip, sinus pressure, sinus pain and sore throat.   Eyes: Negative.   Respiratory: Positive for cough. Negative for shortness of breath and wheezing.   Cardiovascular: Negative.  Negative for chest pain, palpitations and leg swelling.  Gastrointestinal: Negative.   Genitourinary: Negative.   Musculoskeletal: Negative.   Skin: Negative.   Neurological: Positive for headaches.    Allergies as of 07/17/2017   No Known Allergies     Medication List        Accurate as of 07/17/17 11:07 AM. Always use your most recent med list.          albuterol 108 (90 Base) MCG/ACT inhaler Commonly known as:  PROVENTIL HFA;VENTOLIN HFA Inhale 2 puffs into the lungs every 6 (six) hours as needed for wheezing or shortness of  breath.   amLODipine 10 MG tablet Commonly known as:  NORVASC TAKE 1 TABLET BY MOUTH DAILY   amoxicillin-clavulanate 875-125 MG tablet Commonly known as:  AUGMENTIN Take 1 tablet by mouth 2 (two) times daily.   budesonide-formoterol 160-4.5 MCG/ACT inhaler Commonly known as:  SYMBICORT Inhale 2 puffs into the lungs 2 (two) times daily. RINSE MOUTH   fluticasone 50 MCG/ACT nasal spray Commonly known as:  FLONASE Place 2 sprays into both nostrils daily.   hydrochlorothiazide 25 MG tablet Commonly known as:  HYDRODIURIL TAKE 1 TABLET(25 MG) BY MOUTH DAILY   HYDROcodone-homatropine 5-1.5 MG/5ML syrup Commonly known as:  HYCODAN Take 5-10 mLs by mouth every 6 (six) hours as needed.   loratadine 10 MG tablet Commonly known as:  CLARITIN Take 1 tablet (10 mg total) by mouth daily.   meclizine 25 MG tablet Commonly known as:  ANTIVERT TK 1 T PO TID PRN   montelukast 10 MG tablet Commonly known as:  SINGULAIR Take 1 tablet (10 mg total) by mouth at bedtime.   ondansetron 4 MG disintegrating tablet Commonly known as:  ZOFRAN ODT Take 1 tablet (4 mg total) by mouth every 8 (eight) hours as needed for nausea or vomiting.          Objective:    BP 126/73   Pulse 75   Temp (!) 97.1 F (36.2 C) (Oral)  Ht 5\' 2"  (1.575 m)   Wt 219 lb 3.2 oz (99.4 kg)   BMI 40.09 kg/m   No Known Allergies  Wt Readings from Last 3 Encounters:  07/17/17 219 lb 3.2 oz (99.4 kg)  05/20/17 218 lb (98.9 kg)  05/13/17 215 lb (97.5 kg)    Physical Exam  Constitutional: She is oriented to person, place, and time. She appears well-developed and well-nourished.  HENT:  Head: Normocephalic and atraumatic.  Right Ear: Tympanic membrane and external ear normal. No middle ear effusion.  Left Ear: Tympanic membrane and external ear normal.  No middle ear effusion.  Nose: Mucosal edema and rhinorrhea present. Right sinus exhibits no maxillary sinus tenderness. Left sinus exhibits no maxillary  sinus tenderness.  Mouth/Throat: Uvula is midline. Posterior oropharyngeal erythema present.  Eyes: Pupils are equal, round, and reactive to light. Conjunctivae and EOM are normal. Right eye exhibits no discharge. Left eye exhibits no discharge.  Neck: Normal range of motion.  Cardiovascular: Normal rate, regular rhythm and normal heart sounds.  Pulmonary/Chest: Effort normal and breath sounds normal. No respiratory distress. She has no wheezes.  Abdominal: Soft.  Lymphadenopathy:    She has no cervical adenopathy.  Neurological: She is alert and oriented to person, place, and time.  Skin: Skin is warm and dry.  Psychiatric: She has a normal mood and affect.        Assessment & Plan:   1. Acute recurrent maxillary sinusitis - amoxicillin-clavulanate (AUGMENTIN) 875-125 MG tablet; Take 1 tablet by mouth 2 (two) times daily.  Dispense: 20 tablet; Refill: 0  2. Allergic rhinitis due to pollen, unspecified seasonality - montelukast (SINGULAIR) 10 MG tablet; Take 1 tablet (10 mg total) by mouth at bedtime.  Dispense: 30 tablet; Refill: 3 - loratadine (CLARITIN) 10 MG tablet; Take 1 tablet (10 mg total) by mouth daily.  Dispense: 90 tablet; Refill: 3 - fluticasone (FLONASE) 50 MCG/ACT nasal spray; Place 2 sprays into both nostrils daily.  Dispense: 16 g; Refill: 6   Continue all other maintenance medications as listed above.  Follow up plan: No follow-ups on file.  Educational handout given for [place] PA-C [facility] [address]  [place], [place] [address] [phone]   07/17/2017, 11:07 AM

## 2017-07-17 NOTE — Patient Instructions (Signed)
In a few days you may receive a survey in the mail or online from [employer] regarding your visit with us today. Please take a moment to fill this out. Your feedback is very important to our whole office. It can help us better understand your needs as well as improve your experience and satisfaction. Thank you for taking your time to complete it. We care about you.  Hampton [place], PA-C  

## 2017-07-21 ENCOUNTER — Other Ambulatory Visit: Payer: Self-pay | Admitting: Physician Assistant

## 2017-08-01 ENCOUNTER — Ambulatory Visit: Payer: BLUE CROSS/BLUE SHIELD | Admitting: Nurse Practitioner

## 2017-08-05 ENCOUNTER — Encounter: Payer: Self-pay | Admitting: Physician Assistant

## 2017-10-08 ENCOUNTER — Other Ambulatory Visit: Payer: Self-pay | Admitting: Physician Assistant

## 2018-01-10 ENCOUNTER — Other Ambulatory Visit: Payer: Self-pay | Admitting: Physician Assistant

## 2018-01-13 ENCOUNTER — Ambulatory Visit: Payer: BLUE CROSS/BLUE SHIELD | Admitting: Nurse Practitioner

## 2018-01-13 ENCOUNTER — Telehealth: Payer: Self-pay | Admitting: Physician Assistant

## 2018-01-13 ENCOUNTER — Encounter: Payer: Self-pay | Admitting: Nurse Practitioner

## 2018-01-13 VITALS — BP 118/73 | HR 90 | Temp 97.5°F | Ht 62.0 in | Wt 219.0 lb

## 2018-01-13 DIAGNOSIS — I10 Essential (primary) hypertension: Secondary | ICD-10-CM

## 2018-01-13 DIAGNOSIS — H1033 Unspecified acute conjunctivitis, bilateral: Secondary | ICD-10-CM

## 2018-01-13 MED ORDER — AMLODIPINE BESYLATE 10 MG PO TABS: 10.0000 mg | ORAL_TABLET | Freq: Every day | ORAL | 0 refills | Status: DC

## 2018-01-13 MED ORDER — POLYMYXIN B-TRIMETHOPRIM 10000-0.1 UNIT/ML-% OP SOLN: 2.0000 [drp] | OPHTHALMIC | 0 refills | Status: DC

## 2018-01-13 MED ORDER — HYDROCHLOROTHIAZIDE 25 MG PO TABS
ORAL_TABLET | ORAL | 0 refills | Status: DC
Start: 2018-01-13 — End: 2018-01-13

## 2018-01-13 MED ORDER — HYDROCHLOROTHIAZIDE 25 MG PO TABS: ORAL_TABLET | ORAL | 0 refills | Status: DC

## 2018-01-13 NOTE — Telephone Encounter (Signed)
Rx sent for patient.

## 2018-01-13 NOTE — Addendum Note (Signed)
Addended by: Rolena Infante on: 01/13/2018 02:09 PM   Modules accepted: Orders

## 2018-01-13 NOTE — Progress Notes (Signed)
   Subjective:    Patient ID: Cynthia [name], female    DOB: 01-03-1966, 52 y.o.   MRN: [id number]   Chief Complaint: Eyes red and swollen and Ear Pain (right)   HPI Patient in c/o red swollen eyes . She thinks it os from her allergies. They have been draining and have been matted together this morning. Have been itching.   Review of Systems  Constitutional: Negative for activity change and appetite change.  HENT: Negative.   Eyes: Negative for pain.  Respiratory: Negative for shortness of breath.   Cardiovascular: Negative for chest pain, palpitations and leg swelling.  Gastrointestinal: Negative for abdominal pain.  Endocrine: Negative for polydipsia.  Genitourinary: Negative.   Skin: Negative for rash.  Neurological: Negative for dizziness, weakness and headaches.  Hematological: Does not bruise/bleed easily.  Psychiatric/Behavioral: Negative.   All other systems reviewed and are negative.      Objective:   Physical Exam  Constitutional: She is oriented to person, place, and time. She appears well-developed and well-nourished. No distress.  HENT:  Head: Normocephalic.  Nose: Nose normal.  Mouth/Throat: Oropharynx is clear and moist.  Eyes: Pupils are equal, round, and reactive to light. EOM are normal. Right eye exhibits discharge. Left eye exhibits discharge.  conjunctiiia red with scleral injection.  Neck: Normal range of motion. Neck supple. No JVD present. Carotid bruit is not present.  Cardiovascular: Normal rate, regular rhythm, normal heart sounds and intact distal pulses.  Pulmonary/Chest: Effort normal and breath sounds normal. No respiratory distress. She has no wheezes. She has no rales. She exhibits no tenderness.  Abdominal: Soft. Normal appearance, normal aorta and bowel sounds are normal. She exhibits no distension, no abdominal bruit, no pulsatile midline mass and no mass. There is no splenomegaly or hepatomegaly. There is no tenderness.    Musculoskeletal: Normal range of motion. She exhibits no edema.  Lymphadenopathy:    She has no cervical adenopathy.  Neurological: She is alert and oriented to person, place, and time. She has normal reflexes.  Skin: Skin is warm and dry.  Psychiatric: She has a normal mood and affect. Her behavior is normal. Judgment and thought content normal.  Nursing note and vitals reviewed.  BP 118/73   Pulse 90   Temp (!) 97.5 F (36.4 C) (Oral)   Ht 5\' 2"  (1.575 m)   Wt 219 lb (99.3 kg)   BMI 40.06 kg/m         Assessment & Plan:  Cynthia [name] in today with chief complaint of Eyes red and swollen and Ear Pain (right)   1. Essential hypertension Need follow up appointment with PCP for check up and labs - hydrochlorothiazide (HYDRODIURIL) 25 MG tablet; TAKE 1 TABLET(25 MG) BY MOUTH DAILY  Dispense: 30 tablet; Refill: 0 - amLODipine (NORVASC) 10 MG tablet; Take 1 tablet (10 mg total) by mouth daily.  Dispense: 30 tablet; Refill: 0  2. Acute bacterial conjunctivitis of both eyes Cool compresses Avoid rubbing Good handwashing - trimethoprim-polymyxin b (POLYTRIM) ophthalmic solution; Place 2 drops into both eyes every 4 (four) hours.  Dispense: 10 mL; Refill: 0  Mary-Margaret Hassell Done, FNP

## 2018-01-13 NOTE — Patient Instructions (Signed)

## 2018-01-15 ENCOUNTER — Encounter: Payer: Self-pay | Admitting: Physician Assistant

## 2018-01-15 ENCOUNTER — Ambulatory Visit (INDEPENDENT_AMBULATORY_CARE_PROVIDER_SITE_OTHER): Payer: BLUE CROSS/BLUE SHIELD | Admitting: Physician Assistant

## 2018-01-15 VITALS — BP 134/82 | HR 95 | Temp 97.3°F | Ht 62.0 in | Wt 218.4 lb

## 2018-01-15 DIAGNOSIS — J0111 Acute recurrent frontal sinusitis: Secondary | ICD-10-CM | POA: Insufficient documentation

## 2018-01-15 DIAGNOSIS — J45909 Unspecified asthma, uncomplicated: Secondary | ICD-10-CM | POA: Insufficient documentation

## 2018-01-15 DIAGNOSIS — J9801 Acute bronchospasm: Secondary | ICD-10-CM

## 2018-01-15 DIAGNOSIS — J301 Allergic rhinitis due to pollen: Secondary | ICD-10-CM

## 2018-01-15 DIAGNOSIS — R05 Cough: Secondary | ICD-10-CM

## 2018-01-15 DIAGNOSIS — J4521 Mild intermittent asthma with (acute) exacerbation: Secondary | ICD-10-CM

## 2018-01-15 DIAGNOSIS — R059 Cough, unspecified: Secondary | ICD-10-CM

## 2018-01-15 MED ORDER — LORATADINE 10 MG PO TABS: 10.0000 mg | ORAL_TABLET | Freq: Every day | ORAL | 3 refills | Status: DC

## 2018-01-15 MED ORDER — ALBUTEROL SULFATE HFA 108 (90 BASE) MCG/ACT IN AERS: 2.0000 | INHALATION_SPRAY | Freq: Four times a day (QID) | RESPIRATORY_TRACT | 11 refills | Status: DC | PRN

## 2018-01-15 MED ORDER — MONTELUKAST SODIUM 10 MG PO TABS: 10.0000 mg | ORAL_TABLET | Freq: Every day | ORAL | 3 refills | Status: DC

## 2018-01-15 MED ORDER — METHYLPREDNISOLONE ACETATE 80 MG/ML IJ SUSP
80.0000 mg | Freq: Once | INTRAMUSCULAR | Status: AC
  Administered 2018-01-15: 80 mg via INTRAMUSCULAR

## 2018-01-15 MED ORDER — BUDESONIDE-FORMOTEROL FUMARATE 160-4.5 MCG/ACT IN AERO: 2.0000 | INHALATION_SPRAY | Freq: Two times a day (BID) | RESPIRATORY_TRACT | 11 refills | Status: DC

## 2018-01-15 MED ORDER — FLUTICASONE PROPIONATE 50 MCG/ACT NA SUSP: 2.0000 | Freq: Every day | NASAL | 11 refills | Status: DC

## 2018-01-15 MED ORDER — AMOXICILLIN-POT CLAVULANATE 875-125 MG PO TABS: 1.0000 | ORAL_TABLET | Freq: Two times a day (BID) | ORAL | 0 refills | Status: DC

## 2018-01-15 NOTE — Progress Notes (Signed)
BP 134/82   Pulse 95   Temp (!) 97.3 F (36.3 C) (Oral)   Ht 5\' 2"  (1.575 m)   Wt 218 lb 6.4 oz (99.1 kg)   BMI 39.95 kg/m    Subjective:    Patient ID: Cynthia [name], female    DOB: 1965/05/09, 52 y.o.   MRN: [id number]  HPI: Cynthia [name] is a 53 y.o. female presenting on 01/15/2018 for Sinusitis  Patient with several days of progressing upper respiratory and bronchial symptoms. Initially there was more upper respiratory congestion. This progressed to having significant cough that is productive throughout the day and severe at night. There is occasional wheezing after coughing. Sometimes there is slight dyspnea on exertion. It is productive mucus that is yellow in color. Denies any blood. This patient has had many days of sinus headache and postnasal drainage. There is copious drainage at times. Denies any fever at this time. There has been a history of sinus infections in the past.  No history of sinus surgery. There is cough at night. It has become more prevalent in recent days.   Past Medical History:  Diagnosis Date  . High cholesterol   . Hypertension   . Migraine   . Vertigo    Relevant past medical, surgical, family and social history reviewed and updated as indicated. Interim medical history since our last visit reviewed. Allergies and medications reviewed and updated. DATA REVIEWED: CHART IN EPIC  Family History reviewed for pertinent findings.  Review of Systems  Constitutional: Positive for chills and fatigue. Negative for activity change, appetite change and fever.  HENT: Positive for congestion, postnasal drip, sinus pressure, sinus pain and sore throat.   Eyes: Negative.   Respiratory: Positive for cough, shortness of breath and wheezing.   Cardiovascular: Negative.  Negative for chest pain, palpitations and leg swelling.  Gastrointestinal: Negative.   Genitourinary: Negative.   Musculoskeletal: Negative.   Skin: Negative.   Neurological: Positive  for headaches.    Allergies as of 01/15/2018   No Known Allergies     Medication List        Accurate as of 01/15/18  1:47 PM. Always use your most recent med list.          albuterol 108 (90 Base) MCG/ACT inhaler Commonly known as:  PROVENTIL HFA;VENTOLIN HFA Inhale 2 puffs into the lungs every 6 (six) hours as needed for wheezing or shortness of breath.   amLODipine 10 MG tablet Commonly known as:  NORVASC Take 1 tablet (10 mg total) by mouth daily.   amoxicillin-clavulanate 875-125 MG tablet Commonly known as:  AUGMENTIN Take 1 tablet by mouth 2 (two) times daily.   budesonide-formoterol 160-4.5 MCG/ACT inhaler Commonly known as:  SYMBICORT Inhale 2 puffs into the lungs 2 (two) times daily. RINSE MOUTH   fluticasone 50 MCG/ACT nasal spray Commonly known as:  FLONASE Place 2 sprays into both nostrils daily.   hydrochlorothiazide 25 MG tablet Commonly known as:  HYDRODIURIL TAKE 1 TABLET(25 MG) BY MOUTH DAILY   loratadine 10 MG tablet Commonly known as:  CLARITIN Take 1 tablet (10 mg total) by mouth daily.   meclizine 25 MG tablet Commonly known as:  ANTIVERT TK 1 T PO TID PRN   montelukast 10 MG tablet Commonly known as:  SINGULAIR Take 1 tablet (10 mg total) by mouth at bedtime.   ondansetron 4 MG disintegrating tablet Commonly known as:  ZOFRAN-ODT Take 1 tablet (4 mg total) by mouth every 8 (eight) hours  as needed for nausea or vomiting.   trimethoprim-polymyxin b ophthalmic solution Commonly known as:  POLYTRIM Place 2 drops into both eyes every 4 (four) hours.          Objective:    BP 134/82   Pulse 95   Temp (!) 97.3 F (36.3 C) (Oral)   Ht 5\' 2"  (1.575 m)   Wt 218 lb 6.4 oz (99.1 kg)   BMI 39.95 kg/m   No Known Allergies  Wt Readings from Last 3 Encounters:  01/15/18 218 lb 6.4 oz (99.1 kg)  01/13/18 219 lb (99.3 kg)  07/17/17 219 lb 3.2 oz (99.4 kg)    Physical Exam  Constitutional: She is oriented to person, place, and time.  She appears well-developed and well-nourished.  HENT:  Head: Normocephalic and atraumatic.  Right Ear: Tympanic membrane and external ear normal. No middle ear effusion.  Left Ear: Tympanic membrane and external ear normal.  No middle ear effusion.  Nose: Mucosal edema and rhinorrhea present. Right sinus exhibits no maxillary sinus tenderness. Left sinus exhibits no maxillary sinus tenderness.  Mouth/Throat: Uvula is midline. Posterior oropharyngeal erythema present.  Eyes: Pupils are equal, round, and reactive to light. Conjunctivae and EOM are normal. Right eye exhibits no discharge. Left eye exhibits no discharge.  Neck: Normal range of motion.  Cardiovascular: Normal rate, regular rhythm and normal heart sounds.  Pulmonary/Chest: Effort normal and breath sounds normal. No respiratory distress. She has no wheezes.  Abdominal: Soft.  Lymphadenopathy:    She has no cervical adenopathy.  Neurological: She is alert and oriented to person, place, and time.  Skin: Skin is warm and dry.  Psychiatric: She has a normal mood and affect.    Results for orders placed or performed in visit on 05/08/17  Veritor Flu A/B Waived  Result Value Ref Range   Influenza A Negative Negative   Influenza B Negative Negative      Assessment & Plan:   1. Cough - albuterol (PROVENTIL HFA;VENTOLIN HFA) 108 (90 Base) MCG/ACT inhaler; Inhale 2 puffs into the lungs every 6 (six) hours as needed for wheezing or shortness of breath.  Dispense: 1 Inhaler; Refill: 11 - methylPREDNISolone acetate (DEPO-MEDROL) injection 80 mg  2. Bronchospasm - budesonide-formoterol (SYMBICORT) 160-4.5 MCG/ACT inhaler; Inhale 2 puffs into the lungs 2 (two) times daily. RINSE MOUTH  Dispense: 1 Inhaler; Refill: 11 - methylPREDNISolone acetate (DEPO-MEDROL) injection 80 mg  3. Allergic rhinitis due to pollen, unspecified seasonality - montelukast (SINGULAIR) 10 MG tablet; Take 1 tablet (10 mg total) by mouth at bedtime.  Dispense:  90 tablet; Refill: 3 - loratadine (CLARITIN) 10 MG tablet; Take 1 tablet (10 mg total) by mouth daily.  Dispense: 90 tablet; Refill: 3 - fluticasone (FLONASE) 50 MCG/ACT nasal spray; Place 2 sprays into both nostrils daily.  Dispense: 16 g; Refill: 11 - methylPREDNISolone acetate (DEPO-MEDROL) injection 80 mg  4. Acute recurrent frontal sinusitis - amoxicillin-clavulanate (AUGMENTIN) 875-125 MG tablet; Take 1 tablet by mouth 2 (two) times daily.  Dispense: 20 tablet; Refill: 0  5. Mild intermittent reactive airway disease with acute exacerbation - albuterol (PROVENTIL HFA;VENTOLIN HFA) 108 (90 Base) MCG/ACT inhaler; Inhale 2 puffs into the lungs every 6 (six) hours as needed for wheezing or shortness of breath.  Dispense: 1 Inhaler; Refill: 11 - budesonide-formoterol (SYMBICORT) 160-4.5 MCG/ACT inhaler; Inhale 2 puffs into the lungs 2 (two) times daily. RINSE MOUTH  Dispense: 1 Inhaler; Refill: 11   Continue all other maintenance medications as listed above.  Follow up plan: Return if symptoms worsen or fail to improve.  Educational handout given for [place] PA-C [facility] [address]  [place], [place] [address] [phone]   01/15/2018, 1:47 PM

## 2018-01-16 ENCOUNTER — Telehealth: Payer: Self-pay | Admitting: Physician Assistant

## 2018-01-17 NOTE — Telephone Encounter (Signed)
[facility] out of work 10/8 - 10/13. Original FMLA at front desk for pickup

## 2018-01-23 ENCOUNTER — Other Ambulatory Visit: Payer: Self-pay | Admitting: Nurse Practitioner

## 2018-01-23 DIAGNOSIS — I10 Essential (primary) hypertension: Secondary | ICD-10-CM

## 2018-02-07 ENCOUNTER — Encounter: Payer: BLUE CROSS/BLUE SHIELD | Admitting: Physician Assistant

## 2018-02-19 ENCOUNTER — Other Ambulatory Visit: Payer: Self-pay | Admitting: Physician Assistant

## 2018-02-19 ENCOUNTER — Other Ambulatory Visit: Payer: Self-pay | Admitting: Nurse Practitioner

## 2018-02-19 DIAGNOSIS — I10 Essential (primary) hypertension: Secondary | ICD-10-CM

## 2018-02-26 ENCOUNTER — Encounter: Payer: BLUE CROSS/BLUE SHIELD | Admitting: Physician Assistant

## 2018-03-20 ENCOUNTER — Other Ambulatory Visit: Payer: Self-pay | Admitting: Physician Assistant

## 2018-03-20 DIAGNOSIS — I10 Essential (primary) hypertension: Secondary | ICD-10-CM

## 2018-03-24 ENCOUNTER — Ambulatory Visit (INDEPENDENT_AMBULATORY_CARE_PROVIDER_SITE_OTHER): Payer: BLUE CROSS/BLUE SHIELD | Admitting: Physician Assistant

## 2018-03-24 ENCOUNTER — Encounter: Payer: Self-pay | Admitting: Physician Assistant

## 2018-03-24 DIAGNOSIS — I10 Essential (primary) hypertension: Secondary | ICD-10-CM | POA: Diagnosis not present

## 2018-03-24 MED ORDER — AMLODIPINE BESYLATE 10 MG PO TABS: ORAL_TABLET | ORAL | 3 refills | Status: DC

## 2018-03-24 MED ORDER — HYDROCHLOROTHIAZIDE 25 MG PO TABS: ORAL_TABLET | ORAL | 0 refills | Status: DC

## 2018-03-24 NOTE — Patient Instructions (Signed)
DASH Eating Plan DASH stands for "Dietary Approaches to Stop Hypertension." The DASH eating plan is a healthy eating plan that has been shown to reduce high blood pressure (hypertension). It may also reduce your risk for type 2 diabetes, heart disease, and stroke. The DASH eating plan may also help with weight loss. What are tips for following this plan? General guidelines  Avoid eating more than 2,300 mg (milligrams) of salt (sodium) a day. If you have hypertension, you may need to reduce your sodium intake to 1,500 mg a day.  Limit alcohol intake to no more than 1 drink a day for nonpregnant women and 2 drinks a day for men. One drink equals 12 oz of beer, 5 oz of wine, or 1 oz of hard liquor.  Work with your health care provider to maintain a healthy body weight or to lose weight. Ask what an ideal weight is for you.  Get at least 30 minutes of exercise that causes your heart to beat faster (aerobic exercise) most days of the week. Activities may include walking, swimming, or biking.  Work with your health care provider or diet and nutrition specialist (dietitian) to adjust your eating plan to your individual calorie needs. Reading food labels  Check food labels for the amount of sodium per serving. Choose foods with less than 5 percent of the Daily Value of sodium. Generally, foods with less than 300 mg of sodium per serving fit into this eating plan.  To find whole grains, look for the word "whole" as the first word in the ingredient list. Shopping  Buy products labeled as "low-sodium" or "no salt added."  Buy fresh foods. Avoid canned foods and premade or frozen meals. Cooking  Avoid adding salt when cooking. Use salt-free seasonings or herbs instead of table salt or sea salt. Check with your health care provider or pharmacist before using salt substitutes.  Do not fry foods. Cook foods using healthy methods such as baking, boiling, grilling, and broiling instead.  Cook with  heart-healthy oils, such as olive, canola, soybean, or sunflower oil. Meal planning   Eat a balanced diet that includes: ? 5 or more servings of fruits and vegetables each day. At each meal, try to fill half of your plate with fruits and vegetables. ? Up to 6-8 servings of whole grains each day. ? Less than 6 oz of lean meat, poultry, or fish each day. A 3-oz serving of meat is about the same size as a deck of cards. One egg equals 1 oz. ? 2 servings of low-fat dairy each day. ? A serving of nuts, seeds, or beans 5 times each week. ? Heart-healthy fats. Healthy fats called Omega-3 fatty acids are found in foods such as flaxseeds and coldwater fish, like sardines, salmon, and mackerel.  Limit how much you eat of the following: ? Canned or prepackaged foods. ? Food that is high in trans fat, such as fried foods. ? Food that is high in saturated fat, such as fatty meat. ? Sweets, desserts, sugary drinks, and other foods with added sugar. ? Full-fat dairy products.  Do not salt foods before eating.  Try to eat at least 2 vegetarian meals each week.  Eat more home-cooked food and less restaurant, buffet, and fast food.  When eating at a restaurant, ask that your food be prepared with less salt or no salt, if possible. What foods are recommended? The items listed may not be a complete list. Talk with your dietitian about what   dietary choices are best for you. Grains Whole-grain or whole-wheat bread. Whole-grain or whole-wheat pasta. Brown rice. Oatmeal. Quinoa. Bulgur. Whole-grain and low-sodium cereals. Pita bread. Low-fat, low-sodium crackers. Whole-wheat flour tortillas. Vegetables Fresh or frozen vegetables (raw, steamed, roasted, or grilled). Low-sodium or reduced-sodium tomato and vegetable juice. Low-sodium or reduced-sodium tomato sauce and tomato paste. Low-sodium or reduced-sodium canned vegetables. Fruits All fresh, dried, or frozen fruit. Canned fruit in natural juice (without  added sugar). Meat and other protein foods Skinless chicken or turkey. Ground chicken or turkey. Pork with fat trimmed off. Fish and seafood. Egg whites. Dried beans, peas, or lentils. Unsalted nuts, nut butters, and seeds. Unsalted canned beans. Lean cuts of beef with fat trimmed off. Low-sodium, lean deli meat. Dairy Low-fat (1%) or fat-free (skim) milk. Fat-free, low-fat, or reduced-fat cheeses. Nonfat, low-sodium ricotta or cottage cheese. Low-fat or nonfat yogurt. Low-fat, low-sodium cheese. Fats and oils Soft margarine without trans fats. Vegetable oil. Low-fat, reduced-fat, or light mayonnaise and salad dressings (reduced-sodium). Canola, safflower, olive, soybean, and sunflower oils. Avocado. Seasoning and other foods Herbs. Spices. Seasoning mixes without salt. Unsalted popcorn and pretzels. Fat-free sweets. What foods are not recommended? The items listed may not be a complete list. Talk with your dietitian about what dietary choices are best for you. Grains Baked goods made with fat, such as croissants, muffins, or some breads. Dry pasta or rice meal packs. Vegetables Creamed or fried vegetables. Vegetables in a cheese sauce. Regular canned vegetables (not low-sodium or reduced-sodium). Regular canned tomato sauce and paste (not low-sodium or reduced-sodium). Regular tomato and vegetable juice (not low-sodium or reduced-sodium). Pickles. Olives. Fruits Canned fruit in a light or heavy syrup. Fried fruit. Fruit in cream or butter sauce. Meat and other protein foods Fatty cuts of meat. Ribs. Fried meat. Bacon. Sausage. Bologna and other processed lunch meats. Salami. Fatback. Hotdogs. Bratwurst. Salted nuts and seeds. Canned beans with added salt. Canned or smoked fish. Whole eggs or egg yolks. Chicken or turkey with skin. Dairy Whole or 2% milk, cream, and half-and-half. Whole or full-fat cream cheese. Whole-fat or sweetened yogurt. Full-fat cheese. Nondairy creamers. Whipped toppings.  Processed cheese and cheese spreads. Fats and oils Butter. Stick margarine. Lard. Shortening. Ghee. Bacon fat. Tropical oils, such as coconut, palm kernel, or palm oil. Seasoning and other foods Salted popcorn and pretzels. Onion salt, garlic salt, seasoned salt, table salt, and sea salt. Worcestershire sauce. Tartar sauce. Barbecue sauce. Teriyaki sauce. Soy sauce, including reduced-sodium. Steak sauce. Canned and packaged gravies. Fish sauce. Oyster sauce. Cocktail sauce. Horseradish that you find on the shelf. Ketchup. Mustard. Meat flavorings and tenderizers. Bouillon cubes. Hot sauce and Tabasco sauce. Premade or packaged marinades. Premade or packaged taco seasonings. Relishes. Regular salad dressings. Where to find more information:  National Heart, Lung, and Blood Institute: www.nhlbi.nih.gov  American Heart Association: www.heart.org Summary  The DASH eating plan is a healthy eating plan that has been shown to reduce high blood pressure (hypertension). It may also reduce your risk for type 2 diabetes, heart disease, and stroke.  With the DASH eating plan, you should limit salt (sodium) intake to 2,300 mg a day. If you have hypertension, you may need to reduce your sodium intake to 1,500 mg a day.  When on the DASH eating plan, aim to eat more fresh fruits and vegetables, whole grains, lean proteins, low-fat dairy, and heart-healthy fats.  Work with your health care provider or diet and nutrition specialist (dietitian) to adjust your eating plan to your individual   calorie needs. This information is not intended to replace advice given to you by your health care provider. Make sure you discuss any questions you have with your health care provider. Document Released: 03/15/2011 Document Revised: 03/19/2016 Document Reviewed: 03/19/2016 Elsevier Interactive Patient Education  2018 [employer]  

## 2018-03-25 NOTE — Progress Notes (Signed)
BP 134/80   Pulse 90   Ht 5\' 2"  (1.575 m)   Wt 219 lb (99.3 kg)   BMI 40.06 kg/m    Subjective:    Patient ID: MACON LESESNE, female    DOB: Nov 16, 1965, 52 y.o.   MRN: [id number]  HPI: SKYANNE WELLE is a 52 y.o. female presenting on 03/24/2018 for Hypertension  This patient comes in for periodic recheck on medications and conditions including hypertension.   All medications are reviewed today. There are no reports of any problems with the medications. All of the medical conditions are reviewed and updated.  Lab work is reviewed and will be ordered as medically necessary. There are no new problems reported with today's visit.   Past Medical History:  Diagnosis Date  . High cholesterol   . Hypertension   . Migraine   . Vertigo    Relevant past medical, surgical, family and social history reviewed and updated as indicated. Interim medical history since our last visit reviewed. Allergies and medications reviewed and updated. DATA REVIEWED: CHART IN EPIC  Family History reviewed for pertinent findings.  Review of Systems  Constitutional: Negative.  Negative for activity change, fatigue and fever.  HENT: Negative.   Eyes: Negative.   Respiratory: Negative.  Negative for cough.   Cardiovascular: Negative.  Negative for chest pain.  Gastrointestinal: Negative.  Negative for abdominal pain.  Endocrine: Negative.   Genitourinary: Negative.  Negative for dysuria.  Musculoskeletal: Negative.   Skin: Negative.   Neurological: Negative.     Allergies as of 03/24/2018   No Known Allergies     Medication List       Accurate as of March 24, 2018 11:59 PM. Always use your most recent med list.        albuterol 108 (90 Base) MCG/ACT inhaler Commonly known as:  PROVENTIL HFA;VENTOLIN HFA Inhale 2 puffs into the lungs every 6 (six) hours as needed for wheezing or shortness of breath.   amLODipine 10 MG tablet Commonly known as:  NORVASC TAKE 1 TABLET(10 MG) BY  MOUTH DAILY   budesonide-formoterol 160-4.5 MCG/ACT inhaler Commonly known as:  SYMBICORT Inhale 2 puffs into the lungs 2 (two) times daily. RINSE MOUTH   fluticasone 50 MCG/ACT nasal spray Commonly known as:  FLONASE Place 2 sprays into both nostrils daily.   hydrochlorothiazide 25 MG tablet Commonly known as:  HYDRODIURIL Take one daily   loratadine 10 MG tablet Commonly known as:  CLARITIN Take 1 tablet (10 mg total) by mouth daily.   meclizine 25 MG tablet Commonly known as:  ANTIVERT TK 1 T PO TID PRN   montelukast 10 MG tablet Commonly known as:  SINGULAIR Take 1 tablet (10 mg total) by mouth at bedtime.   ondansetron 4 MG disintegrating tablet Commonly known as:  ZOFRAN ODT Take 1 tablet (4 mg total) by mouth every 8 (eight) hours as needed for nausea or vomiting.          Objective:    BP 134/80   Pulse 90   Ht 5\' 2"  (1.575 m)   Wt 219 lb (99.3 kg)   BMI 40.06 kg/m   No Known Allergies  Wt Readings from Last 3 Encounters:  03/24/18 219 lb (99.3 kg)  01/15/18 218 lb 6.4 oz (99.1 kg)  01/13/18 219 lb (99.3 kg)    Physical Exam Constitutional:      Appearance: She is well-developed.  HENT:     Head: Normocephalic and atraumatic.  Eyes:  Conjunctiva/sclera: Conjunctivae normal.     Pupils: Pupils are equal, round, and reactive to light.  Cardiovascular:     Rate and Rhythm: Normal rate and regular rhythm.     Heart sounds: Normal heart sounds.  Pulmonary:     Effort: Pulmonary effort is normal.     Breath sounds: Normal breath sounds.  Abdominal:     General: Bowel sounds are normal.     Palpations: Abdomen is soft.  Skin:    General: Skin is warm and dry.     Findings: No rash.  Neurological:     Mental Status: She is alert and oriented to person, place, and time.     Deep Tendon Reflexes: Reflexes are normal and symmetric.  Psychiatric:        Behavior: Behavior normal.        Thought Content: Thought content normal.        Judgment:  Judgment normal.     Results for orders placed or performed in visit on 05/08/17  Veritor Flu A/B Waived  Result Value Ref Range   Influenza A Negative Negative   Influenza B Negative Negative      Assessment & Plan:   1. Essential hypertension - amLODipine (NORVASC) 10 MG tablet; TAKE 1 TABLET(10 MG) BY MOUTH DAILY  Dispense: 90 tablet; Refill: 3 - hydrochlorothiazide (HYDRODIURIL) 25 MG tablet; Take one daily  Dispense: 30 tablet; Refill: 0   Continue all other maintenance medications as listed above.  Follow up plan: Recheck  1 year  Educational handout given for [place] PA-C [place] [address]  [place], [place] [address] [phone]   03/25/2018, 10:15 AM

## 2018-04-22 ENCOUNTER — Other Ambulatory Visit: Payer: Self-pay | Admitting: Physician Assistant

## 2018-04-22 DIAGNOSIS — I10 Essential (primary) hypertension: Secondary | ICD-10-CM

## 2018-05-05 ENCOUNTER — Ambulatory Visit (INDEPENDENT_AMBULATORY_CARE_PROVIDER_SITE_OTHER): Payer: BLUE CROSS/BLUE SHIELD | Admitting: Nurse Practitioner

## 2018-05-05 ENCOUNTER — Encounter: Payer: Self-pay | Admitting: Nurse Practitioner

## 2018-05-05 ENCOUNTER — Ambulatory Visit (INDEPENDENT_AMBULATORY_CARE_PROVIDER_SITE_OTHER): Payer: BLUE CROSS/BLUE SHIELD

## 2018-05-05 VITALS — BP 129/85 | HR 81 | Temp 96.8°F | Ht 62.0 in | Wt 221.0 lb

## 2018-05-05 DIAGNOSIS — M25561 Pain in right knee: Secondary | ICD-10-CM | POA: Diagnosis not present

## 2018-05-05 DIAGNOSIS — J0100 Acute maxillary sinusitis, unspecified: Secondary | ICD-10-CM

## 2018-05-05 MED ORDER — AMOXICILLIN-POT CLAVULANATE 875-125 MG PO TABS: 1.0000 | ORAL_TABLET | Freq: Two times a day (BID) | ORAL | 0 refills | Status: DC

## 2018-05-05 MED ORDER — NAPROXEN 500 MG PO TABS: 500.0000 mg | ORAL_TABLET | Freq: Two times a day (BID) | ORAL | 1 refills | Status: DC

## 2018-05-05 NOTE — Patient Instructions (Signed)
1. Take meds as prescribed 2. Use a cool mist humidifier especially during the winter months and when heat has been humid. 3. Use saline nose sprays frequently 4. Saline irrigations of the nose can be very helpful if done frequently.  * 4X daily for 1 week*  * Use of a nettie pot can be helpful with this. Follow directions with this* 5. Drink plenty of fluids 6. Keep thermostat turn down low 7.For any cough or congestion  Use plain Mucinex- regular strength or max strength is fine   * Children- consult with Pharmacist for dosing 8. For fever or aces or pains- take tylenol or ibuprofen appropriate for age and weight.  * for fevers greater than 101 orally you may alternate ibuprofen and tylenol every  3 hours.       RICE Therapy for Routine Care of Injuries Many injuries can be cared for with rest, ice, compression, and elevation (RICE therapy). This includes:  Resting the injured part.  Putting ice on the injury.  Putting pressure (compression) on the injury.  Raising the injured part (elevation). Using RICE therapy can help to lessen pain and swelling. Supplies needed:  Ice.  Plastic bag.  Towel.  Elastic bandage.  Pillow or pillows to raise (elevate) your injured body part. How to care for your injury with RICE therapy Rest Limit your normal activities, and try not to use the injured part of your body. You can go back to your normal activities when your doctor says it is okay to do them and you feel okay. Ask your doctor if you should do exercises to help your injury get better. Ice Put ice on the injured area. Do not put ice on your bare skin.  Put ice in a plastic bag.  Place a towel between your skin and the bag.  Leave the ice on for 20 minutes, 2-3 times a day. Use ice on as many days as told by your doctor.  Compression Compression means putting pressure on the injured area. This can be done with an elastic bandage. If an elastic bandage has been put on  your injury:  Do not wrap the bandage too tight. Wrap the bandage more loosely if part of your body away from the bandage is blue, swollen, cold, painful, or loses feeling (gets numb).  Take off the bandage and put it on again. Do this every 3-4 hours or as told by your doctor.  See your doctor if the bandage seems to make your problems worse.  Elevation Elevation means keeping the injured area raised. If you can, raise the injured area above your heart or the center of your chest. Contact a doctor if:  You keep having pain and swelling.  Your symptoms get worse. Get help right away if:  You have sudden bad pain at your injury or lower than your injury.  You have redness or more swelling around your injury.  You have tingling or numbness at your injury or lower than your injury, and it does not go away when you take off the bandage. Summary  Many injuries can be cared for using rest, ice, compression, and elevation (RICE therapy).  You can go back to your normal activities when you feel okay and your doctor says it is okay.  Put ice on the injured area as told by your doctor.  Get help if your symptoms get worse or if you keep having pain and swelling. This information is not intended to replace advice given  to you by your health care provider. Make sure you discuss any questions you have with your health care provider. Document Released: 09/12/2007 Document Revised: 12/14/2016 Document Reviewed: 12/14/2016 Elsevier Interactive Patient Education  2019 [employer].

## 2018-05-05 NOTE — Progress Notes (Signed)
Subjective:    Patient ID: Cynthia [name], female    DOB: 1965/10/26, 53 y.o.   MRN: [id number]   Chief Complaint: Sinus Problem and Knee Pain (Right)   HPI Patient comes in today c/o: - patient is c/o right knee pain. Started 2 weeks ago. Right now she rates pain 7/10. Pain increases with walking or standing. She has been wearing a knee brace and rubbing ointment on it and that has helped. Denies injury. - she is also c/o nasal congestion with nose bleeds. Cough. denies fever. This started about 2 weeks ago. She has been taking allkaseltzer cold and sinus.   Review of Systems  Constitutional: Negative for appetite change, chills and fever.  HENT: Positive for congestion, rhinorrhea and sinus pain. Negative for ear pain, sore throat and trouble swallowing.   Respiratory: Positive for cough (productive).   Cardiovascular: Negative.   Gastrointestinal: Negative.   Musculoskeletal: Positive for arthralgias (right knee).  Neurological: Positive for headaches.  Psychiatric/Behavioral: Negative.   All other systems reviewed and are negative.      Objective:   Physical Exam Vitals signs and nursing note reviewed.  Constitutional:      General: She is not in acute distress.    Appearance: Normal appearance. She is normal weight.  HENT:     Right Ear: Tympanic membrane, ear canal and external ear normal.     Left Ear: Tympanic membrane, ear canal and external ear normal.     Nose: Mucosal edema, congestion and rhinorrhea present.     Right Sinus: Maxillary sinus tenderness present.     Left Sinus: Maxillary sinus tenderness present.  Eyes:     Extraocular Movements: Extraocular movements intact.     Pupils: Pupils are equal, round, and reactive to light.  Neck:     Musculoskeletal: Normal range of motion.  Cardiovascular:     Rate and Rhythm: Normal rate and regular rhythm.     Pulses: Normal pulses.     Heart sounds: Normal heart sounds.  Pulmonary:     Breath sounds:  Normal breath sounds.  Musculoskeletal:     Comments: Pain on palpation lateral knee  No patella tenderness FROM with pain on full extension No crepitus Mild effusion All ligaments intact   Lymphadenopathy:     Cervical: No cervical adenopathy.  Skin:    General: Skin is warm.  Neurological:     General: No focal deficit present.     Mental Status: She is alert and oriented to person, place, and time.  Psychiatric:        Mood and Affect: Mood normal.        Behavior: Behavior normal.    BP 129/85   Pulse 81   Temp (!) 96.8 F (36 C) (Oral)   Ht 5\' 2"  (1.575 m)   Wt 221 lb (100.2 kg)   BMI 40.42 kg/m   Knee xray normal      Assessment & Plan:  [name] in today with chief complaint of Sinus Problem and Knee Pain (Right)   1. Acute pain of right knee Rest Ice bid  Compression wrap when up walking around elevate when sitting - DG Knee 1-2 Views Right; Future - naproxen (NAPROSYN) 500 MG tablet; Take 1 tablet (500 mg total) by mouth 2 (two) times daily with a meal.  Dispense: 60 tablet; Refill: 1  2. Acute maxillary sinusitis, recurrence not specified 1. Take meds as prescribed 2. Use a cool mist humidifier especially during  the winter months and when heat has been humid. 3. Use saline nose sprays frequently 4. Saline irrigations of the nose can be very helpful if done frequently.  * 4X daily for 1 week*  * Use of a nettie pot can be helpful with this. Follow directions with this* 5. Drink plenty of fluids 6. Keep thermostat turn down low 7.For any cough or congestion  Use plain Mucinex- regular strength or max strength is fine   * Children- consult with Pharmacist for dosing 8. For fever or aces or pains- take tylenol or ibuprofen appropriate for age and weight.  * for fevers greater than 101 orally you may alternate ibuprofen and tylenol every  3 hours.    - amoxicillin-clavulanate (AUGMENTIN) 875-125 MG tablet; Take 1 tablet by mouth 2 (two)  times daily.  Dispense: 14 tablet; Refill: 0  Mary-Margaret Hassell Done, FNP

## 2018-06-09 ENCOUNTER — Encounter: Payer: Self-pay | Admitting: Physician Assistant

## 2018-06-09 ENCOUNTER — Ambulatory Visit (INDEPENDENT_AMBULATORY_CARE_PROVIDER_SITE_OTHER): Payer: BLUE CROSS/BLUE SHIELD

## 2018-06-09 ENCOUNTER — Ambulatory Visit (INDEPENDENT_AMBULATORY_CARE_PROVIDER_SITE_OTHER): Payer: BLUE CROSS/BLUE SHIELD | Admitting: Physician Assistant

## 2018-06-09 VITALS — BP 137/84 | HR 101 | Temp 98.3°F | Ht 62.0 in | Wt 221.2 lb

## 2018-06-09 DIAGNOSIS — J4521 Mild intermittent asthma with (acute) exacerbation: Secondary | ICD-10-CM | POA: Diagnosis not present

## 2018-06-09 DIAGNOSIS — J301 Allergic rhinitis due to pollen: Secondary | ICD-10-CM | POA: Diagnosis not present

## 2018-06-09 DIAGNOSIS — R059 Cough, unspecified: Secondary | ICD-10-CM

## 2018-06-09 DIAGNOSIS — J209 Acute bronchitis, unspecified: Secondary | ICD-10-CM | POA: Insufficient documentation

## 2018-06-09 DIAGNOSIS — R05 Cough: Secondary | ICD-10-CM

## 2018-06-09 MED ORDER — FLUTICASONE PROPIONATE 50 MCG/ACT NA SUSP: 2.0000 | Freq: Every day | NASAL | 11 refills | Status: DC

## 2018-06-09 MED ORDER — DOXYCYCLINE HYCLATE 100 MG PO TABS: 100.0000 mg | ORAL_TABLET | Freq: Two times a day (BID) | ORAL | 0 refills | Status: DC

## 2018-06-09 MED ORDER — METHYLPREDNISOLONE ACETATE 80 MG/ML IJ SUSP
80.0000 mg | Freq: Once | INTRAMUSCULAR | Status: AC
  Administered 2018-06-09: 80 mg via INTRAMUSCULAR

## 2018-06-09 MED ORDER — ALBUTEROL SULFATE HFA 108 (90 BASE) MCG/ACT IN AERS: 2.0000 | INHALATION_SPRAY | Freq: Four times a day (QID) | RESPIRATORY_TRACT | 11 refills | Status: DC | PRN

## 2018-06-09 MED ORDER — LORATADINE 10 MG PO TABS: 10.0000 mg | ORAL_TABLET | Freq: Every day | ORAL | 3 refills | Status: DC

## 2018-06-09 NOTE — Progress Notes (Signed)
BP 137/84   Pulse (!) 101   Temp 98.3 F (36.8 C) (Oral)   Ht 5\' 2"  (1.575 m)   Wt 221 lb 3.2 oz (100.3 kg)   SpO2 96%   BMI 40.46 kg/m    Subjective:    Patient ID: Cynthia [name], female    DOB: 26-Oct-1965, 53 y.o.   MRN: [id number]  HPI: Cynthia [name] is a 53 y.o. female presenting on 06/09/2018 for Cough (This weekend she also coughed up blood a couple time ); Fatigue; and Shortness of Breath  Patient with several days of progressing upper respiratory and bronchial symptoms. Initially there was more upper respiratory congestion. This progressed to having significant cough that is productive throughout the day and severe at night. There is occasional wheezing after coughing. Sometimes there is slight dyspnea on exertion. It is productive mucus that is yellow in color. She saw blood in the sputum yesterday.   Past Medical History:  Diagnosis Date  . High cholesterol   . Hypertension   . Migraine   . Vertigo    Relevant past medical, surgical, family and social history reviewed and updated as indicated. Interim medical history since our last visit reviewed. Allergies and medications reviewed and updated. DATA REVIEWED: CHART IN EPIC  Family History reviewed for pertinent findings.  Review of Systems  Constitutional: Positive for chills, fatigue and fever. Negative for activity change and appetite change.  HENT: Positive for congestion, postnasal drip and sore throat.   Eyes: Negative.   Respiratory: Positive for cough, shortness of breath and wheezing.   Cardiovascular: Negative.  Negative for chest pain, palpitations and leg swelling.  Gastrointestinal: Negative.   Genitourinary: Negative.   Musculoskeletal: Negative.   Skin: Negative.   Neurological: Positive for headaches.    Allergies as of 06/09/2018   No Known Allergies     Medication List       Accurate as of June 09, 2018  8:15 PM. Always use your most recent med list.        albuterol 108  (90 Base) MCG/ACT inhaler Commonly known as:  PROVENTIL HFA;VENTOLIN HFA Inhale 2 puffs into the lungs every 6 (six) hours as needed for wheezing or shortness of breath.   amLODipine 10 MG tablet Commonly known as:  NORVASC TAKE 1 TABLET(10 MG) BY MOUTH DAILY   budesonide-formoterol 160-4.5 MCG/ACT inhaler Commonly known as:  SYMBICORT Inhale 2 puffs into the lungs 2 (two) times daily. RINSE MOUTH   doxycycline 100 MG tablet Commonly known as:  VIBRA-TABS Take 1 tablet (100 mg total) by mouth 2 (two) times daily. 1 po bid   fluticasone 50 MCG/ACT nasal spray Commonly known as:  FLONASE Place 2 sprays into both nostrils daily.   hydrochlorothiazide 25 MG tablet Commonly known as:  HYDRODIURIL TAKE 1 TABLET BY MOUTH DAILY   loratadine 10 MG tablet Commonly known as:  CLARITIN Take 1 tablet (10 mg total) by mouth daily.   meclizine 25 MG tablet Commonly known as:  ANTIVERT TK 1 T PO TID PRN   montelukast 10 MG tablet Commonly known as:  SINGULAIR Take 1 tablet (10 mg total) by mouth at bedtime.   ondansetron 4 MG disintegrating tablet Commonly known as:  ZOFRAN ODT Take 1 tablet (4 mg total) by mouth every 8 (eight) hours as needed for nausea or vomiting.          Objective:    BP 137/84   Pulse (!) 101   Temp  98.3 F (36.8 C) (Oral)   Ht 5\' 2"  (1.575 m)   Wt 221 lb 3.2 oz (100.3 kg)   SpO2 96%   BMI 40.46 kg/m   No Known Allergies  Wt Readings from Last 3 Encounters:  06/09/18 221 lb 3.2 oz (100.3 kg)  05/05/18 221 lb (100.2 kg)  03/24/18 219 lb (99.3 kg)    Physical Exam Constitutional:      Appearance: She is well-developed.  HENT:     Head: Normocephalic and atraumatic.     Right Ear: Drainage and tenderness present.     Left Ear: Drainage and tenderness present.     Nose: Mucosal edema and rhinorrhea present.     Right Sinus: No maxillary sinus tenderness or frontal sinus tenderness.     Left Sinus: No maxillary sinus tenderness or frontal  sinus tenderness.     Mouth/Throat:     Pharynx: Oropharyngeal exudate and posterior oropharyngeal erythema present.  Eyes:     Conjunctiva/sclera: Conjunctivae normal.     Pupils: Pupils are equal, round, and reactive to light.  Neck:     Musculoskeletal: Normal range of motion and neck supple.  Cardiovascular:     Rate and Rhythm: Normal rate and regular rhythm.     Heart sounds: Normal heart sounds.  Pulmonary:     Effort: Pulmonary effort is normal.     Breath sounds: Examination of the right-upper field reveals wheezing. Examination of the left-upper field reveals wheezing. Wheezing present.  Abdominal:     General: Bowel sounds are normal.     Palpations: Abdomen is soft.  Skin:    General: Skin is warm and dry.     Findings: No rash.  Neurological:     Mental Status: She is alert and oriented to person, place, and time.     Deep Tendon Reflexes: Reflexes are normal and symmetric.  Psychiatric:        Behavior: Behavior normal.        Thought Content: Thought content normal.        Judgment: Judgment normal.     Results for orders placed or performed in visit on 05/08/17  Veritor Flu A/B Waived  Result Value Ref Range   Influenza A Negative Negative   Influenza B Negative Negative      Assessment & Plan:   1. Mild intermittent reactive airway disease with acute exacerbation - albuterol (PROVENTIL HFA;VENTOLIN HFA) 108 (90 Base) MCG/ACT inhaler; Inhale 2 puffs into the lungs every 6 (six) hours as needed for wheezing or shortness of breath.  Dispense: 1 Inhaler; Refill: 11 - methylPREDNISolone acetate (DEPO-MEDROL) injection 80 mg - Ambulatory referral to Pulmonology - DG Chest 2 View; Future  2. Cough - albuterol (PROVENTIL HFA;VENTOLIN HFA) 108 (90 Base) MCG/ACT inhaler; Inhale 2 puffs into the lungs every 6 (six) hours as needed for wheezing or shortness of breath.  Dispense: 1 Inhaler; Refill: 11 - Ambulatory referral to Pulmonology - DG Chest 2 View;  Future  3. Allergic rhinitis due to pollen, unspecified seasonality - fluticasone (FLONASE) 50 MCG/ACT nasal spray; Place 2 sprays into both nostrils daily.  Dispense: 16 g; Refill: 11 - loratadine (CLARITIN) 10 MG tablet; Take 1 tablet (10 mg total) by mouth daily.  Dispense: 90 tablet; Refill: 3 - Ambulatory referral to Allergy  4. Bronchitis with bronchospasm - methylPREDNISolone acetate (DEPO-MEDROL) injection 80 mg - Ambulatory referral to Pulmonology  Patient reports that her FMLA has her for a few more days.  She has  been using all of the days.  If we can make an adjustment that she would miss 12 days/month due to her chronic medical illness.  Currently has 7 days/month.  Continue all other maintenance medications as listed above.  Follow up plan: No follow-ups on file.  Educational handout given for Franklin PA-C [facility] [address]  [place], [place] [address] [phone]   06/09/2018, 8:15 PM

## 2018-06-12 ENCOUNTER — Telehealth: Payer: Self-pay

## 2018-06-12 NOTE — Telephone Encounter (Signed)
Wants a referral to a lung specialist

## 2018-06-12 NOTE — Telephone Encounter (Signed)
This was placed on 06/09/18 to Dr. Melvyn Novas

## 2018-06-20 ENCOUNTER — Institutional Professional Consult (permissible substitution): Payer: PRIVATE HEALTH INSURANCE | Admitting: Pulmonary Disease

## 2018-07-01 ENCOUNTER — Telehealth (INDEPENDENT_AMBULATORY_CARE_PROVIDER_SITE_OTHER): Payer: BLUE CROSS/BLUE SHIELD | Admitting: Physician Assistant

## 2018-07-01 ENCOUNTER — Other Ambulatory Visit: Payer: Self-pay

## 2018-07-01 DIAGNOSIS — R05 Cough: Secondary | ICD-10-CM | POA: Diagnosis not present

## 2018-07-01 DIAGNOSIS — J301 Allergic rhinitis due to pollen: Secondary | ICD-10-CM

## 2018-07-01 DIAGNOSIS — J209 Acute bronchitis, unspecified: Secondary | ICD-10-CM

## 2018-07-01 DIAGNOSIS — R040 Epistaxis: Secondary | ICD-10-CM | POA: Diagnosis not present

## 2018-07-01 DIAGNOSIS — R059 Cough, unspecified: Secondary | ICD-10-CM

## 2018-07-01 NOTE — Progress Notes (Signed)
Telephone visit  Subjective: CC: Nosebleed and congestion, cough PCP: Terald Sleeper, PA-C Cynthia [name] is a 53 y.o. female calls for telephone consult today. Patient provides verbal consent for consult held via phone.  Location of patient: Home Location of provider: [facility] Others present for call: none  Patient has complaint of runny nose and congestion again.  She has had some bleeding in her right nose.  She was able to get it stopped.  She continues sometimes with cough and shortness of breath.  The patient does have asthma and allergic rhinitis.  She has an appointment April 1 with pulmonology.  They had to cancel the appointment recently.  In reviewing her medications she is using Flonase and taking it regularly of asked her to stop this medication at this time to see if this will help with a nosebleed.  I have offered an ear nose and throat evaluation if the bleeding continues.  She reports that she is using her Symbicort and albuterol on a very regular basis.  She did stay out of work today.    She was quite concerned about her lungs and the possibility of acquiring covered 19.  She would like to have a test done but I have instructed her that this is not available and only being used in hospital situations.  I reassured her that she is not in that position.  And to take precautions as she has been taught to do.   ROS: Per HPI  No Known Allergies Past Medical History:  Diagnosis Date  . High cholesterol   . Hypertension   . Migraine   . Vertigo     Current Outpatient Medications:  .  albuterol (PROVENTIL HFA;VENTOLIN HFA) 108 (90 Base) MCG/ACT inhaler, Inhale 2 puffs into the lungs every 6 (six) hours as needed for wheezing or shortness of breath., Disp: 1 Inhaler, Rfl: 11 .  amLODipine (NORVASC) 10 MG tablet, TAKE 1 TABLET(10 MG) BY MOUTH DAILY, Disp: 90 tablet, Rfl: 3 .  budesonide-formoterol (SYMBICORT) 160-4.5 MCG/ACT inhaler, Inhale 2 puffs into the lungs 2 (two)  times daily. RINSE MOUTH, Disp: 1 Inhaler, Rfl: 11 .  doxycycline (VIBRA-TABS) 100 MG tablet, Take 1 tablet (100 mg total) by mouth 2 (two) times daily. 1 po bid, Disp: 20 tablet, Rfl: 0 .  fluticasone (FLONASE) 50 MCG/ACT nasal spray, Place 2 sprays into both nostrils daily., Disp: 16 g, Rfl: 11 .  hydrochlorothiazide (HYDRODIURIL) 25 MG tablet, TAKE 1 TABLET BY MOUTH DAILY, Disp: 30 tablet, Rfl: 4 .  loratadine (CLARITIN) 10 MG tablet, Take 1 tablet (10 mg total) by mouth daily., Disp: 90 tablet, Rfl: 3 .  meclizine (ANTIVERT) 25 MG tablet, TK 1 T PO TID PRN, Disp: , Rfl: 0 .  montelukast (SINGULAIR) 10 MG tablet, Take 1 tablet (10 mg total) by mouth at bedtime., Disp: 90 tablet, Rfl: 3 .  ondansetron (ZOFRAN ODT) 4 MG disintegrating tablet, Take 1 tablet (4 mg total) by mouth every 8 (eight) hours as needed for nausea or vomiting., Disp: 20 tablet, Rfl: 0  Assessment/ Plan: 53 y.o. female   1. Allergic rhinitis due to pollen, unspecified seasonality Loratadine 10 mg 1 daily Montelukast 10 mg 1 daily   2. Cough Use inhalers as directed  3. Bronchitis with bronchospasm Finish doxycycline  4. Right-sided nosebleed Hold Flonase Call back if it does not resolve.   Start time: 11:28 End time: 11:35  No orders of the defined types were placed in this encounter.  Particia Nearing PA-C [facility] [phone]

## 2018-07-09 ENCOUNTER — Institutional Professional Consult (permissible substitution): Payer: PRIVATE HEALTH INSURANCE | Admitting: Pulmonary Disease

## 2018-09-10 ENCOUNTER — Other Ambulatory Visit: Payer: Self-pay

## 2018-09-10 ENCOUNTER — Ambulatory Visit: Payer: BLUE CROSS/BLUE SHIELD | Admitting: Family

## 2018-09-23 ENCOUNTER — Other Ambulatory Visit: Payer: Self-pay | Admitting: Physician Assistant

## 2018-09-23 DIAGNOSIS — I10 Essential (primary) hypertension: Secondary | ICD-10-CM

## 2018-09-29 ENCOUNTER — Other Ambulatory Visit: Payer: Self-pay

## 2018-09-29 ENCOUNTER — Ambulatory Visit (INDEPENDENT_AMBULATORY_CARE_PROVIDER_SITE_OTHER): Payer: BC Managed Care – PPO | Admitting: Family Medicine

## 2018-09-29 ENCOUNTER — Encounter: Payer: Self-pay | Admitting: Family Medicine

## 2018-09-29 DIAGNOSIS — J0100 Acute maxillary sinusitis, unspecified: Secondary | ICD-10-CM | POA: Diagnosis not present

## 2018-09-29 MED ORDER — PREDNISONE 20 MG PO TABS: ORAL_TABLET | ORAL | 0 refills | Status: DC

## 2018-09-29 MED ORDER — AMOXICILLIN-POT CLAVULANATE 875-125 MG PO TABS: 1.0000 | ORAL_TABLET | Freq: Two times a day (BID) | ORAL | 0 refills | Status: AC

## 2018-09-29 NOTE — Progress Notes (Signed)
Virtual Visit via telephone Note Due to COVID-19, visit is conducted virtually and was requested by patient. This visit type was conducted due to national recommendations for restrictions regarding the COVID-19 Pandemic (e.g. social distancing) in an effort to limit this patient's exposure and mitigate transmission in our community. All issues noted in this document were discussed and addressed.  A physical exam was not performed with this format.   I connected with Cynthia [name] on 09/29/18 at 1100 by telephone and verified that I am speaking with the correct person using two identifiers. Cynthia [name] is currently located at home and family is currently with them during visit. The provider, Monia Pouch, FNP is located in their office at time of visit.  I discussed the limitations, risks, security and privacy concerns of performing an evaluation and management service by telephone and the availability of in person appointments. I also discussed with the patient that there may be a patient responsible charge related to this service. The patient expressed understanding and agreed to proceed.  Subjective:  Patient ID: Cynthia [name], female    DOB: March 16, 1966, 53 y.o.   MRN: [id number]  Chief Complaint:  Sinus Problem   HPI: Cynthia [name] is a 53 y.o. female presenting on 09/29/2018 for Sinus Problem   Pt reports two weeks of sinus pressure, drainage, congestion, right ear pain, right upper teeth pain. Pt states she has been using over the counter medications without relief of the symptoms. Pt states she has a lot of pressure under her right eye and in her right ear. States she has had a low grade fever with chills. No cough, shortness of breath, chest pain, or confusion.   Sinus Problem This is a new problem. The current episode started 1 to 4 weeks ago. The problem has been gradually worsening since onset. The maximum temperature recorded prior to her arrival was 100.4 -  100.9 F. The fever has been present for 1 to 2 days. Her pain is at a severity of 5/10. The pain is moderate. Associated symptoms include chills, congestion, ear pain, headaches, sinus pressure and a sore throat. Pertinent negatives include no coughing, diaphoresis, hoarse voice, neck pain, shortness of breath, sneezing or swollen glands. Past treatments include acetaminophen, oral decongestants and spray decongestants. The treatment provided no relief.     Relevant past medical, surgical, family, and social history reviewed and updated as indicated.  Allergies and medications reviewed and updated.   Past Medical History:  Diagnosis Date  . High cholesterol   . Hypertension   . Migraine   . Vertigo     Past Surgical History:  Procedure Laterality Date  . ABDOMINAL HYSTERECTOMY    . BREAST EXCISIONAL BIOPSY    . BREAST SURGERY Left    lump removed   . CYST REMOVAL LEG    . FRACTURE SURGERY Right    Arm  . TONSILLECTOMY      Social History   Socioeconomic History  . Marital status: Married    Spouse name: Not on file  . Number of children: Not on file  . Years of education: Not on file  . Highest education level: Not on file  Occupational History  . Not on file  Social Needs  . Financial resource strain: Not on file  . Food insecurity    Worry: Not on file    Inability: Not on file  . Transportation needs    Medical: Not on file    Non-medical:  Not on file  Tobacco Use  . Smoking status: Current Some Day Smoker    Types: Cigarettes  . Smokeless tobacco: Never Used  . Tobacco comment: Maybe 1 cig a month  Substance and Sexual Activity  . Alcohol use: Yes    Comment: occ  . Drug use: No  . Sexual activity: Not on file    Comment: Married  Lifestyle  . Physical activity    Days per week: Not on file    Minutes per session: Not on file  . Stress: Not on file  Relationships  . Social Herbalist on phone: Not on file    Gets together: Not on file     Attends religious service: Not on file    Active member of club or organization: Not on file    Attends meetings of clubs or organizations: Not on file    Relationship status: Not on file  . Intimate partner violence    Fear of current or ex partner: Not on file    Emotionally abused: Not on file    Physically abused: Not on file    Forced sexual activity: Not on file  Other Topics Concern  . Not on file  Social History Narrative   Lives at home w/ her husband   Left-handed   Caffeine: 2-3 sodas per day    Outpatient Encounter Medications as of 09/29/2018  Medication Sig  . albuterol (PROVENTIL HFA;VENTOLIN HFA) 108 (90 Base) MCG/ACT inhaler Inhale 2 puffs into the lungs every 6 (six) hours as needed for wheezing or shortness of breath.  Marland Kitchen amLODipine (NORVASC) 10 MG tablet TAKE 1 TABLET(10 MG) BY MOUTH DAILY  . amoxicillin-clavulanate (AUGMENTIN) 875-125 MG tablet Take 1 tablet by mouth 2 (two) times daily for 7 days.  . budesonide-formoterol (SYMBICORT) 160-4.5 MCG/ACT inhaler Inhale 2 puffs into the lungs 2 (two) times daily. RINSE MOUTH  . hydrochlorothiazide (HYDRODIURIL) 25 MG tablet TAKE 1 TABLET BY MOUTH DAILY  . loratadine (CLARITIN) 10 MG tablet Take 1 tablet (10 mg total) by mouth daily.  . meclizine (ANTIVERT) 25 MG tablet TK 1 T PO TID PRN  . montelukast (SINGULAIR) 10 MG tablet Take 1 tablet (10 mg total) by mouth at bedtime.  . ondansetron (ZOFRAN ODT) 4 MG disintegrating tablet Take 1 tablet (4 mg total) by mouth every 8 (eight) hours as needed for nausea or vomiting.  . predniSONE (DELTASONE) 20 MG tablet 2 po at sametime daily for 5 days  . [DISCONTINUED] doxycycline (VIBRA-TABS) 100 MG tablet Take 1 tablet (100 mg total) by mouth 2 (two) times daily. 1 po bid   No facility-administered encounter medications on file as of 09/29/2018.     No Known Allergies  Review of Systems  Constitutional: Positive for chills, fatigue and fever. Negative for appetite change,  diaphoresis and unexpected weight change.  HENT: Positive for congestion, ear pain, postnasal drip, rhinorrhea, sinus pressure, sinus pain and sore throat. Negative for dental problem, drooling, ear discharge, facial swelling, hearing loss, hoarse voice, mouth sores, nosebleeds, sneezing, tinnitus, trouble swallowing and voice change.   Respiratory: Negative for cough, choking, chest tightness, shortness of breath and wheezing.   Cardiovascular: Negative for chest pain and palpitations.  Gastrointestinal: Negative for abdominal distention, diarrhea, nausea and vomiting.  Genitourinary: Negative for decreased urine volume and difficulty urinating.  Musculoskeletal: Negative for neck pain.  Skin: Negative for color change.  Neurological: Positive for headaches. Negative for dizziness, weakness and light-headedness.  Psychiatric/Behavioral: Negative  for confusion.  All other systems reviewed and are negative.        Observations/Objective: No vital signs or physical exam, this was a telephone or virtual health encounter.  Pt alert and oriented, answers all questions appropriately, and able to speak in full sentences.    Assessment and Plan: Kellyn was seen today for sinus problem.  Diagnoses and all orders for this visit:  Acute non-recurrent maxillary sinusitis Reported symptoms consistent with maxillary sinusitis. Due to length of symptoms and failed conservative therapy, will treat with below. Continue Flonase and saline nasal sprays. Increase water intake. Report any new or worsening symptoms. Medications as prescribed.  -     amoxicillin-clavulanate (AUGMENTIN) 875-125 MG tablet; Take 1 tablet by mouth 2 (two) times daily for 7 days. -     predniSONE (DELTASONE) 20 MG tablet; 2 po at sametime daily for 5 days     Follow Up Instructions: Return if symptoms worsen or fail to improve.    I discussed the assessment and treatment plan with the patient. The patient was provided an  opportunity to ask questions and all were answered. The patient agreed with the plan and demonstrated an understanding of the instructions.   The patient was advised to call back or seek an in-person evaluation if the symptoms worsen or if the condition fails to improve as anticipated.  The above assessment and management plan was discussed with the patient. The patient verbalized understanding of and has agreed to the management plan. Patient is aware to call the clinic if symptoms persist or worsen. Patient is aware when to return to the clinic for a follow-up visit. Patient educated on when it is appropriate to go to the emergency department.    I provided 15 minutes of non-face-to-face time during this encounter. The call started at 1100. The call ended at 1115. The other time was used for coordination of care.    Monia Pouch, FNP-C [facility] [address] [place], [place] [address] [phone]

## 2018-10-03 ENCOUNTER — Other Ambulatory Visit: Payer: Self-pay

## 2018-10-03 ENCOUNTER — Ambulatory Visit (INDEPENDENT_AMBULATORY_CARE_PROVIDER_SITE_OTHER): Payer: BC Managed Care – PPO | Admitting: Pulmonary Disease

## 2018-10-03 ENCOUNTER — Encounter: Payer: Self-pay | Admitting: Pulmonary Disease

## 2018-10-03 DIAGNOSIS — R0602 Shortness of breath: Secondary | ICD-10-CM

## 2018-10-03 MED ORDER — BECLOMETHASONE DIPROPIONATE 80 MCG/ACT IN AERS: 1.0000 | INHALATION_SPRAY | Freq: Two times a day (BID) | RESPIRATORY_TRACT | 5 refills | Status: DC

## 2018-10-03 MED ORDER — BECLOMETHASONE DIPROPIONATE 80 MCG/ACT IN AERS: 1.0000 | INHALATION_SPRAY | Freq: Two times a day (BID) | RESPIRATORY_TRACT | 0 refills | Status: DC

## 2018-10-03 MED ORDER — MONTELUKAST SODIUM 10 MG PO TABS: 10.0000 mg | ORAL_TABLET | Freq: Every day | ORAL | 11 refills | Status: DC

## 2018-10-03 NOTE — Addendum Note (Signed)
Addended by: Suzzanne Cloud E on: 10/03/2018 03:57 PM   Modules accepted: Orders

## 2018-10-03 NOTE — Patient Instructions (Signed)
We will get some labs today including CBC differential, IgE, d-dimer Schedule you for high-resolution CT of the chest and pulmonary function  Stop the Symbicort.  We will start you on Qvar inhaler We will start you on Singulair 10 mg daily Continue Flonase nasal spray Follow-up in 1 to 2 months.

## 2018-10-03 NOTE — Progress Notes (Signed)
Cynthia [name]    [id number]    Jun 30, 1965  Primary Care Physician:Jones, Londell Moh, PA-C  Referring Physician: Terald Sleeper, PA-C [place],  [place] [address]  Chief complaint: Consult for dyspnea  HPI: 53 year old with history of hypertension, hyperlipidemia, asthma Complains of dyspnea with wheezing, chest congestion for the past 3 months.  She was diagnosed with asthma many years ago.  Prescribed Symbicort but she is not taking it as it was this palpitations  Has significant issues with allergic rhinosinusitis, postnasal drip.  She is using Flonase occasionally for this.  No symptoms of acid reflux Reports a few episodes of hemoptysis last week which resolved spontaneously  Pets: No pets Occupation: Works at a [employer] Exposures: No known exposures.  She has a [place] which he uses couple of times a week but keeps it clean No humidifiers Smoking history: 5-pack-year smoker.  Quit in 2018 Travel history: Originally from [place].  No significant travel history Relevant family history: Strong family history of allergies, sinusitis, asthma.  Outpatient Encounter Medications as of 10/03/2018  Medication Sig  . albuterol (PROVENTIL HFA;VENTOLIN HFA) 108 (90 Base) MCG/ACT inhaler Inhale 2 puffs into the lungs every 6 (six) hours as needed for wheezing or shortness of breath.  Marland Kitchen amLODipine (NORVASC) 10 MG tablet TAKE 1 TABLET(10 MG) BY MOUTH DAILY  . amoxicillin-clavulanate (AUGMENTIN) 875-125 MG tablet Take 1 tablet by mouth 2 (two) times daily for 7 days.  . budesonide-formoterol (SYMBICORT) 160-4.5 MCG/ACT inhaler Inhale 2 puffs into the lungs 2 (two) times daily. RINSE MOUTH  . hydrochlorothiazide (HYDRODIURIL) 25 MG tablet TAKE 1 TABLET BY MOUTH DAILY  . meclizine (ANTIVERT) 25 MG tablet TK 1 T PO TID PRN  . montelukast (SINGULAIR) 10 MG tablet Take 1 tablet (10 mg total) by mouth at bedtime.  . ondansetron (ZOFRAN ODT) 4 MG  disintegrating tablet Take 1 tablet (4 mg total) by mouth every 8 (eight) hours as needed for nausea or vomiting.  . predniSONE (DELTASONE) 20 MG tablet 2 po at sametime daily for 5 days  . loratadine (CLARITIN) 10 MG tablet Take 1 tablet (10 mg total) by mouth daily. (Patient not taking: Reported on 10/03/2018)   No facility-administered encounter medications on file as of 10/03/2018.     Allergies as of 10/03/2018  . (No Known Allergies)    Past Medical History:  Diagnosis Date  . High cholesterol   . Hypertension   . Migraine   . Vertigo     Past Surgical History:  Procedure Laterality Date  . ABDOMINAL HYSTERECTOMY    . BREAST EXCISIONAL BIOPSY    . BREAST SURGERY Left    lump removed   . CYST REMOVAL LEG    . FRACTURE SURGERY Right    Arm  . TONSILLECTOMY      Family History  Problem Relation Age of Onset  . Heart failure Mother   . Hypertension Father   . Kidney disease Father   . Cancer Maternal Grandmother   . Cancer Maternal Aunt     Social History   Socioeconomic History  . Marital status: Married    Spouse name: Not on file  . Number of children: Not on file  . Years of education: Not on file  . Highest education level: Not on file  Occupational History  . Not on file  Social Needs  . Financial resource strain: Not on file  . Food insecurity  Worry: Not on file    Inability: Not on file  . Transportation needs    Medical: Not on file    Non-medical: Not on file  Tobacco Use  . Smoking status: Former Smoker    Packs/day: 0.50    Years: 10.00    Pack years: 5.00    Types: Cigarettes    Quit date: 06/04/2016    Years since quitting: 2.3  . Smokeless tobacco: Never Used  Substance and Sexual Activity  . Alcohol use: Yes    Comment: occ  . Drug use: No  . Sexual activity: Not on file    Comment: Married  Lifestyle  . Physical activity    Days per week: Not on file    Minutes per session: Not on file  . Stress: Not on file   Relationships  . Social Herbalist on phone: Not on file    Gets together: Not on file    Attends religious service: Not on file    Active member of club or organization: Not on file    Attends meetings of clubs or organizations: Not on file    Relationship status: Not on file  . Intimate partner violence    Fear of current or ex partner: Not on file    Emotionally abused: Not on file    Physically abused: Not on file    Forced sexual activity: Not on file  Other Topics Concern  . Not on file  Social History Narrative   Lives at home w/ her husband   Left-handed   Caffeine: 2-3 sodas per day    Review of systems: Review of Systems  Constitutional: Negative for fever and chills.  HENT: Negative.   Eyes: Negative for blurred vision.  Respiratory: as per HPI  Cardiovascular: Negative for chest pain and palpitations.  Gastrointestinal: Negative for vomiting, diarrhea, blood per rectum. Genitourinary: Negative for dysuria, urgency, frequency and hematuria.  Musculoskeletal: Negative for myalgias, back pain and joint pain.  Skin: Negative for itching and rash.  Neurological: Negative for dizziness, tremors, focal weakness, seizures and loss of consciousness.  Endo/Heme/Allergies: Negative for environmental allergies.  Psychiatric/Behavioral: Negative for depression, suicidal ideas and hallucinations.  All other systems reviewed and are negative.  Physical Exam: Blood pressure 136/80, pulse (!) 111, temperature 98.1 F (36.7 C), temperature source Oral, height 5\' 2"  (1.575 m), weight 222 lb 12.8 oz (101.1 kg), SpO2 95 %. Gen:      No acute distress HEENT:  EOMI, sclera anicteric Neck:     No masses; no thyromegaly Lungs:    Clear to auscultation bilaterally; normal respiratory effort CV:         Regular rate and rhythm; no murmurs Abd:      + bowel sounds; soft, non-tender; no palpable masses, no distension Ext:    No edema; adequate peripheral perfusion Skin:       Warm and dry; no rash Neuro: alert and oriented x 3 Psych: normal mood and affect  Data Reviewed: Imaging: Chest x-ray 06/09/2018- calcified granuloma.  Lungs are clear.  I have reviewed the images personally  Labs: CBC 01/23/2016-WBC 6.4, eos 3%, absolute eosinophil count 192  Assessment:  Consult for asthma Appears to have moderate persistent asthma with significant allergic rhinosinusitis. She has not tolerated beta agonist due to palpitation.  Also start her on inhaled steroids with Qvar Continue Flonase.  Start Singulair  Check baseline CBC with differential, IgE levels and PFTs  Hemoptysis. Resolved As  she has a smoking history we will get a CT scan to make sure there is no worrisome lung findings.  Plan/Recommendations: - Stop Symbicort, start Qvar - Continue Flonase.  Start Singulair - CBC, IgE, PFTs - CT chest  Marshell Garfinkel MD [facility] 10/03/2018, 3:28 PM  CC: Terald Sleeper, PA-C

## 2018-10-03 NOTE — Addendum Note (Signed)
Addended by: Suzzanne Cloud E on: 10/03/2018 03:58 PM   Modules accepted: Orders

## 2018-10-06 LAB — CBC WITH DIFFERENTIAL/PLATELET
Absolute Monocytes: 381 cells/uL (ref 200–950)
Basophils Absolute: 25 cells/uL (ref 0–200)
Basophils Relative: 0.2 %
Eosinophils Absolute: 12 cells/uL — ABNORMAL LOW (ref 15–500)
Eosinophils Relative: 0.1 %
HCT: 38.7 % (ref 35.0–45.0)
Hemoglobin: 12.7 g/dL (ref 11.7–15.5)
Lymphs Abs: 2263 cells/uL (ref 850–3900)
MCH: 26.6 pg — ABNORMAL LOW (ref 27.0–33.0)
MCHC: 32.8 g/dL (ref 32.0–36.0)
MCV: 81 fL (ref 80.0–100.0)
MPV: 10.9 fL (ref 7.5–12.5)
Monocytes Relative: 3.1 %
Neutro Abs: 9619 cells/uL — ABNORMAL HIGH (ref 1500–7800)
Neutrophils Relative %: 78.2 %
Platelets: 464 10*3/uL — ABNORMAL HIGH (ref 140–400)
RBC: 4.78 10*6/uL (ref 3.80–5.10)
RDW: 13.9 % (ref 11.0–15.0)
Total Lymphocyte: 18.4 %
WBC: 12.3 10*3/uL — ABNORMAL HIGH (ref 3.8–10.8)

## 2018-10-06 LAB — IGE: IgE (Immunoglobulin E), Serum: 33 kU/L (ref <=114)

## 2018-10-06 LAB — D-DIMER, QUANTITATIVE: D-Dimer, Quant: 0.38 mcg/mL FEU (ref <0.50)

## 2018-10-07 ENCOUNTER — Telehealth: Payer: Self-pay | Admitting: Pulmonary Disease

## 2018-10-07 NOTE — Telephone Encounter (Signed)
Returned call to patient.  Patient states she is trying to remember what Dr.Mannam told her on her visit.  Reported to her this note in the chart assessment from visit 10/03/18 with Dr. Vaughan Browner:  'Appears to have moderate persistent asthma with significant allergic rhinosinusitis'  Patient is on FMLA for sinus problems and has to meet with her work HR and wanted to know updates to her lung condition from the recent visit here.  Will contact us if she needs anything further.

## 2018-10-08 ENCOUNTER — Institutional Professional Consult (permissible substitution): Payer: PRIVATE HEALTH INSURANCE | Admitting: Pulmonary Disease

## 2018-10-08 DIAGNOSIS — Z029 Encounter for administrative examinations, unspecified: Secondary | ICD-10-CM

## 2018-11-06 ENCOUNTER — Inpatient Hospital Stay: Admission: RE | Admit: 2018-11-06 | Payer: PRIVATE HEALTH INSURANCE | Source: Ambulatory Visit

## 2018-11-28 ENCOUNTER — Other Ambulatory Visit: Payer: BC Managed Care – PPO

## 2018-12-05 ENCOUNTER — Inpatient Hospital Stay: Admission: RE | Admit: 2018-12-05 | Payer: BC Managed Care – PPO | Source: Ambulatory Visit

## 2018-12-26 ENCOUNTER — Inpatient Hospital Stay: Admission: RE | Admit: 2018-12-26 | Payer: BC Managed Care – PPO | Source: Ambulatory Visit

## 2019-02-24 ENCOUNTER — Other Ambulatory Visit: Payer: Self-pay | Admitting: Physician Assistant

## 2019-02-24 DIAGNOSIS — I10 Essential (primary) hypertension: Secondary | ICD-10-CM

## 2019-03-25 ENCOUNTER — Other Ambulatory Visit: Payer: Self-pay | Admitting: Physician Assistant

## 2019-03-25 DIAGNOSIS — I10 Essential (primary) hypertension: Secondary | ICD-10-CM

## 2019-04-22 ENCOUNTER — Other Ambulatory Visit: Payer: Self-pay | Admitting: Physician Assistant

## 2019-04-22 DIAGNOSIS — I10 Essential (primary) hypertension: Secondary | ICD-10-CM

## 2019-04-22 NOTE — Telephone Encounter (Signed)
Jones. NTBS 30 days given 03/25/19

## 2019-04-23 ENCOUNTER — Telehealth: Payer: Self-pay | Admitting: Physician Assistant

## 2019-04-23 DIAGNOSIS — I10 Essential (primary) hypertension: Secondary | ICD-10-CM

## 2019-04-23 MED ORDER — AMLODIPINE BESYLATE 10 MG PO TABS: ORAL_TABLET | ORAL | 0 refills | Status: DC

## 2019-04-23 MED ORDER — HYDROCHLOROTHIAZIDE 25 MG PO TABS: ORAL_TABLET | ORAL | 0 refills | Status: DC

## 2019-04-23 NOTE — Telephone Encounter (Signed)
Has apt scheduled

## 2019-04-23 NOTE — Telephone Encounter (Signed)
[facility] in [place]' pt.

## 2019-04-23 NOTE — Telephone Encounter (Signed)
Pt aware refill sent into pharmacy until appt

## 2019-04-23 NOTE — Telephone Encounter (Signed)
What is the name of the medication? hydrochlorothiazide (Cynthia [name]) 25 MG tablet [Pharmacy Med Name: HYDROCHLOROTHIAZIDE 25MG  TABLETS]  Have you contacted your pharmacy to request a refill? Cynthia [name] (NORVASC) 10 MG tablet [Pharmacy Med Name: Cynthia [name] BESYLATE 10MG  TABLETS]  Which pharmacy would you like this sent to? [facility] [address] Pt has ran out of these meds   she has appt with Glenard Haring on 05/14/2018 Patient notified that their request is being sent to the clinical staff for review and that they should receive a call once it is complete. If they do not receive a call within 24 hours they can check with their pharmacy or our office.

## 2019-04-23 NOTE — Telephone Encounter (Signed)
Out of her meds today & she normally get automated refilled. She has an appt in Feb & I told her to keep appt.

## 2019-05-08 ENCOUNTER — Ambulatory Visit: Payer: BC Managed Care – PPO | Attending: Internal Medicine

## 2019-05-08 DIAGNOSIS — Z20822 Contact with and (suspected) exposure to covid-19: Secondary | ICD-10-CM

## 2019-05-09 LAB — NOVEL CORONAVIRUS, NAA: SARS-CoV-2, NAA: NOT DETECTED

## 2019-05-12 ENCOUNTER — Telehealth: Payer: Self-pay | Admitting: *Deleted

## 2019-05-12 NOTE — Telephone Encounter (Signed)
Patient called given negative covid results ,will call back with information for results to et faxed to her employer.

## 2019-05-12 NOTE — Telephone Encounter (Signed)
FAx request sent to office.

## 2019-05-12 NOTE — Telephone Encounter (Signed)
AttnKaren Kays  FaxKX:[phone]

## 2019-05-15 ENCOUNTER — Ambulatory Visit (INDEPENDENT_AMBULATORY_CARE_PROVIDER_SITE_OTHER): Payer: BC Managed Care – PPO | Admitting: Physician Assistant

## 2019-05-15 ENCOUNTER — Other Ambulatory Visit: Payer: Self-pay

## 2019-05-15 ENCOUNTER — Encounter: Payer: Self-pay | Admitting: Physician Assistant

## 2019-05-15 VITALS — BP 133/79 | HR 91 | Temp 97.8°F | Ht 62.0 in | Wt 219.0 lb

## 2019-05-15 DIAGNOSIS — Z1211 Encounter for screening for malignant neoplasm of colon: Secondary | ICD-10-CM | POA: Diagnosis not present

## 2019-05-15 DIAGNOSIS — I10 Essential (primary) hypertension: Secondary | ICD-10-CM | POA: Diagnosis not present

## 2019-05-15 DIAGNOSIS — J4521 Mild intermittent asthma with (acute) exacerbation: Secondary | ICD-10-CM

## 2019-05-15 DIAGNOSIS — R05 Cough: Secondary | ICD-10-CM | POA: Diagnosis not present

## 2019-05-15 DIAGNOSIS — J301 Allergic rhinitis due to pollen: Secondary | ICD-10-CM

## 2019-05-15 DIAGNOSIS — R059 Cough, unspecified: Secondary | ICD-10-CM

## 2019-05-15 MED ORDER — KETOROLAC TROMETHAMINE 0.5 % OP SOLN: 1.0000 [drp] | Freq: Four times a day (QID) | OPHTHALMIC | 5 refills | Status: DC

## 2019-05-15 MED ORDER — HYDROCHLOROTHIAZIDE 25 MG PO TABS: ORAL_TABLET | ORAL | 0 refills | Status: DC

## 2019-05-15 MED ORDER — ALBUTEROL SULFATE HFA 108 (90 BASE) MCG/ACT IN AERS: 2.0000 | INHALATION_SPRAY | Freq: Four times a day (QID) | RESPIRATORY_TRACT | 5 refills | Status: DC | PRN

## 2019-05-15 MED ORDER — CETIRIZINE HCL 10 MG PO TABS: 10.0000 mg | ORAL_TABLET | Freq: Every day | ORAL | 11 refills | Status: DC

## 2019-05-15 MED ORDER — FLUTICASONE PROPIONATE 50 MCG/ACT NA SUSP: 2.0000 | Freq: Every day | NASAL | 6 refills | Status: DC

## 2019-05-15 MED ORDER — BECLOMETHASONE DIPROPIONATE 80 MCG/ACT IN AERS: 1.0000 | INHALATION_SPRAY | Freq: Two times a day (BID) | RESPIRATORY_TRACT | 5 refills | Status: DC

## 2019-05-15 MED ORDER — MONTELUKAST SODIUM 10 MG PO TABS: 10.0000 mg | ORAL_TABLET | Freq: Every day | ORAL | 3 refills | Status: DC

## 2019-05-15 NOTE — Progress Notes (Signed)
Acute Office Visit  Subjective:    Patient ID: Cynthia [name], female    DOB: Jan 02, 1966, 54 y.o.   MRN: [id number]  Chief Complaint  Patient presents with  . Medical Management of Chronic Issues  . Hypertension    Hypertension This is a chronic problem. The problem is controlled. Pertinent negatives include no chest pain or shortness of breath. Past treatments include calcium channel blockers. The current treatment provides significant improvement.  Asthma She complains of cough, frequent throat clearing, hoarse voice, sputum production and wheezing. There is no chest tightness, hemoptysis or shortness of breath. This is a chronic problem. The current episode started more than 1 year ago. The problem occurs daily. The problem has been waxing and waning. The cough is nocturnal and productive of sputum. Associated symptoms include ear congestion, nasal congestion, postnasal drip, rhinorrhea and sneezing. Pertinent negatives include no chest pain or fever. Her symptoms are alleviated by leukotriene antagonist, beta-agonist and steroid inhaler. She reports moderate improvement on treatment. Her past medical history is significant for asthma.  Sinus Problem This is a chronic problem. The current episode started more than 1 year ago. The problem has been waxing and waning since onset. Associated symptoms include coughing, a hoarse voice and sneezing. Pertinent negatives include no shortness of breath.     Past Medical History:  Diagnosis Date  . High cholesterol   . Hypertension   . Migraine   . Vertigo     Past Surgical History:  Procedure Laterality Date  . ABDOMINAL HYSTERECTOMY    . BREAST EXCISIONAL BIOPSY    . BREAST SURGERY Left    lump removed   . CYST REMOVAL LEG    . FRACTURE SURGERY Right    Arm  . TONSILLECTOMY      Family History  Problem Relation Age of Onset  . Heart failure Mother   . Hypertension Father   . Kidney disease Father   . Cancer Maternal  Grandmother   . Cancer Maternal Aunt     Social History   Socioeconomic History  . Marital status: Married    Spouse name: Not on file  . Number of children: Not on file  . Years of education: Not on file  . Highest education level: Not on file  Occupational History  . Not on file  Tobacco Use  . Smoking status: Former Smoker    Packs/day: 0.50    Years: 10.00    Pack years: 5.00    Types: Cigarettes    Quit date: 06/04/2016    Years since quitting: 2.9  . Smokeless tobacco: Never Used  Substance and Sexual Activity  . Alcohol use: Yes    Comment: occ  . Drug use: No  . Sexual activity: Not on file    Comment: Married  Other Topics Concern  . Not on file  Social History Narrative   Lives at home w/ her husband   Left-handed   Caffeine: 2-3 sodas per day   Social Determinants of Health   Financial Resource Strain:   . Difficulty of Paying Living Expenses: Not on file  Food Insecurity:   . Worried About Charity fundraiser in the Last Year: Not on file  . Ran Out of Food in the Last Year: Not on file  Transportation Needs:   . Lack of Transportation (Medical): Not on file  . Lack of Transportation (Non-Medical): Not on file  Physical Activity:   . Days of Exercise per Week: Not  on file  . Minutes of Exercise per Session: Not on file  Stress:   . Feeling of Stress : Not on file  Social Connections:   . Frequency of Communication with Friends and Family: Not on file  . Frequency of Social Gatherings with Friends and Family: Not on file  . Attends Religious Services: Not on file  . Active Member of Clubs or Organizations: Not on file  . Attends Archivist Meetings: Not on file  . Marital Status: Not on file  Intimate Partner Violence:   . Fear of Current or Ex-Partner: Not on file  . Emotionally Abused: Not on file  . Physically Abused: Not on file  . Sexually Abused: Not on file    Outpatient Medications Prior to Visit  Medication Sig Dispense  Refill  . amLODipine (NORVASC) 10 MG tablet TAKE 1 TABLET(10 MG) BY MOUTH DAILY 30 tablet 0  . albuterol (PROVENTIL HFA;VENTOLIN HFA) 108 (90 Base) MCG/ACT inhaler Inhale 2 puffs into the lungs every 6 (six) hours as needed for wheezing or shortness of breath. 1 Inhaler 11  . beclomethasone (QVAR) 80 MCG/ACT inhaler Inhale 1 puff into the lungs 2 (two) times daily. 1 Inhaler 0  . beclomethasone (QVAR) 80 MCG/ACT inhaler Inhale 1 puff into the lungs 2 (two) times daily. 1 Inhaler 5  . budesonide-formoterol (SYMBICORT) 160-4.5 MCG/ACT inhaler Inhale 2 puffs into the lungs 2 (two) times daily. RINSE MOUTH 1 Inhaler 11  . hydrochlorothiazide (HYDRODIURIL) 25 MG tablet TAKE 1 TABLET BY MOUTH DAILY 30 tablet 0  . loratadine (CLARITIN) 10 MG tablet Take 1 tablet (10 mg total) by mouth daily. 90 tablet 3  . meclizine (ANTIVERT) 25 MG tablet TK 1 T PO TID PRN  0  . montelukast (SINGULAIR) 10 MG tablet Take 1 tablet (10 mg total) by mouth at bedtime. 90 tablet 3  . montelukast (SINGULAIR) 10 MG tablet Take 1 tablet (10 mg total) by mouth at bedtime. 30 tablet 11  . ondansetron (ZOFRAN ODT) 4 MG disintegrating tablet Take 1 tablet (4 mg total) by mouth every 8 (eight) hours as needed for nausea or vomiting. 20 tablet 0  . predniSONE (DELTASONE) 20 MG tablet 2 po at sametime daily for 5 days 10 tablet 0   No facility-administered medications prior to visit.    No Known Allergies  Review of Systems  Constitutional: Negative.  Negative for activity change, fatigue and fever.  HENT: Positive for hoarse voice, postnasal drip, rhinorrhea and sneezing.   Eyes: Negative.   Respiratory: Positive for cough, sputum production and wheezing. Negative for hemoptysis and shortness of breath.   Cardiovascular: Negative.  Negative for chest pain.  Gastrointestinal: Negative.  Negative for abdominal pain.  Endocrine: Negative.   Genitourinary: Negative.  Negative for dysuria.  Musculoskeletal: Negative.   Skin:  Negative.   Neurological: Negative.        Objective:    Physical Exam Constitutional:      Appearance: She is well-developed.  HENT:     Head: Normocephalic and atraumatic.     Right Ear: Tympanic membrane, ear canal and external ear normal.     Left Ear: Tympanic membrane, ear canal and external ear normal.     Nose: Nose normal. No rhinorrhea.     Mouth/Throat:     Pharynx: No oropharyngeal exudate or posterior oropharyngeal erythema.  Eyes:     Conjunctiva/sclera: Conjunctivae normal.     Pupils: Pupils are equal, round, and reactive to light.  Cardiovascular:  Rate and Rhythm: Normal rate and regular rhythm.     Heart sounds: Normal heart sounds.  Pulmonary:     Effort: Pulmonary effort is normal.     Breath sounds: Normal breath sounds.  Abdominal:     General: Bowel sounds are normal.     Palpations: Abdomen is soft.  Musculoskeletal:     Cervical back: Normal range of motion and neck supple.  Skin:    General: Skin is warm and dry.     Findings: No rash.  Neurological:     Mental Status: She is alert and oriented to person, place, and time.     Deep Tendon Reflexes: Reflexes are normal and symmetric.  Psychiatric:        Behavior: Behavior normal.        Thought Content: Thought content normal.        Judgment: Judgment normal.     BP 133/79   Pulse 91   Temp 97.8 F (36.6 C)   Ht 5\' 2"  (1.575 m)   Wt 219 lb (99.3 kg)   SpO2 97%   BMI 40.06 kg/m  Wt Readings from Last 3 Encounters:  05/15/19 219 lb (99.3 kg)  10/03/18 222 lb 12.8 oz (101.1 kg)  06/09/18 221 lb 3.2 oz (100.3 kg)    Health Maintenance Due  Topic Date Due  . Hepatitis C Screening  29-Dec-1965  . HIV Screening  10/06/1978  . TETANUS/TDAP  10/06/1982  . COLONOSCOPY  10/05/2013  . MAMMOGRAM  08/18/2018  . INFLUENZA VACCINE  11/08/2018  . PAP SMEAR-Modifier  01/23/2019    There are no preventive care reminders to display for this patient.   No results found for: TSH Lab  Results  Component Value Date   WBC 12.3 (H) 10/03/2018   HGB 12.7 10/03/2018   HCT 38.7 10/03/2018   MCV 81.0 10/03/2018   PLT 464 (H) 10/03/2018   Lab Results  Component Value Date   NA 141 01/23/2016   K 4.0 01/23/2016   CO2 25 01/23/2016   GLUCOSE 99 01/23/2016   BUN 9 01/23/2016   CREATININE 0.69 01/23/2016   BILITOT 0.3 01/23/2016   ALKPHOS 104 01/23/2016   AST 17 01/23/2016   ALT 18 01/23/2016   PROT 7.2 01/23/2016   ALBUMIN 4.1 01/23/2016   CALCIUM 9.5 01/23/2016   Lab Results  Component Value Date   CHOL 228 (H) 01/23/2016   Lab Results  Component Value Date   HDL 39 (L) 01/23/2016   Lab Results  Component Value Date   LDLCALC 153 (H) 01/23/2016   Lab Results  Component Value Date   TRIG 181 (H) 01/23/2016   Lab Results  Component Value Date   CHOLHDL 5.8 (H) 01/23/2016   No results found for: HGBA1C     Assessment & Plan:   Problem List Items Addressed This Visit      Cardiovascular and Mediastinum   Essential hypertension   Relevant Medications   hydrochlorothiazide (HYDRODIURIL) 25 MG tablet     Respiratory   Allergic rhinitis due to pollen   Relevant Medications   montelukast (SINGULAIR) 10 MG tablet   Reactive airway disease   Relevant Medications   albuterol (VENTOLIN HFA) 108 (90 Base) MCG/ACT inhaler    Other Visit Diagnoses    Colon cancer screening    -  Primary   Relevant Orders   Ambulatory referral to Gastroenterology   Cough       Relevant Medications   albuterol (VENTOLIN  HFA) 108 (90 Base) MCG/ACT inhaler       Meds ordered this encounter  Medications  . beclomethasone (QVAR) 80 MCG/ACT inhaler    Sig: Inhale 1 puff into the lungs 2 (two) times daily. Rinse mouth, controller med    Dispense:  2 Inhaler    Refill:  5    Order Specific Question:   Supervising Provider    Answer:   [name] [id number]    Order Specific Question:   Lot Number?    Answer:   [id number]    Order Specific Question:    Expiration Date?    Answer:   03/10/2019    Order Specific Question:   Quantity    Answer:   1  . hydrochlorothiazide (HYDRODIURIL) 25 MG tablet    Sig: TAKE 1 TABLET BY MOUTH DAILY    Dispense:  30 tablet    Refill:  0    Order Specific Question:   Supervising Provider    Answer:   [name] [id number]  . montelukast (SINGULAIR) 10 MG tablet    Sig: Take 1 tablet (10 mg total) by mouth at bedtime.    Dispense:  90 tablet    Refill:  3    Order Specific Question:   Supervising Provider    Answer:   [name] [id number]  . albuterol (VENTOLIN HFA) 108 (90 Base) MCG/ACT inhaler    Sig: Inhale 2 puffs into the lungs every 6 (six) hours as needed for wheezing or shortness of breath.    Dispense:  18 g    Refill:  5    Order Specific Question:   Supervising Provider    Answer:   [name] [id number]  . ketorolac (ACULAR) 0.5 % ophthalmic solution    Sig: Place 1 drop into both eyes 4 (four) times daily.    Dispense:  10 mL    Refill:  5    Order Specific Question:   Supervising Provider    Answer:   [name] [id number]  . cetirizine (ZYRTEC) 10 MG tablet    Sig: Take 1 tablet (10 mg total) by mouth daily.    Dispense:  30 tablet    Refill:  11    Order Specific Question:   Supervising Provider    Answer:   [name] [id number]  . fluticasone (FLONASE) 50 MCG/ACT nasal spray    Sig: Place 2 sprays into both nostrils daily.    Dispense:  16 g    Refill:  6    Order Specific Question:   Supervising Provider    Answer:   [name] [id number]     Terald Sleeper, PA-C   Terald Sleeper PA-C [facility] [address]  [place], [place] [address] [phone]

## 2019-05-21 ENCOUNTER — Other Ambulatory Visit: Payer: Self-pay | Admitting: Physician Assistant

## 2019-05-21 DIAGNOSIS — I10 Essential (primary) hypertension: Secondary | ICD-10-CM

## 2019-06-12 ENCOUNTER — Encounter: Payer: Self-pay | Admitting: Gastroenterology

## 2019-06-21 ENCOUNTER — Other Ambulatory Visit: Payer: Self-pay | Admitting: Physician Assistant

## 2019-06-21 DIAGNOSIS — I10 Essential (primary) hypertension: Secondary | ICD-10-CM

## 2019-07-14 ENCOUNTER — Ambulatory Visit (INDEPENDENT_AMBULATORY_CARE_PROVIDER_SITE_OTHER): Payer: BC Managed Care – PPO | Admitting: Family Medicine

## 2019-07-14 ENCOUNTER — Encounter: Payer: BC Managed Care – PPO | Admitting: Physician Assistant

## 2019-07-14 ENCOUNTER — Encounter: Payer: Self-pay | Admitting: Family Medicine

## 2019-07-14 ENCOUNTER — Other Ambulatory Visit: Payer: Self-pay

## 2019-07-14 VITALS — BP 126/85 | HR 92 | Temp 97.4°F | Ht 62.0 in | Wt 217.0 lb

## 2019-07-14 DIAGNOSIS — Z01411 Encounter for gynecological examination (general) (routine) with abnormal findings: Secondary | ICD-10-CM | POA: Diagnosis not present

## 2019-07-14 DIAGNOSIS — Z124 Encounter for screening for malignant neoplasm of cervix: Secondary | ICD-10-CM | POA: Diagnosis not present

## 2019-07-14 DIAGNOSIS — Z90710 Acquired absence of both cervix and uterus: Secondary | ICD-10-CM

## 2019-07-14 DIAGNOSIS — Z130 Encounter for screening for diseases of the blood and blood-forming organs and certain disorders involving the immune mechanism: Secondary | ICD-10-CM

## 2019-07-14 DIAGNOSIS — Z01419 Encounter for gynecological examination (general) (routine) without abnormal findings: Secondary | ICD-10-CM

## 2019-07-14 DIAGNOSIS — J310 Chronic rhinitis: Secondary | ICD-10-CM

## 2019-07-14 DIAGNOSIS — J329 Chronic sinusitis, unspecified: Secondary | ICD-10-CM

## 2019-07-14 DIAGNOSIS — I10 Essential (primary) hypertension: Secondary | ICD-10-CM | POA: Diagnosis not present

## 2019-07-14 DIAGNOSIS — Z029 Encounter for administrative examinations, unspecified: Secondary | ICD-10-CM

## 2019-07-14 LAB — BAYER DCA HB A1C WAIVED: HB A1C (BAYER DCA - WAIVED): 5.6 % (ref <7.0)

## 2019-07-14 NOTE — Progress Notes (Signed)
Cynthia [name] is a 54 y.o. female presents to office today for annual physical exam examination.    Concerns today include: 1. Sinusitis Patient reports a right sided maxillary facial pain.  She notes this is typical of her sinusitis.  She struggles a lot with allergies.  She is under the care of Dr. Constance Holster in [place].  She uses her Zyrtec, Singulair and Flonase.  She intermittently uses her Qvar.  She has allergy induced asthma.  No shortness of breath, coughing or wheezing.  Non-smoker.  Aggie Moats did not work for her.  No purulence from nares.  Occupation: Arboriculturist, Programmer, applications, cosmetology, Marital status: married, Substance use: none Diet: poor, Exercise: no structured Last eye exam: scheduled (some blurred vision lately) Last colonoscopy: scheduled (scant bleeds with hemorrhoid) Last mammogram: scheduled (just had covid shot) Last pap smear: needs; history of partial hysterectomy.  Spared one ovary on the right. Refills needed today: none  Past Medical History:  Diagnosis Date  . High cholesterol   . Hypertension   . Migraine   . Vertigo    Social History   Socioeconomic History  . Marital status: Married    Spouse name: Not on file  . Number of children: Not on file  . Years of education: Not on file  . Highest education level: Not on file  Occupational History  . Not on file  Tobacco Use  . Smoking status: Former Smoker    Packs/day: 0.50    Years: 10.00    Pack years: 5.00    Types: Cigarettes    Quit date: 06/04/2016    Years since quitting: 3.1  . Smokeless tobacco: Never Used  Substance and Sexual Activity  . Alcohol use: Yes    Comment: occ  . Drug use: No  . Sexual activity: Not on file    Comment: Married  Other Topics Concern  . Not on file  Social History Narrative   Lives at home w/ her husband   Left-handed   Caffeine: 2-3 sodas per day   Social Determinants of Health   Financial Resource Strain:   . Difficulty of Paying Living  Expenses:   Food Insecurity:   . Worried About Charity fundraiser in the Last Year:   . Arboriculturist in the Last Year:   Transportation Needs:   . Film/video editor (Medical):   Marland Kitchen Lack of Transportation (Non-Medical):   Physical Activity:   . Days of Exercise per Week:   . Minutes of Exercise per Session:   Stress:   . Feeling of Stress :   Social Connections:   . Frequency of Communication with Friends and Family:   . Frequency of Social Gatherings with Friends and Family:   . Attends Religious Services:   . Active Member of Clubs or Organizations:   . Attends Archivist Meetings:   Marland Kitchen Marital Status:   Intimate Partner Violence:   . Fear of Current or Ex-Partner:   . Emotionally Abused:   Marland Kitchen Physically Abused:   . Sexually Abused:    Past Surgical History:  Procedure Laterality Date  . ABDOMINAL HYSTERECTOMY    . BREAST EXCISIONAL BIOPSY    . BREAST SURGERY Left    lump removed   . CYST REMOVAL LEG    . FRACTURE SURGERY Right    Arm  . TONSILLECTOMY     Family History  Problem Relation Age of Onset  . Heart failure Mother   .  Hypertension Father   . Kidney disease Father   . Cancer Maternal Grandmother   . Cancer Maternal Aunt     Current Outpatient Medications:  .  albuterol (VENTOLIN HFA) 108 (90 Base) MCG/ACT inhaler, Inhale 2 puffs into the lungs every 6 (six) hours as needed for wheezing or shortness of breath., Disp: 18 g, Rfl: 5 .  amLODipine (NORVASC) 10 MG tablet, TAKE 1 TABLET(10 MG) BY MOUTH DAILY, Disp: 30 tablet, Rfl: 5 .  beclomethasone (QVAR) 80 MCG/ACT inhaler, Inhale 1 puff into the lungs 2 (two) times daily. Rinse mouth, controller med, Disp: 2 Inhaler, Rfl: 5 .  cetirizine (ZYRTEC) 10 MG tablet, Take 1 tablet (10 mg total) by mouth daily., Disp: 30 tablet, Rfl: 11 .  fluticasone (FLONASE) 50 MCG/ACT nasal spray, Place 2 sprays into both nostrils daily., Disp: 16 g, Rfl: 6 .  hydrochlorothiazide (HYDRODIURIL) 25 MG tablet, TAKE 1  TABLET BY MOUTH DAILY, Disp: 30 tablet, Rfl: 5 .  ketorolac (ACULAR) 0.5 % ophthalmic solution, Place 1 drop into both eyes 4 (four) times daily., Disp: 10 mL, Rfl: 5 .  montelukast (SINGULAIR) 10 MG tablet, Take 1 tablet (10 mg total) by mouth at bedtime., Disp: 90 tablet, Rfl: 3  No Known Allergies   ROS: Review of Systems A comprehensive review of systems was negative except for: Gastrointestinal: positive for hemorrhoids    Physical exam BP 126/85   Pulse 92   Temp (!) 97.4 F (36.3 C) (Temporal)   Ht '5\' 2"'  (1.575 m)   Wt 217 lb (98.4 kg)   SpO2 97%   BMI 39.69 kg/m  General appearance: alert, cooperative, appears stated age and no distress Head: Normocephalic, without obvious abnormality, atraumatic Eyes: negative findings: lids and lashes normal, conjunctivae and sclerae normal, corneas clear and pupils equal, round, reactive to light and accomodation Ears: normal TM's and external ear canals both ears Nose: Hemostatic bleed noted on the right.  She has mild edema of the nasal turbinates bilaterally Throat: lips, mucosa, and tongue normal; teeth and gums normal Neck: no adenopathy, supple, symmetrical, trachea midline and thyroid not enlarged, symmetric, no tenderness/mass/nodules Back: symmetric, no curvature. ROM normal. No CVA tenderness. Lungs: Mild expiratory wheezes noted in the upper lungs.  Good air movement.  Normal work of breathing on room air. Heart: regular rate and rhythm, S1, S2 normal, no murmur, click, rub or gallop Abdomen: soft, non-tender; bowel sounds normal; no masses,  no organomegaly Pelvic: cervix normal in appearance, external genitalia normal, no adnexal masses or tenderness, no cervical motion tenderness, rectovaginal septum normal, vagina normal without discharge and Cervix is to the patient's anatomic left.  Her uterus is surgically absent.  Nonthrombosed hemorrhoid noted. Extremities: extremities normal, atraumatic, no cyanosis or edema Pulses: 2+  and symmetric Skin: Skin color, texture, turgor normal. No rashes or lesions Lymph nodes: Cervical, supraclavicular, and axillary nodes normal. Neurologic: Grossly normal Psych: Mood stable, speech normal, affect appropriate.  Patient is pleasant and jovial. Depression screen [facility] 2/9 07/14/2019 05/15/2019 06/09/2018  Decreased Interest 0 0 0  Down, Depressed, Hopeless 0 0 0  PHQ - 2 Score 0 0 0  Altered sleeping 0 - -  Tired, decreased energy 0 - -  Change in appetite 0 - -  Feeling bad or failure about yourself  0 - -  Trouble concentrating 0 - -  Moving slowly or fidgety/restless 0 - -  Suicidal thoughts 0 - -  PHQ-9 Score 0 - -   Assessment/ Plan: Maya  Bebe Liter here for annual physical exam.   1. Well woman exam with routine gynecological exam  2. Screening for malignant neoplasm of cervix Still has cervix - IGP, Aptima HPV  3. S/P hysterectomy Partial hysterectomy  4. Essential hypertension Controlled - CMP14+EGFR; Future - Lipid panel; Future - Lipid panel - CMP14+EGFR  5. Morbid obesity ([place]) Check sugar - Lipid panel; Future - TSH; Future - Bayer DCA Hb A1c Waived; Future - Bayer DCA Hb A1c Waived - TSH - Lipid panel  6. Screening, anemia, deficiency, iron - CBC with Differential; Future - CBC with Differential  7. Rhinosinusitis Add cromolyn. Resume qvar   Counseled on healthy lifestyle choices, including diet (rich in fruits, vegetables and lean meats and low in salt and simple carbohydrates) and exercise (at least 30 minutes of moderate physical activity daily).  Patient to follow up in 1 year for annual exam or sooner if needed.  Ashly M. Lajuana Ripple, DO

## 2019-07-14 NOTE — Patient Instructions (Signed)
Cromolyn for allergies.  Can pick up at [facility] had labs performed today.  You will be contacted with the results of the labs once they are available, usually in the next 3 business days for routine lab work.  If you have an active my chart account, they will be released to your MyChart.  If you prefer to have these labs released to you via telephone, please let us know.  If you had a pap smear or biopsy performed, expect to be contacted in about 7-10 days.  Preventive Care 65-32 Years Old, Female Preventive care refers to visits with your health care provider and lifestyle choices that can promote health and wellness. This includes:  A yearly physical exam. This may also be called an annual well check.  Regular dental visits and eye exams.  Immunizations.  Screening for certain conditions.  Healthy lifestyle choices, such as eating a healthy diet, getting regular exercise, not using drugs or products that contain nicotine and tobacco, and limiting alcohol use. What can I expect for my preventive care visit? Physical exam Your health care provider will check your:  Height and weight. This may be used to calculate body mass index (BMI), which tells if you are at a healthy weight.  Heart rate and blood pressure.  Skin for abnormal spots. Counseling Your health care provider may ask you questions about your:  Alcohol, tobacco, and drug use.  Emotional well-being.  Home and relationship well-being.  Sexual activity.  Eating habits.  Work and work Statistician.  Method of birth control.  Menstrual cycle.  Pregnancy history. What immunizations do I need?  Influenza (flu) vaccine  This is recommended every year. Tetanus, diphtheria, and pertussis (Tdap) vaccine  You may need a Td booster every 10 years. Varicella (chickenpox) vaccine  You may need this if you have not been vaccinated. Zoster (shingles) vaccine  You may need this after age 61. Measles, mumps,  and rubella (MMR) vaccine  You may need at least one dose of MMR if you were born in 1957 or later. You may also need a second dose. Pneumococcal conjugate (PCV13) vaccine  You may need this if you have certain conditions and were not previously vaccinated. Pneumococcal polysaccharide (PPSV23) vaccine  You may need one or two doses if you smoke cigarettes or if you have certain conditions. Meningococcal conjugate (MenACWY) vaccine  You may need this if you have certain conditions. Hepatitis A vaccine  You may need this if you have certain conditions or if you travel or work in places where you may be exposed to hepatitis A. Hepatitis B vaccine  You may need this if you have certain conditions or if you travel or work in places where you may be exposed to hepatitis B. Haemophilus influenzae type b (Hib) vaccine  You may need this if you have certain conditions. Human papillomavirus (HPV) vaccine  If recommended by your health care provider, you may need three doses over 6 months. You may receive vaccines as individual doses or as more than one vaccine together in one shot (combination vaccines). Talk with your health care provider about the risks and benefits of combination vaccines. What tests do I need? Blood tests  Lipid and cholesterol levels. These may be checked every 5 years, or more frequently if you are over 45 years old.  Hepatitis C test.  Hepatitis B test. Screening  Lung cancer screening. You may have this screening every year starting at age 77 if you have  a 30-pack-year history of smoking and currently smoke or have quit within the past 15 years.  Colorectal cancer screening. All adults should have this screening starting at age 71 and continuing until age 68. Your health care provider may recommend screening at age 64 if you are at increased risk. You will have tests every 1-10 years, depending on your results and the type of screening test.  Diabetes screening.  This is done by checking your blood sugar (glucose) after you have not eaten for a while (fasting). You may have this done every 1-3 years.  Mammogram. This may be done every 1-2 years. Talk with your health care provider about when you should start having regular mammograms. This may depend on whether you have a family history of breast cancer.  BRCA-related cancer screening. This may be done if you have a family history of breast, ovarian, tubal, or peritoneal cancers.  Pelvic exam and Pap test. This may be done every 3 years starting at age 72. Starting at age 37, this may be done every 5 years if you have a Pap test in combination with an HPV test. Other tests  Sexually transmitted disease (STD) testing.  Bone density scan. This is done to screen for osteoporosis. You may have this scan if you are at high risk for osteoporosis. Follow these instructions at home: Eating and drinking  Eat a diet that includes fresh fruits and vegetables, whole grains, lean protein, and low-fat dairy.  Take vitamin and mineral supplements as recommended by your health care provider.  Do not drink alcohol if: ? Your health care provider tells you not to drink. ? You are pregnant, may be pregnant, or are planning to become pregnant.  If you drink alcohol: ? Limit how much you have to 0-1 drink a day. ? Be aware of how much alcohol is in your drink. In the [place] one drink equals one 12 oz bottle of beer (355 mL), one 5 oz glass of wine (148 mL), or one 1 oz glass of hard liquor (44 mL). Lifestyle  Take daily care of your teeth and gums.  Stay active. Exercise for at least 30 minutes on 5 or more days each week.  Do not use any products that contain nicotine or tobacco, such as cigarettes, e-cigarettes, and chewing tobacco. If you need help quitting, ask your health care provider.  If you are sexually active, practice safe sex. Use a condom or other form of birth control (contraception) in order to  prevent pregnancy and STIs (sexually transmitted infections).  If told by your health care provider, take low-dose aspirin daily starting at age 42. What's next?  Visit your health care provider once a year for a well check visit.  Ask your health care provider how often you should have your eyes and teeth checked.  Stay up to date on all vaccines. This information is not intended to replace advice given to you by your health care provider. Make sure you discuss any questions you have with your health care provider. Document Revised: 12/05/2017 Document Reviewed: 12/05/2017 Elsevier Patient Education  2020 [employer].

## 2019-07-15 LAB — CMP14+EGFR
ALT: 16 IU/L (ref 0–32)
AST: 19 IU/L (ref 0–40)
Albumin/Globulin Ratio: 1.6 (ref 1.2–2.2)
Albumin: 4.3 g/dL (ref 3.8–4.9)
Alkaline Phosphatase: 112 IU/L (ref 39–117)
BUN/Creatinine Ratio: 13 (ref 9–23)
BUN: 12 mg/dL (ref 6–24)
Bilirubin Total: 0.3 mg/dL (ref 0.0–1.2)
CO2: 21 mmol/L (ref 20–29)
Calcium: 9.9 mg/dL (ref 8.7–10.2)
Chloride: 107 mmol/L — ABNORMAL HIGH (ref 96–106)
Creatinine, Ser: 0.91 mg/dL (ref 0.57–1.00)
GFR calc Af Amer: 82 mL/min/{1.73_m2} (ref >59)
GFR calc non Af Amer: 71 mL/min/{1.73_m2} (ref >59)
Globulin, Total: 2.7 g/dL (ref 1.5–4.5)
Glucose: 95 mg/dL (ref 65–99)
Potassium: 3.9 mmol/L (ref 3.5–5.2)
Sodium: 144 mmol/L (ref 134–144)
Total Protein: 7 g/dL (ref 6.0–8.5)

## 2019-07-15 LAB — LIPID PANEL
Chol/HDL Ratio: 6.8 ratio — ABNORMAL HIGH (ref 0.0–4.4)
Cholesterol, Total: 226 mg/dL — ABNORMAL HIGH (ref 100–199)
HDL: 33 mg/dL — ABNORMAL LOW (ref >39)
LDL Chol Calc (NIH): 138 mg/dL — ABNORMAL HIGH (ref 0–99)
Triglycerides: 304 mg/dL — ABNORMAL HIGH (ref 0–149)
VLDL Cholesterol Cal: 55 mg/dL — ABNORMAL HIGH (ref 5–40)

## 2019-07-15 LAB — CBC WITH DIFFERENTIAL/PLATELET
Basophils Absolute: 0.1 10*3/uL (ref 0.0–0.2)
Basos: 1 %
EOS (ABSOLUTE): 0.1 10*3/uL (ref 0.0–0.4)
Eos: 2 %
Hematocrit: 40.5 % (ref 34.0–46.6)
Hemoglobin: 13.1 g/dL (ref 11.1–15.9)
Immature Grans (Abs): 0 10*3/uL (ref 0.0–0.1)
Immature Granulocytes: 1 %
Lymphocytes Absolute: 2.8 10*3/uL (ref 0.7–3.1)
Lymphs: 36 %
MCH: 26.5 pg — ABNORMAL LOW (ref 26.6–33.0)
MCHC: 32.3 g/dL (ref 31.5–35.7)
MCV: 82 fL (ref 79–97)
Monocytes Absolute: 0.4 10*3/uL (ref 0.1–0.9)
Monocytes: 6 %
Neutrophils Absolute: 4.2 10*3/uL (ref 1.4–7.0)
Neutrophils: 54 %
Platelets: 424 10*3/uL (ref 150–450)
RBC: 4.94 x10E6/uL (ref 3.77–5.28)
RDW: 13.7 % (ref 11.7–15.4)
WBC: 7.6 10*3/uL (ref 3.4–10.8)

## 2019-07-15 LAB — TSH: TSH: 0.887 u[IU]/mL (ref 0.450–4.500)

## 2019-07-16 ENCOUNTER — Other Ambulatory Visit: Payer: Self-pay

## 2019-07-16 LAB — IGP, APTIMA HPV: HPV Aptima: NEGATIVE

## 2019-07-16 MED ORDER — ATORVASTATIN CALCIUM 20 MG PO TABS: 20.0000 mg | ORAL_TABLET | Freq: Every day | ORAL | 1 refills | Status: DC

## 2019-07-27 ENCOUNTER — Encounter: Payer: BC Managed Care – PPO | Admitting: Gastroenterology

## 2019-07-28 ENCOUNTER — Telehealth: Payer: Self-pay | Admitting: Family Medicine

## 2019-08-24 ENCOUNTER — Encounter: Payer: BC Managed Care – PPO | Admitting: Gastroenterology

## 2019-11-19 ENCOUNTER — Other Ambulatory Visit: Payer: Self-pay | Admitting: *Deleted

## 2019-11-19 DIAGNOSIS — I10 Essential (primary) hypertension: Secondary | ICD-10-CM

## 2019-11-19 MED ORDER — AMLODIPINE BESYLATE 10 MG PO TABS: ORAL_TABLET | ORAL | 5 refills | Status: DC

## 2019-11-25 ENCOUNTER — Other Ambulatory Visit: Payer: Self-pay

## 2019-11-25 ENCOUNTER — Encounter: Payer: Self-pay | Admitting: Family Medicine

## 2019-11-25 ENCOUNTER — Ambulatory Visit (INDEPENDENT_AMBULATORY_CARE_PROVIDER_SITE_OTHER): Payer: BC Managed Care – PPO | Admitting: Family Medicine

## 2019-11-25 VITALS — BP 123/81 | HR 89 | Temp 97.9°F | Ht 62.0 in | Wt 217.0 lb

## 2019-11-25 DIAGNOSIS — M1711 Unilateral primary osteoarthritis, right knee: Secondary | ICD-10-CM | POA: Diagnosis not present

## 2019-11-25 MED ORDER — METHYLPREDNISOLONE ACETATE 80 MG/ML IJ SUSP: 80.0000 mg | Freq: Once | INTRAMUSCULAR | Status: DC

## 2019-11-25 NOTE — Progress Notes (Signed)
BP 123/81   Pulse 89   Temp 97.9 F (36.6 C)   Ht 5\' 2"  (1.575 m)   Wt 217 lb (98.4 kg)   SpO2 90%   BMI 39.69 kg/m    Subjective:   Patient ID: [name], female    DOB: 1966-03-24, 54 y.o.   MRN: [id number]  HPI: Cynthia [name] is a 54 y.o. female presenting on 11/25/2019 for Knee Pain (right. cracking, popping. Last inj about one month ago. That inj only helped for a few days)   HPI Patient is coming in complaint of right knee pain that is been going on for more than a couple years but has recently worsened over the past couple weeks.  She feels like she gets a popping and grinding swelling in the knee, is not severe pain but is annoying.  She is on her feet all the time and has been a lot more bothersome for her.  Patient denies any fevers or chills or giving way but she does get the popping and grinding.  Relevant past medical, surgical, family and social history reviewed and updated as indicated. Interim medical history since our last visit reviewed. Allergies and medications reviewed and updated.  Review of Systems  Constitutional: Negative for chills and fever.  Eyes: Negative for visual disturbance.  Respiratory: Negative for chest tightness and shortness of breath.   Cardiovascular: Negative for chest pain and leg swelling.  Musculoskeletal: Positive for arthralgias.  Skin: Negative for color change and rash.  Neurological: Negative for light-headedness and headaches.  Psychiatric/Behavioral: Negative for agitation and behavioral problems.  All other systems reviewed and are negative.   Per HPI unless specifically indicated above   Allergies as of 11/25/2019   No Known Allergies     Medication List       Accurate as of November 25, 2019 11:18 AM. If you have any questions, ask your nurse or doctor.        albuterol 108 (90 Base) MCG/ACT inhaler Commonly known as: VENTOLIN HFA Inhale 2 puffs into the lungs every 6 (six) hours as needed for  wheezing or shortness of breath.   amLODipine 10 MG tablet Commonly known as: NORVASC Take one tab po daily   atorvastatin 20 MG tablet Commonly known as: LIPITOR Take 1 tablet (20 mg total) by mouth daily.   beclomethasone 80 MCG/ACT inhaler Commonly known as: QVAR Inhale 1 puff into the lungs 2 (two) times daily. Rinse mouth, controller med   cetirizine 10 MG tablet Commonly known as: ZYRTEC Take 1 tablet (10 mg total) by mouth daily.   fluticasone 50 MCG/ACT nasal spray Commonly known as: FLONASE Place 2 sprays into both nostrils daily.   hydrochlorothiazide 25 MG tablet Commonly known as: HYDRODIURIL TAKE 1 TABLET BY MOUTH DAILY   ketorolac 0.5 % ophthalmic solution Commonly known as: ACULAR Place 1 drop into both eyes 4 (four) times daily.   montelukast 10 MG tablet Commonly known as: SINGULAIR Take 1 tablet (10 mg total) by mouth at bedtime.   Qvar RediHaler 80 MCG/ACT inhaler Generic drug: beclomethasone        Objective:   BP 123/81   Pulse 89   Temp 97.9 F (36.6 C)   Ht 5\' 2"  (1.575 m)   Wt 217 lb (98.4 kg)   SpO2 90%   BMI 39.69 kg/m   Wt Readings from Last 3 Encounters:  11/25/19 217 lb (98.4 kg)  07/14/19 217 lb (98.4 kg)  05/15/19 219  lb (99.3 kg)    Physical Exam Vitals and nursing note reviewed.  Constitutional:      General: She is not in acute distress.    Appearance: She is well-developed. She is not diaphoretic.  Eyes:     Conjunctiva/sclera: Conjunctivae normal.  Musculoskeletal:        General: No tenderness.     Right knee: Effusion and crepitus present. No bony tenderness. Normal range of motion. No tenderness. No LCL laxity, MCL laxity, ACL laxity or PCL laxity. Normal alignment.  Skin:    General: Skin is warm and dry.     Findings: No rash.  Neurological:     Mental Status: She is alert and oriented to person, place, and time.     Coordination: Coordination normal.  Psychiatric:        Behavior: Behavior normal.        Assessment & Plan:   Problem List Items Addressed This Visit    None    Visit Diagnoses    Primary osteoarthritis of right knee    -  Primary   Relevant Medications   methylPREDNISolone acetate (DEPO-MEDROL) injection 80 mg (Start on 11/25/2019 11:45 AM)      Knee injection: Consent form signed. Risk factors of bleeding and infection discussed with patient and patient is agreeable towards injection. Patient prepped with Betadine. Lateral approach towards injection used. Injected 80 mg of Depo-Medrol and 1 mL of 2% lidocaine. Patient tolerated procedure well and no side effects from noted. Minimal to no bleeding. Simple bandage applied after.  Follow up plan: Return if symptoms worsen or fail to improve.  Counseling provided for all of the vaccine components No orders of the defined types were placed in this encounter.   Caryl Pina, MD [facility] 11/25/2019, 11:18 AM

## 2019-12-21 ENCOUNTER — Other Ambulatory Visit: Payer: Self-pay | Admitting: *Deleted

## 2019-12-21 DIAGNOSIS — I10 Essential (primary) hypertension: Secondary | ICD-10-CM

## 2019-12-21 MED ORDER — HYDROCHLOROTHIAZIDE 25 MG PO TABS: 25.0000 mg | ORAL_TABLET | Freq: Every day | ORAL | 0 refills | Status: DC

## 2020-01-05 ENCOUNTER — Ambulatory Visit: Payer: BC Managed Care – PPO | Admitting: Family Medicine

## 2020-01-07 ENCOUNTER — Encounter: Payer: Self-pay | Admitting: Family Medicine

## 2020-01-07 ENCOUNTER — Ambulatory Visit (INDEPENDENT_AMBULATORY_CARE_PROVIDER_SITE_OTHER): Payer: BC Managed Care – PPO | Admitting: Family Medicine

## 2020-01-07 ENCOUNTER — Other Ambulatory Visit: Payer: Self-pay

## 2020-01-07 VITALS — BP 131/82 | HR 83 | Temp 98.1°F | Ht 62.0 in | Wt 221.2 lb

## 2020-01-07 DIAGNOSIS — K649 Unspecified hemorrhoids: Secondary | ICD-10-CM | POA: Diagnosis not present

## 2020-01-07 DIAGNOSIS — K921 Melena: Secondary | ICD-10-CM

## 2020-01-07 DIAGNOSIS — Z1211 Encounter for screening for malignant neoplasm of colon: Secondary | ICD-10-CM

## 2020-01-07 NOTE — Progress Notes (Signed)
Subjective: CC: blood in stool PCP: [name], DO  [name] is a 54 y.o. female presenting to clinic today for:  1. Blood in stool [name] reports bright red blood with wiping her rectum and sometimes in her stool on and off over the last year. She reports that she currently has a hemorrhoid that has been bothering her. She has tried Vaseline without relief. She has always struggled with constipation and has a BM every 3-4 days that sometimes requires straining. She admits that her diet is not high in fiber and she does not drink a lot of water. She has tried metamucil and miralax in the past but not recently. She has never had a colonoscopy before. She previously had one scheduled but had to cancel the appointment. She denies black tarry stools, dizziness, lightheadedness, weakness, syncope, nausea, vomiting, diarrhea, or abdominal pain.   Relevant past medical, surgical, family, and social history reviewed and updated as indicated.  Allergies and medications reviewed and updated.  No Known Allergies Past Medical History:  Diagnosis Date  . High cholesterol   . Hypertension   . Migraine   . Vertigo     Current Outpatient Medications:  .  albuterol (VENTOLIN HFA) 108 (90 Base) MCG/ACT inhaler, Inhale 2 puffs into the lungs every 6 (six) hours as needed for wheezing or shortness of breath., Disp: 18 g, Rfl: 5 .  amLODipine (NORVASC) 10 MG tablet, Take one tab po daily, Disp: 30 tablet, Rfl: 5 .  atorvastatin (LIPITOR) 20 MG tablet, Take 1 tablet (20 mg total) by mouth daily., Disp: 90 tablet, Rfl: 1 .  beclomethasone (QVAR) 80 MCG/ACT inhaler, Inhale 1 puff into the lungs 2 (two) times daily. Rinse mouth, controller med, Disp: 2 Inhaler, Rfl: 5 .  cetirizine (ZYRTEC) 10 MG tablet, Take 1 tablet (10 mg total) by mouth daily., Disp: 30 tablet, Rfl: 11 .  fluticasone (FLONASE) 50 MCG/ACT nasal spray, Place 2 sprays into both nostrils daily., Disp: 16 g, Rfl: 6 .   hydrochlorothiazide (HYDRODIURIL) 25 MG tablet, Take 1 tablet (25 mg total) by mouth daily., Disp: 30 tablet, Rfl: 0 .  ketorolac (ACULAR) 0.5 % ophthalmic solution, Place 1 drop into both eyes 4 (four) times daily., Disp: 10 mL, Rfl: 5 .  montelukast (SINGULAIR) 10 MG tablet, Take 1 tablet (10 mg total) by mouth at bedtime., Disp: 90 tablet, Rfl: 3  Current Facility-Administered Medications:  .  methylPREDNISolone acetate (DEPO-MEDROL) injection 80 mg, 80 mg, Intramuscular, Once, Dettinger, Fransisca Kaufmann, MD Social History   Socioeconomic History  . Marital status: Married    Spouse name: Not on file  . Number of children: Not on file  . Years of education: Not on file  . Highest education level: Not on file  Occupational History  . Not on file  Tobacco Use  . Smoking status: Former Smoker    Packs/day: 0.50    Years: 10.00    Pack years: 5.00    Types: Cigarettes    Quit date: 06/04/2016    Years since quitting: 3.5  . Smokeless tobacco: Never Used  Vaping Use  . Vaping Use: Never used  Substance and Sexual Activity  . Alcohol use: Yes    Comment: occ  . Drug use: No  . Sexual activity: Not Currently    Partners: Male    Comment: Married  Other Topics Concern  . Not on file  Social History Narrative   Lives at home w/ her husband  Left-handed   Caffeine: 2-3 sodas per day   Social Determinants of Health   Financial Resource Strain:   . Difficulty of Paying Living Expenses: Not on file  Food Insecurity:   . Worried About Charity fundraiser in the Last Year: Not on file  . Ran Out of Food in the Last Year: Not on file  Transportation Needs:   . Lack of Transportation (Medical): Not on file  . Lack of Transportation (Non-Medical): Not on file  Physical Activity:   . Days of Exercise per Week: Not on file  . Minutes of Exercise per Session: Not on file  Stress:   . Feeling of Stress : Not on file  Social Connections:   . Frequency of Communication with Friends and  Family: Not on file  . Frequency of Social Gatherings with Friends and Family: Not on file  . Attends Religious Services: Not on file  . Active Member of Clubs or Organizations: Not on file  . Attends Archivist Meetings: Not on file  . Marital Status: Not on file  Intimate Partner Violence:   . Fear of Current or Ex-Partner: Not on file  . Emotionally Abused: Not on file  . Physically Abused: Not on file  . Sexually Abused: Not on file   Family History  Problem Relation Age of Onset  . Heart failure Mother   . Hypertension Father   . Kidney disease Father   . Cancer Maternal Grandmother   . Cancer Maternal Aunt     Review of Systems  Per HPI.   Objective: Office vital signs reviewed. BP 131/82   Pulse 83   Temp 98.1 F (36.7 C) (Temporal)   Ht 5\' 2"  (1.575 m)   Wt 221 lb 4 oz (100.4 kg)   BMI 40.47 kg/m   Physical Examination:  Physical Exam Vitals and nursing note reviewed.  Constitutional:      General: She is not in acute distress.    Appearance: She is not ill-appearing or diaphoretic.  Abdominal:     General: Bowel sounds are normal. There is no distension.     Palpations: Abdomen is soft. There is no mass.     Tenderness: There is no abdominal tenderness. There is no guarding or rebound.     Hernia: No hernia is present.  Genitourinary:    Rectum: External hemorrhoid present. No anal fissure.  Skin:    General: Skin is warm and dry.  Neurological:     Mental Status: She is alert.  Psychiatric:        Mood and Affect: Mood normal.        Behavior: Behavior normal.      Assessment/ Plan: Orella was seen today for blood in stools.  Diagnoses and all orders for this visit:  Colon cancer screening -     Ambulatory referral to Gastroenterology  Hemorrhoids, unspecified hemorrhoid type Discussed diet, hydration, Preperation H, Tucks pads, miralax, and stool softeners. Follow up if new or worsening symptoms, or if symptoms persist.  -      Ambulatory referral to Gastroenterology  Blood in stool, frank Discussed that blood is likely from hemorrhoids and that she likely has internal hemorrhoids as well. Discussed reason to seek emergency help. Referral to GI as she is also due for a colonoscopy.  -     Ambulatory referral to Gastroenterology  Follow up as needed.   The above assessment and management plan was discussed with the patient. The patient  verbalized understanding of and has agreed to the management plan. Patient is aware to call the clinic if symptoms persist or worsen. Patient is aware when to return to the clinic for a follow-up visit. Patient educated on when it is appropriate to go to the emergency department.   Marjorie Smolder, FNP-C [facility] [address] [place], [place] [address] [phone]

## 2020-01-07 NOTE — Patient Instructions (Addendum)
Metamucil or Miralax daily with stool softener.  Tucks pads, preparation H for hemorrhoids.   Constipation, Adult Constipation is when a person has fewer bowel movements in a week than normal, has difficulty having a bowel movement, or has stools that are dry, hard, or larger than normal. Constipation may be caused by an underlying condition. It may become worse with age if a person takes certain medicines and does not take in enough fluids. Follow these instructions at home: Eating and drinking   Eat foods that have a lot of fiber, such as fresh fruits and vegetables, whole grains, and beans.  Limit foods that are high in fat, low in fiber, or overly processed, such as french fries, hamburgers, cookies, candies, and soda.  Drink enough fluid to keep your urine clear or pale yellow. General instructions  Exercise regularly or as told by your health care provider.  Go to the restroom when you have the urge to go. Do not hold it in.  Take over-the-counter and prescription medicines only as told by your health care provider. These include any fiber supplements.  Practice pelvic floor retraining exercises, such as deep breathing while relaxing the lower abdomen and pelvic floor relaxation during bowel movements.  Watch your condition for any changes.  Keep all follow-up visits as told by your health care provider. This is important. Contact a health care provider if:  You have pain that gets worse.  You have a fever.  You do not have a bowel movement after 4 days.  You vomit.  You are not hungry.  You lose weight.  You are bleeding from the anus.  You have thin, pencil-like stools. Get help right away if:  You have a fever and your symptoms suddenly get worse.  You leak stool or have blood in your stool.  Your abdomen is bloated.  You have severe pain in your abdomen.  You feel dizzy or you faint. This information is not intended to replace advice given to you by  your health care provider. Make sure you discuss any questions you have with your health care provider. Document Revised: 03/08/2017 Document Reviewed: 09/14/2015 Elsevier Patient Education  2020 [employer]. Hemorrhoids Hemorrhoids are swollen veins in and around the rectum or anus. There are two types of hemorrhoids:  Internal hemorrhoids. These occur in the veins that are just inside the rectum. They may poke through to the outside and become irritated and painful.  External hemorrhoids. These occur in the veins that are outside the anus and can be felt as a painful swelling or hard lump near the anus. Most hemorrhoids do not cause serious problems, and they can be managed with home treatments such as diet and lifestyle changes. If home treatments do not help the symptoms, procedures can be done to shrink or remove the hemorrhoids. What are the causes? This condition is caused by increased pressure in the anal area. This pressure may result from various things, including:  Constipation.  Straining to have a bowel movement.  Diarrhea.  Pregnancy.  Obesity.  Sitting for long periods of time.  Heavy lifting or other activity that causes you to strain.  Anal sex.  Riding a bike for a long period of time. What are the signs or symptoms? Symptoms of this condition include:  Pain.  Anal itching or irritation.  Rectal bleeding.  Leakage of stool (feces).  Anal swelling.  One or more lumps around the anus. How is this diagnosed? This condition can often be  diagnosed through a visual exam. Other exams or tests may also be done, such as:  An exam that involves feeling the rectal area with a gloved hand (digital rectal exam).  An exam of the anal canal that is done using a small tube (anoscope).  A blood test, if you have lost a significant amount of blood.  A test to look inside the colon using a flexible tube with a camera on the end (sigmoidoscopy or  colonoscopy). How is this treated? This condition can usually be treated at home. However, various procedures may be done if dietary changes, lifestyle changes, and other home treatments do not help your symptoms. These procedures can help make the hemorrhoids smaller or remove them completely. Some of these procedures involve surgery, and others do not. Common procedures include:  Rubber band ligation. Rubber bands are placed at the base of the hemorrhoids to cut off their blood supply.  Sclerotherapy. Medicine is injected into the hemorrhoids to shrink them.  Infrared coagulation. A type of light energy is used to get rid of the hemorrhoids.  Hemorrhoidectomy surgery. The hemorrhoids are surgically removed, and the veins that supply them are tied off.  Stapled hemorrhoidopexy surgery. The surgeon staples the base of the hemorrhoid to the rectal wall. Follow these instructions at home: Eating and drinking   Eat foods that have a lot of fiber in them, such as whole grains, beans, nuts, fruits, and vegetables.  Ask your health care provider about taking products that have added fiber (fiber supplements).  Reduce the amount of fat in your diet. You can do this by eating low-fat dairy products, eating less red meat, and avoiding processed foods.  Drink enough fluid to keep your urine pale yellow. Managing pain and swelling   Take warm sitz baths for 20 minutes, 3-4 times a day to ease pain and discomfort. You may do this in a bathtub or using a portable sitz bath that fits over the toilet.  If directed, apply ice to the affected area. Using ice packs between sitz baths may be helpful. ? Put ice in a plastic bag. ? Place a towel between your skin and the bag. ? Leave the ice on for 20 minutes, 2-3 times a day. General instructions  Take over-the-counter and prescription medicines only as told by your health care provider.  Use medicated creams or suppositories as told.  Get regular  exercise. Ask your health care provider how much and what kind of exercise is best for you. In general, you should do moderate exercise for at least 30 minutes on most days of the week (150 minutes each week). This can include activities such as walking, biking, or yoga.  Go to the bathroom when you have the urge to have a bowel movement. Do not wait.  Avoid straining to have bowel movements.  Keep the anal area dry and clean. Use wet toilet paper or moist towelettes after a bowel movement.  Do not sit on the toilet for long periods of time. This increases blood pooling and pain.  Keep all follow-up visits as told by your health care provider. This is important. Contact a health care provider if you have:  Increasing pain and swelling that are not controlled by treatment or medicine.  Difficulty having a bowel movement, or you are unable to have a bowel movement.  Pain or inflammation outside the area of the hemorrhoids. Get help right away if you have:  Uncontrolled bleeding from your rectum. Summary  Hemorrhoids are swollen veins in and around the rectum or anus.  Most hemorrhoids can be managed with home treatments such as diet and lifestyle changes.  Taking warm sitz baths can help ease pain and discomfort.  In severe cases, procedures or surgery can be done to shrink or remove the hemorrhoids. This information is not intended to replace advice given to you by your health care provider. Make sure you discuss any questions you have with your health care provider. Document Revised: 08/22/2018 Document Reviewed: 08/15/2017 Elsevier Patient Education  [place].

## 2020-01-14 ENCOUNTER — Encounter: Payer: Self-pay | Admitting: Gastroenterology

## 2020-01-18 ENCOUNTER — Other Ambulatory Visit: Payer: Self-pay | Admitting: Family Medicine

## 2020-01-18 DIAGNOSIS — I10 Essential (primary) hypertension: Secondary | ICD-10-CM

## 2020-01-18 NOTE — Telephone Encounter (Signed)
OV 07/14/19 RTC 1 yr

## 2020-03-08 ENCOUNTER — Ambulatory Visit: Payer: BC Managed Care – PPO | Admitting: Gastroenterology

## 2020-04-25 ENCOUNTER — Ambulatory Visit: Payer: BC Managed Care – PPO | Admitting: Gastroenterology

## 2020-05-08 NOTE — Progress Notes (Signed)
05/08/2020 Cynthia [name] [id number] Mar 06, 1966   CHIEF COMPLAINT: schedule a colonoscopy   HISTORY OF PRESENT ILLNESS: Cynthia [name] is a 55 year old female with medical history of high cholesterol, hypertension and vertigo.  Past hysterectomy, left breast lumpectomy, right arm fracture repair, tonsillectomy and 2 C sections. She presents to our office today as referred by Dr. Adam Phenix for further evaluation regarding rectal bleeding and to schedule a colonoscopy. She complains of having constipation, intermittently passes a hard stool. She has seen bright red blood and sometimes darker red blood on the toilet tissue and in the toilet water which occurs every 3 days for the past few months. Some anorectal irritation without significant rectal pain. She denies ever having a screening colonoscopy. She wishes to schedule a colonoscopy as soon as possible. No family history of colorectal cancer.   CBC Latest Ref Rng & Units 07/14/2019 10/03/2018 01/23/2016  WBC 3.4 - 10.8 x10E3/uL 7.6 12.3(H) 6.4  Hemoglobin 11.1 - 15.9 g/dL 13.1 12.7 12.5  Hematocrit 34.0 - 46.6 % 40.5 38.7 37.6  Platelets 150 - 450 x10E3/uL 424 464(H) 378   CMP Latest Ref Rng & Units 07/14/2019 01/23/2016  Glucose 65 - 99 mg/dL 95 99  BUN 6 - 24 mg/dL 12 9  Creatinine 0.57 - 1.00 mg/dL 0.91 0.69  Sodium 134 - 144 mmol/L 144 141  Potassium 3.5 - 5.2 mmol/L 3.9 4.0  Chloride 96 - 106 mmol/L 107(H) 102  CO2 20 - 29 mmol/L 21 25  Calcium 8.7 - 10.2 mg/dL 9.9 9.5  Total Protein 6.0 - 8.5 g/dL 7.0 7.2  Total Bilirubin 0.0 - 1.2 mg/dL 0.3 0.3  Alkaline Phos 39 - 117 IU/L 112 104  AST 0 - 40 IU/L 19 17  ALT 0 - 32 IU/L 16 18    Past Medical History:  Diagnosis Date  . High cholesterol   . Hypertension   . Migraine   . Vertigo    Past Surgical History:  Procedure Laterality Date  . ABDOMINAL HYSTERECTOMY    . BREAST EXCISIONAL BIOPSY    . BREAST SURGERY Left    lump removed   . CYST REMOVAL LEG     . FRACTURE SURGERY Right    Arm  . TONSILLECTOMY     Social History: Past smoker, has quit smoking cigarettes 4 years ago. She drinks one mixed drink every 3 months. No drug use.   Family History: Father deceased 15 with history of hypertension and kidney disease.  Mother died age 56 with history of heart failure. Maternal aunt and maternal grandmother with cancer, unknown type.   No Known Allergies    Outpatient Encounter Medications as of 05/09/2020  Medication Sig  . albuterol (VENTOLIN HFA) 108 (90 Base) MCG/ACT inhaler Inhale 2 puffs into the lungs every 6 (six) hours as needed for wheezing or shortness of breath.  Marland Kitchen amLODipine (NORVASC) 10 MG tablet Take one tab po daily  . atorvastatin (LIPITOR) 20 MG tablet TAKE 1 TABLET(20 MG) BY MOUTH DAILY  . beclomethasone (QVAR) 80 MCG/ACT inhaler Inhale 1 puff into the lungs 2 (two) times daily. Rinse mouth, controller med  . cetirizine (ZYRTEC) 10 MG tablet Take 1 tablet (10 mg total) by mouth daily.  . fluticasone (FLONASE) 50 MCG/ACT nasal spray Place 2 sprays into both nostrils daily.  . hydrochlorothiazide (HYDRODIURIL) 25 MG tablet TAKE 1 TABLET(25 MG) BY MOUTH DAILY  . ketorolac (ACULAR) 0.5 % ophthalmic solution Place 1 drop into both eyes 4 (  four) times daily.  . montelukast (SINGULAIR) 10 MG tablet Take 1 tablet (10 mg total) by mouth at bedtime.   Facility-Administered Encounter Medications as of 05/09/2020  Medication  . methylPREDNISolone acetate (DEPO-MEDROL) injection 80 mg   REVIEW OF SYSTEMS:   Gen: Denies fever, sweats or chills. No weight loss.  CV: Denies chest pain, palpitations or edema. Resp: Denies cough, shortness of breath of hemoptysis.  GI: See HPI.  GU : Denies urinary burning, blood in urine, increased urinary frequency or incontinence. MS: Denies joint pain, muscles aches or weakness. Derm: Denies rash, itchiness, skin lesions or unhealing ulcers. Psych: Denies depression, anxiety or memory loss.   Heme: Denies bruising, bleeding. Neuro:  Denies headaches, dizziness or paresthesias. Endo:  Denies any problems with DM, thyroid or adrenal function.   PHYSICAL EXAM: BP (!) 148/88   Pulse (!) 101   Ht 5\' 2"  (1.575 m)   Wt 222 lb (100.7 kg)   SpO2 97%   BMI 40.60 kg/m   General: Well developed 56 year old female in no acute distress. Head: Normocephalic and atraumatic. Eyes:  Sclerae non-icteric, conjunctive pink. Ears: Normal auditory acuity. Mouth: Dentition intact. No ulcers or lesions.  Neck: Supple, no lymphadenopathy or thyromegaly.  Lungs: Clear bilaterally to auscultation without wheezes, crackles or rhonchi. Heart: Regular rate and rhythm. No murmur, rub or gallop appreciated.  Abdomen: Soft, nontender, non distended. No masses. No hepatosplenomegaly. Normoactive bowel sounds x 4 quadrants.  Rectal: Posterior external hemorrhoids with small friction/ulceration without active bleeding. Internal hemorrhoids palpated without prolapse. No stool or mass in the rectal vault. CMA Melissa present at time of exam.  Musculoskeletal: Symmetrical with no gross deformities. Skin: Warm and dry. No rash or lesions on visible extremities. Extremities: No edema. Neurological: Alert oriented x 4, no focal deficits.  Psychological:  Alert and cooperative. Normal mood and affect.  ASSESSMENT AND PLAN:  31. 55 year old female with rectal bleeding, internal and external hemorrhoids. -CBC -Sitz bath/soak in tub for 10 minutes bid PRN -Benefiber 1 tbsp in the am. Miralax Q HS as needed. -Anusol HC 25mg  suppository 1 PR Q HS x 5 nights. Apply a small amount of Desitin inside the anal opening and to the external anal area tid as needed for anal or hemorrhoidal irritation/bleeding.  -Colonoscopy benefits and risks discussed including risk with sedation, risk of bleeding, perforation and infection  -Further follow-up to be determined after colonoscopy completed -Patient to call office if  symptoms worsen.   2. Constipation -See plan in # 1       CC:  Gwenlyn Perking, FNP

## 2020-05-09 ENCOUNTER — Other Ambulatory Visit (INDEPENDENT_AMBULATORY_CARE_PROVIDER_SITE_OTHER): Payer: BC Managed Care – PPO

## 2020-05-09 ENCOUNTER — Ambulatory Visit (INDEPENDENT_AMBULATORY_CARE_PROVIDER_SITE_OTHER): Payer: BC Managed Care – PPO | Admitting: Nurse Practitioner

## 2020-05-09 ENCOUNTER — Encounter: Payer: Self-pay | Admitting: Nurse Practitioner

## 2020-05-09 VITALS — BP 148/88 | HR 101 | Ht 62.0 in | Wt 222.0 lb

## 2020-05-09 DIAGNOSIS — K5900 Constipation, unspecified: Secondary | ICD-10-CM

## 2020-05-09 DIAGNOSIS — K648 Other hemorrhoids: Secondary | ICD-10-CM

## 2020-05-09 DIAGNOSIS — K625 Hemorrhage of anus and rectum: Secondary | ICD-10-CM

## 2020-05-09 DIAGNOSIS — K644 Residual hemorrhoidal skin tags: Secondary | ICD-10-CM | POA: Diagnosis not present

## 2020-05-09 LAB — CBC WITH DIFFERENTIAL/PLATELET
Basophils Absolute: 0.1 10*3/uL (ref 0.0–0.1)
Basophils Relative: 1 % (ref 0.0–3.0)
Eosinophils Absolute: 0.2 10*3/uL (ref 0.0–0.7)
Eosinophils Relative: 2.5 % (ref 0.0–5.0)
HCT: 39.2 % (ref 36.0–46.0)
Hemoglobin: 13.1 g/dL (ref 12.0–15.0)
Lymphocytes Relative: 41.7 % (ref 12.0–46.0)
Lymphs Abs: 3 10*3/uL (ref 0.7–4.0)
MCHC: 33.6 g/dL (ref 30.0–36.0)
MCV: 80.4 fl (ref 78.0–100.0)
Monocytes Absolute: 0.5 10*3/uL (ref 0.1–1.0)
Monocytes Relative: 6.2 % (ref 3.0–12.0)
Neutro Abs: 3.6 10*3/uL (ref 1.4–7.7)
Neutrophils Relative %: 48.6 % (ref 43.0–77.0)
Platelets: 398 10*3/uL (ref 150.0–400.0)
RBC: 4.87 Mil/uL (ref 3.87–5.11)
RDW: 14 % (ref 11.5–15.5)
WBC: 7.3 10*3/uL (ref 4.0–10.5)

## 2020-05-09 MED ORDER — HYDROCORTISONE ACETATE 25 MG RE SUPP: 25.0000 mg | Freq: Every evening | RECTAL | 0 refills | Status: DC

## 2020-05-09 MED ORDER — SUPREP BOWEL PREP KIT 17.5-3.13-1.6 GM/177ML PO SOLN: 1.0000 | ORAL | 0 refills | Status: DC

## 2020-05-09 NOTE — Patient Instructions (Addendum)
LABS:   Your provider has requested that you go to the basement level for lab work before leaving today. Press "B" on the elevator. The lab is located at the first door on the left as you exit the elevator.  HEALTHCARE LAWS AND MY CHART RESULTS: Due to recent changes in healthcare laws, you may see the results of your imaging and laboratory studies on MyChart before your provider has had a chance to review them.   We understand that in some cases there may be results that are confusing or concerning to you. Not all laboratory results come back in the same time frame and the provider may be waiting for multiple results in order to interpret others.  Please give [place] 48 hours in order for your provider to thoroughly review all the results before contacting the office for clarification of your results.   PROCEDURES:  You have been scheduled for a colonoscopy. Please follow the written instructions given to you at your visit today. Please pick up your prep supplies at the pharmacy within the next 1-3 days. If you use inhalers (even only as needed), please bring them with you on the day of your procedure.  MEDICATION  We have sent the following medication to your pharmacy for you to pick up at your convenience:  Anusol rectal suppository, use one at night for 5 nights.  OVER THE COUNTER MEDICATION  Please purchase the following medications over the counter and take as directed:  Benefiber- 1 tablespoon daily. Miralax. Dissolve one capful in 8 ounces of water and drink before bed. Desitin: Apply a small amount to the external anal area three times a day as needed.  It was great seeing you today!  Thank you for entrusting me with your care and choosing [facility].  Noralyn Pick, CRNP

## 2020-05-09 NOTE — Progress Notes (Signed)
Reviewed and agree with management plan.  Makensie Mulhall T. Viviana Trimble, MD FACG [phone]  

## 2020-05-10 ENCOUNTER — Telehealth: Payer: Self-pay | Admitting: Nurse Practitioner

## 2020-05-10 NOTE — Telephone Encounter (Signed)
Cynthia [name], the patient can purchase on line ie: [employer] or OTC Calmol 4 suppositories (which has Zinc Oxide and coca butter) and she can insert one suppository inside the rectum Q HS x 7 to 10 days. If not affordable then she can use Desitin, Apply a small amount of Desitin inside the anal opening and to the external anal area tid as needed for anal or hemorrhoidal irritation/bleeding.   Thx.

## 2020-05-10 NOTE — Telephone Encounter (Signed)
Please advise on the suppositories.

## 2020-05-10 NOTE — Telephone Encounter (Signed)
Pt called to inform that copay for suprep is over $75 and her insurance does not covered suppositories, out-of-packet cost is over $300. Pt wants to know if she can more affordable prescriptions. Pls call her.

## 2020-05-11 NOTE — Progress Notes (Signed)
See phone note

## 2020-05-11 NOTE — Telephone Encounter (Signed)
Spoke with patient and gave her the information about the suppositories, she expressed understanding and agreement.  I let patient know we do not have any samples of Suprep but we do have Plen-vu.  Patient will come by Friday to pick up the sample and instructions.  FOLLOW THESE PREP INSTRUCTIONS, NOT INSTRUCTIONS ON PLENVU BOX. IT IS IMPERATIVE THAT YOU DRINK THE MANGO PACKET THE EVENING BEFORE YOUR PROCEDURE AND THE FRUIT PUNCH PACKET THE DAY OF YOUR PROCEDURE. PLENVU SHOULD BE CONSUMED WITHIN 6 HOURS OF MIXING IT.    The day before procedure at 6:00pm: Follow the 5 steps below to prepare Plenvu  Step 1: Pour Dose 1 - Mango packet into the provided mixing container. Step 2: Add 16 oz. of cool water to the container. Step 3: Either replace lid and shake container or stir contents until completely dissolved.             This may take 2-3 minutes.  Step 4: Drink the entire 16 ounces of prep within a 30 minute period. Step 5: Drink an additional 16 oz container of water over 30 minutes.   Continue to drink clear liquids until bedtime.  _____________________________________________________________________  DAY OF PROCEDURE:   DATE: 05/16/20       DAY: Monday  STAY ON CLEAR LIQUIDS, NO SOLID FOOD  Beginning at  11:00am  (5 hours before the procedure):  COMPLETE STEPS 1 THROUGH 5 BELOW:  Step 1: Pour Dose 2- Packet A and B - Fruit punch flavor packet into the provided  mixing container. Step 2: Add 16 oz. Of cool water to the container. Step 3: Either replace lid and shake container or stir contents until completely dissolved.             This may take 2-3 minutes.  Step 4: Drink the entire 16 ounces of prep within a 30 minute period. Step 5: Drink an additional 16 oz container of water over 30 minutes.

## 2020-05-11 NOTE — Telephone Encounter (Signed)
Patient calling back to check on status of prescriptions.

## 2020-05-11 NOTE — Telephone Encounter (Signed)
Spoke with patient and she would like to get a sample of Suprep it is to expensive and stated that she is still having rectum bleeding. Also would like to know about suppositories. Please call patient.

## 2020-05-11 NOTE — Telephone Encounter (Signed)
The information regarding the suppositories was already answered by [facility] yesterday. I have called patient and left her a VM to call back. I am sitting at different desks today, if I am not available when she calls back please have a Nurse speak with her. Thank you!   Cynthia Pick, NP  You Yesterday (1:13 PM)       Cynthia [name], the patient can purchase on line ie: Amazon or OTC Calmol 4 suppositories (which has Zinc Oxide and coca butter) and she can insert one suppository inside the rectum Q HS x 7 to 10 days. If not affordable then she can use Desitin, Apply a small amount of Desitin inside the anal opening and to the external anal area tid as needed for anal or hemorrhoidal irritation/bleeding.   Thx.

## 2020-05-12 ENCOUNTER — Encounter: Payer: Self-pay | Admitting: Gastroenterology

## 2020-05-16 ENCOUNTER — Ambulatory Visit (AMBULATORY_SURGERY_CENTER): Payer: BC Managed Care – PPO | Admitting: Gastroenterology

## 2020-05-16 ENCOUNTER — Encounter: Payer: Self-pay | Admitting: Gastroenterology

## 2020-05-16 ENCOUNTER — Other Ambulatory Visit: Payer: Self-pay

## 2020-05-16 VITALS — BP 126/73 | HR 71 | Temp 98.0°F | Resp 25 | Ht 62.0 in | Wt 222.0 lb

## 2020-05-16 DIAGNOSIS — D122 Benign neoplasm of ascending colon: Secondary | ICD-10-CM

## 2020-05-16 DIAGNOSIS — K5140 Inflammatory polyps of colon without complications: Secondary | ICD-10-CM

## 2020-05-16 DIAGNOSIS — K921 Melena: Secondary | ICD-10-CM | POA: Diagnosis present

## 2020-05-16 DIAGNOSIS — D125 Benign neoplasm of sigmoid colon: Secondary | ICD-10-CM

## 2020-05-16 DIAGNOSIS — K635 Polyp of colon: Secondary | ICD-10-CM | POA: Diagnosis not present

## 2020-05-16 DIAGNOSIS — D123 Benign neoplasm of transverse colon: Secondary | ICD-10-CM

## 2020-05-16 MED ORDER — SODIUM CHLORIDE 0.9 % IV SOLN: 500.0000 mL | Freq: Once | INTRAVENOUS | Status: DC

## 2020-05-16 NOTE — Progress Notes (Signed)
Called to room to assist during endoscopic procedure.  Patient ID and intended procedure confirmed with present staff. Received instructions for my participation in the procedure from the performing physician.  

## 2020-05-16 NOTE — Progress Notes (Signed)
Report to PACU, RN, vss, BBS= Clear.  

## 2020-05-16 NOTE — Patient Instructions (Signed)
Read all of the handouts givent o you by your recovery room nurse.  Resume your medications today.   YOU HAD AN ENDOSCOPIC PROCEDURE TODAY AT [facility]:   Refer to the procedure report that was given to you for any specific questions about what was found during the examination.  If the procedure report does not answer your questions, please call your gastroenterologist to clarify.  If you requested that your care partner not be given the details of your procedure findings, then the procedure report has been included in a sealed envelope for you to review at your convenience later.  YOU SHOULD EXPECT: Some feelings of bloating in the abdomen. Passage of more gas than usual.  Walking can help get rid of the air that was put into your GI tract during the procedure and reduce the bloating. If you had a lower endoscopy (such as a colonoscopy or flexible sigmoidoscopy) you may notice spotting of blood in your stool or on the toilet paper. If you underwent a bowel prep for your procedure, you may not have a normal bowel movement for a few days.  Please Note:  You might notice some irritation and congestion in your nose or some drainage.  This is from the oxygen used during your procedure.  There is no need for concern and it should clear up in a day or so.  SYMPTOMS TO REPORT IMMEDIATELY:   Following lower endoscopy (colonoscopy or flexible sigmoidoscopy):  Excessive amounts of blood in the stool  Significant tenderness or worsening of abdominal pains  Swelling of the abdomen that is new, acute  Fever of 100F or higher   For urgent or emergent issues, a gastroenterologist can be reached at any hour by calling [phone]. Do not use MyChart messaging for urgent concerns.    DIET:  We do recommend a small meal at first, but then you may proceed to your regular diet.  Drink plenty of fluids but you should avoid alcoholic beverages for 24 hours. Try top eat a high fiber diet, and  drink plenty of water.  ACTIVITY:  You should plan to take it easy for the rest of today and you should NOT DRIVE or use heavy machinery until tomorrow (because of the sedation medicines used during the test).    FOLLOW UP: Our staff will call the number listed on your records 48-72 hours following your procedure to check on you and address any questions or concerns that you may have regarding the information given to you following your procedure. If we do not reach you, we will leave a message.  We will attempt to reach you two times.  During this call, we will ask if you have developed any symptoms of COVID 19. If you develop any symptoms (ie: fever, flu-like symptoms, shortness of breath, cough etc.) before then, please call [phone].  If you test positive for Covid 19 in the 2 weeks post procedure, please call and report this information to [place].    If any biopsies were taken you will be contacted by phone or by letter within the next 1-3 weeks.  Please call us at [phone] if you have not heard about the biopsies in 3 weeks.    SIGNATURES/CONFIDENTIALITY: You and/or your care partner have signed paperwork which will be entered into your electronic medical record.  These signatures attest to the fact that that the information above on your After Visit Summary has been reviewed and is understood.  Full  responsibility of the confidentiality of this discharge information lies with you and/or your care-partner.

## 2020-05-16 NOTE — Progress Notes (Signed)
Pt states she did have fish to eat yesterday (Sunday) at 3 pm.  Merrily Pew, crna, notified

## 2020-05-16 NOTE — Progress Notes (Signed)
VS by CW. ?

## 2020-05-16 NOTE — Op Note (Signed)
[facility] [place] Patient Name: Bryson Palen Procedure Date: 05/16/2020 3:43 PM MRN: [id number] Endoscopist: Ladene Artist , MD Age: 55 Referring MD:  Date of Birth: 25-Sep-1965 Gender: Female Account #: [id number] Procedure:                Colonoscopy Indications:              Hematochezia Medicines:                Monitored Anesthesia Care Procedure:                Pre-Anesthesia Assessment:                           - Prior to the procedure, a History and Physical                            was performed, and patient medications and                            allergies were reviewed. The patient's tolerance of                            previous anesthesia was also reviewed. The risks                            and benefits of the procedure and the sedation                            options and risks were discussed with the patient.                            All questions were answered, and informed consent                            was obtained. Prior Anticoagulants: The patient has                            taken no previous anticoagulant or antiplatelet                            agents. ASA Grade Assessment: II - A patient with                            mild systemic disease. After reviewing the risks                            and benefits, the patient was deemed in                            satisfactory condition to undergo the procedure.                           After obtaining informed consent, the colonoscope  was passed under direct vision. Throughout the                            procedure, the patient's blood pressure, pulse, and                            oxygen saturations were monitored continuously. The                            Olympus PFC-[id number] (#[id number]) Colonoscope was                            introduced through the anus and advanced to the the                            cecum, identified by appendiceal orifice and                             ileocecal valve. The ileocecal valve, appendiceal                            orifice, and rectum were photographed. The quality                            of the bowel preparation was good. The colonoscopy                            was performed without difficulty. The patient                            tolerated the procedure well. Scope In: 3:49:30 PM Scope Out: 4:13:34 PM Scope Withdrawal Time: 0 hours 20 minutes 40 seconds  Total Procedure Duration: 0 hours 24 minutes 4 seconds  Findings:                 External hemorrhoids and skin tags were found on                            perianal exam.                           A 12 mm polyp was found in the ascending colon. The                            polyp was pedunculated. The polyp was removed with                            a hot snare. Resection and retrieval were complete.                           Two sessile polyps were found in the transverse                            colon. The polyps were 4 to 5 mm  in size. These                            polyps were removed with a cold biopsy forceps.                            Resection and retrieval were complete.                           Three sessile polyps were found in the sigmoid                            colon. The polyps were 7 to 8 mm in size. These                            polyps were removed with a cold snare. Resection                            and retrieval were complete.                           Internal hemorrhoids were found during                            retroflexion. The hemorrhoids were moderate and                            Grade I (internal hemorrhoids that do not prolapse).                           The exam was otherwise without abnormality on                            direct and retroflexion views. Complications:            No immediate complications. Estimated blood loss:                            None. Estimated Blood Loss:      Estimated blood loss: none. Impression:               - External hemorrhoids and skin tags found on                            perianal exam.                           - One 12 mm polyp in the ascending colon, removed                            with a hot snare. Resected and retrieved.                           - Two 4 to 5 mm polyps in the transverse colon,  removed with a cold biopsy forceps. Resected and                            retrieved.                           - Three 7 to 8 mm polyps in the sigmoid colon,                            removed with a cold snare. Resected and retrieved.                           - Internal hemorrhoids.                           - The examination was otherwise normal on direct                            and retroflexion views. Recommendation:           - Repeat colonoscopy, likely 3 years, after studies                            are complete for surveillance based on pathology                            results.                           - Patient has a contact number available for                            emergencies. The signs and symptoms of potential                            delayed complications were discussed with the                            patient. Return to normal activities tomorrow.                            Written discharge instructions were provided to the                            patient.                           - Resume previous diet.                           - Continue present medications.                           - Prep H supp and cream PR bid prn hemorrhoid  symptoms.                           - Await pathology results.                           - No aspirin, ibuprofen, naproxen, or other                            non-steroidal anti-inflammatory drugs for 2 weeks                            after polyp removal. Ladene Artist, MD 05/16/2020 4:19:17 PM This  report has been signed electronically.

## 2020-05-17 ENCOUNTER — Other Ambulatory Visit: Payer: Self-pay | Admitting: Family Medicine

## 2020-05-17 DIAGNOSIS — I10 Essential (primary) hypertension: Secondary | ICD-10-CM

## 2020-05-18 ENCOUNTER — Telehealth: Payer: Self-pay | Admitting: *Deleted

## 2020-05-18 NOTE — Telephone Encounter (Signed)
  Follow up Call-  Call back number 05/16/2020  Post procedure Call Back phone  # [phone]  Permission to leave phone message Yes  Some recent data might be hidden     Patient questions:  Do you have a fever, pain , or abdominal swelling? No. Pain Score  0 *  Have you tolerated food without any problems? Yes.    Have you been able to return to your normal activities? Yes.    Do you have any questions about your discharge instructions: Diet   No. Medications  No. Follow up visit  No.  Do you have questions or concerns about your Care? Yes.     Pt complaining of indigestion.  She is taking tums and that is helping.  She will call back if this continues.   Actions: * If pain score is 4 or above: No action needed, pain <4.  1. Have you developed a fever since your procedure? no  2.   Have you had an respiratory symptoms (SOB or cough) since your procedure? no  3.   Have you tested positive for COVID 19 since your procedure no  4.   Have you had any family members/close contacts diagnosed with the COVID 19 since your procedure?  no   If yes to any of these questions please route to Joylene John, RN and Joella Prince, RN

## 2020-05-18 NOTE — Telephone Encounter (Signed)
Patient returned the call and would like to know if having heartburn is normal. Please advise

## 2020-05-18 NOTE — Telephone Encounter (Signed)
First f/u call attempt.  LVM

## 2020-06-09 ENCOUNTER — Encounter: Payer: Self-pay | Admitting: Gastroenterology

## 2020-06-16 ENCOUNTER — Other Ambulatory Visit: Payer: Self-pay | Admitting: Family Medicine

## 2020-06-16 DIAGNOSIS — I10 Essential (primary) hypertension: Secondary | ICD-10-CM

## 2020-06-16 NOTE — Telephone Encounter (Signed)
[place]. NTBS 30 days given 05/17/20

## 2020-06-19 ENCOUNTER — Other Ambulatory Visit: Payer: Self-pay | Admitting: Family Medicine

## 2020-06-19 DIAGNOSIS — I10 Essential (primary) hypertension: Secondary | ICD-10-CM

## 2020-06-20 ENCOUNTER — Telehealth: Payer: Self-pay

## 2020-06-20 DIAGNOSIS — I10 Essential (primary) hypertension: Secondary | ICD-10-CM

## 2020-06-20 MED ORDER — HYDROCHLOROTHIAZIDE 25 MG PO TABS: ORAL_TABLET | ORAL | 0 refills | Status: DC

## 2020-06-20 MED ORDER — AMLODIPINE BESYLATE 10 MG PO TABS: ORAL_TABLET | ORAL | 0 refills | Status: DC

## 2020-06-20 MED ORDER — ATORVASTATIN CALCIUM 20 MG PO TABS: ORAL_TABLET | ORAL | 0 refills | Status: DC

## 2020-06-20 NOTE — Telephone Encounter (Signed)
Pt aware 30 day supply sent to pharmacy, she has a refill ready at pharmacy, but this will only get her to the 15th, and her appt is not till the 26th

## 2020-06-20 NOTE — Telephone Encounter (Signed)
  Prescription Request  06/20/2020  What is the name of the medication or equipment? Amlodipine 10 mg, Atorvastatin 20 mg and Hydrochlorothiazide 25 mg. Patient had physical in April of 2021 and has appt in April for another physical. That is when Dr. Lajuana Ripple had in her AVS to return. Meds were refused  Have you contacted your pharmacy to request a refill? (if applicable) YES   Which pharmacy would you like this sent to? [facility] in [place]   Patient notified that their request is being sent to the clinical staff for review and that they should receive a response within 2 business days.

## 2020-06-23 ENCOUNTER — Encounter: Payer: Self-pay | Admitting: Nurse Practitioner

## 2020-06-23 ENCOUNTER — Ambulatory Visit (INDEPENDENT_AMBULATORY_CARE_PROVIDER_SITE_OTHER): Payer: BC Managed Care – PPO | Admitting: Nurse Practitioner

## 2020-06-23 ENCOUNTER — Ambulatory Visit (INDEPENDENT_AMBULATORY_CARE_PROVIDER_SITE_OTHER): Payer: BC Managed Care – PPO

## 2020-06-23 ENCOUNTER — Other Ambulatory Visit: Payer: Self-pay

## 2020-06-23 VITALS — BP 122/76 | HR 86 | Temp 97.8°F | Resp 20 | Ht 62.0 in | Wt 220.0 lb

## 2020-06-23 DIAGNOSIS — M25561 Pain in right knee: Secondary | ICD-10-CM

## 2020-06-23 DIAGNOSIS — J4520 Mild intermittent asthma, uncomplicated: Secondary | ICD-10-CM | POA: Diagnosis not present

## 2020-06-23 DIAGNOSIS — J301 Allergic rhinitis due to pollen: Secondary | ICD-10-CM

## 2020-06-23 DIAGNOSIS — M25562 Pain in left knee: Secondary | ICD-10-CM

## 2020-06-23 MED ORDER — FLUTICASONE PROPIONATE 50 MCG/ACT NA SUSP: 2.0000 | Freq: Every day | NASAL | 6 refills | Status: DC

## 2020-06-23 MED ORDER — SPACER/AERO-HOLDING CHAMBERS DEVI: 1.0000 | Freq: Every day | 0 refills | Status: DC

## 2020-06-23 MED ORDER — CELECOXIB 200 MG PO CAPS: 200.0000 mg | ORAL_CAPSULE | Freq: Two times a day (BID) | ORAL | 2 refills | Status: DC

## 2020-06-23 MED ORDER — CETIRIZINE HCL 10 MG PO TABS: 10.0000 mg | ORAL_TABLET | Freq: Every day | ORAL | 11 refills | Status: DC

## 2020-06-23 MED ORDER — MONTELUKAST SODIUM 10 MG PO TABS: 10.0000 mg | ORAL_TABLET | Freq: Every day | ORAL | 3 refills | Status: DC

## 2020-06-23 MED ORDER — BECLOMETHASONE DIPROPIONATE 80 MCG/ACT IN AERS: 1.0000 | INHALATION_SPRAY | Freq: Two times a day (BID) | RESPIRATORY_TRACT | 5 refills | Status: DC

## 2020-06-23 NOTE — Progress Notes (Signed)
Subjective:    Patient ID: Cynthia [name], female    DOB: 10/10/1965, 55 y.o.   MRN: [id number]   Chief Complaint: Bilateral leg pain (Sinus, scratchy throat. Ear pain/)   HPI Pt presents today with bilateral leg pain, specifically her knees, ankles and calves. Pain has been present for the past year, received cortisone shots but the pain persists. Today her pain rates at a 8/10. Upon waking her pain is a 10/10. She has never been screened for arthritis. She walks all day long at work. She has been using voltaren with some minimal relief. She tries to avoid NSAID's bc of her HTN. She has no complaints of back pain.   Secondly, she has been having some sinus symptoms for the last couple of weeks. She has had some nasal drainage, post nasal drainage, Right ear fullness, sore throat, buring eyes.. No sinus pain or pressure. She has not been taking flonase or zyrtec regularly.     Review of Systems  Constitutional: Negative for fatigue and fever.  HENT: Positive for congestion, ear pain, postnasal drip, rhinorrhea and sneezing. Negative for sinus pressure and sinus pain.   Respiratory: Positive for cough and wheezing.        Occasional wheezing  Cardiovascular: Negative for chest pain, palpitations and leg swelling.  Musculoskeletal: Positive for arthralgias, gait problem and joint swelling.       Bilateral knees  Neurological: Negative for dizziness, light-headedness and headaches.  Psychiatric/Behavioral: Negative for confusion. The patient is not nervous/anxious.        Objective:   Physical Exam HENT:     Right Ear: Hearing and tympanic membrane normal.     Left Ear: Hearing and tympanic membrane normal.     Nose: Rhinorrhea present.     Right Turbinates: Pale.     Left Turbinates: Pale.     Mouth/Throat:     Lips: Pink.     Mouth: Mucous membranes are moist.     Pharynx: Oropharynx is clear.  Cardiovascular:     Rate and Rhythm: Normal rate and regular rhythm.      Pulses: Normal pulses.  Pulmonary:     Effort: Pulmonary effort is normal.     Breath sounds: Normal breath sounds.  Abdominal:     General: Bowel sounds are normal.     Palpations: Abdomen is soft.  Musculoskeletal:     Right knee: Swelling, bony tenderness and crepitus present.     Left knee: Swelling, bony tenderness and crepitus present.  Skin:    General: Skin is warm and dry.     Capillary Refill: Capillary refill takes less than 2 seconds.  Neurological:     Mental Status: She is alert and oriented to person, place, and time.  Psychiatric:        Mood and Affect: Mood normal.        Behavior: Behavior normal.    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   Vitals:   06/23/20 1100  BP: 122/76  Pulse: 86  Resp: 20  Temp: 97.8 F (36.6 C)  SpO2: 94%      Assessment & Plan:   Cynthia [name] comes in today with chief complaint of Bilateral leg pain (Sinus, scratchy throat. Ear pain/)   Diagnosis and orders addressed:  1. Acute pain of both knees Begin Celebrex for pain control. Discussed metatarsal pads could help with some pain relief.  Patient wanted steroid injections but knees were not  bad enough for injections- will try celebrex first. - DG Knee 1-2 Views Left - DG Knee 1-2 Views Right  2. Mild intermittent asthma without complication Begin using asthma medication as prescribed. Education on how to use spacer was provided. She was also encouraged to ask the pharmacist how to use. It is important to keep up with her daily medications to control asthma symptoms, especially during allergy symptoms.   - Spacer/Aero-Holding Dorise Bullion; 1 each by Does not apply route daily.  Dispense: 1 each; Refill: 0 - montelukast (SINGULAIR) 10 MG tablet; Take 1 tablet (10 mg total) by mouth at bedtime.  Dispense: 90 tablet; Refill: 3 - beclomethasone (QVAR) 80 MCG/ACT inhaler; Inhale 1 puff into the lungs 2 (two) times daily. Rinse mouth,  controller med  Dispense: 2 each; Refill: 5  3. Allergic rhinitis due to pollen, unspecified seasonality Begin taking fluticasone and cetirizine as prescribed.   - fluticasone (FLONASE) 50 MCG/ACT nasal spray; Place 2 sprays into both nostrils daily.  Dispense: 16 g; Refill: 6 - cetirizine (ZYRTEC) 10 MG tablet; Take 1 tablet (10 mg total) by mouth daily.  Dispense: 30 tablet; Refill: 11   Labs pending Health Maintenance reviewed Diet and exercise encouraged  Follow up plan: PRN  Dollene Primrose, RN, BSN, FNP-Student  Mary-Margaret Hassell Done, FNP

## 2020-06-28 ENCOUNTER — Telehealth: Payer: Self-pay

## 2020-06-28 NOTE — Telephone Encounter (Signed)
Pt aware FMLA ppw ready

## 2020-08-02 ENCOUNTER — Ambulatory Visit (INDEPENDENT_AMBULATORY_CARE_PROVIDER_SITE_OTHER): Payer: BC Managed Care – PPO | Admitting: Family Medicine

## 2020-08-02 ENCOUNTER — Other Ambulatory Visit: Payer: Self-pay

## 2020-08-02 ENCOUNTER — Encounter: Payer: Self-pay | Admitting: Family Medicine

## 2020-08-02 VITALS — BP 124/84 | HR 58 | Temp 98.0°F | Ht 62.0 in | Wt 217.8 lb

## 2020-08-02 DIAGNOSIS — M25561 Pain in right knee: Secondary | ICD-10-CM | POA: Diagnosis not present

## 2020-08-02 DIAGNOSIS — Z0000 Encounter for general adult medical examination without abnormal findings: Secondary | ICD-10-CM

## 2020-08-02 DIAGNOSIS — J4520 Mild intermittent asthma, uncomplicated: Secondary | ICD-10-CM | POA: Diagnosis not present

## 2020-08-02 DIAGNOSIS — Z0001 Encounter for general adult medical examination with abnormal findings: Secondary | ICD-10-CM | POA: Diagnosis not present

## 2020-08-02 DIAGNOSIS — E7800 Pure hypercholesterolemia, unspecified: Secondary | ICD-10-CM

## 2020-08-02 DIAGNOSIS — I10 Essential (primary) hypertension: Secondary | ICD-10-CM

## 2020-08-02 MED ORDER — AMLODIPINE BESYLATE 10 MG PO TABS: ORAL_TABLET | ORAL | 3 refills | Status: DC

## 2020-08-02 MED ORDER — METHYLPREDNISOLONE ACETATE 40 MG/ML IJ SUSP
40.0000 mg | Freq: Once | INTRAMUSCULAR | Status: AC
  Administered 2020-08-02: 40 mg via INTRA_ARTICULAR

## 2020-08-02 MED ORDER — HYDROCHLOROTHIAZIDE 25 MG PO TABS: ORAL_TABLET | ORAL | 3 refills | Status: DC

## 2020-08-02 MED ORDER — ATORVASTATIN CALCIUM 20 MG PO TABS: ORAL_TABLET | ORAL | 3 refills | Status: DC

## 2020-08-02 NOTE — Progress Notes (Signed)
Cynthia [name] is a 55 y.o. female presents to office today for annual physical exam examination.    Concerns today include: 1.  Hypertension with hyperlipidemia Patient is compliant with her medications.  No reports of chest pain, shortness of breath or edema  2.  Right knee pain Patient has been seen on a few occasions now for right knee pain.  She had corticosteroid injection done back in August and this was helpful but did not last.  She was seen again in March and prescribed Celebrex.  She did not find this to be helpful at all therefore she discontinued it.  At this point she is really wanting to see an orthopedist to discuss alternative therapies.  She is worked on weight loss with successful 14 pound reduction but is often on her feet.  Occupation: [employer] (on feet a lot), Marital status: married, Substance use: none Diet: Insufficient dietary fruit, vegetable and water, Exercise: No structured but is active at work Last eye exam: Up-to-date Last dental exam: Needs to schedule Last colonoscopy: Up-to-date Last mammogram: Needs to schedule Last pap smear: Up-to-date in 2021 and was normal Refills needed today: All Immunizations needed: Declines tetanus  There is no immunization history on file for this patient.   Past Medical History:  Diagnosis Date  . Allergy   . High cholesterol   . Hypertension   . Migraine   . Vertigo    Social History   Socioeconomic History  . Marital status: Married    Spouse name: Not on file  . Number of children: Not on file  . Years of education: Not on file  . Highest education level: Not on file  Occupational History  . Not on file  Tobacco Use  . Smoking status: Former Smoker    Packs/day: 0.50    Years: 10.00    Pack years: 5.00    Types: Cigarettes    Quit date: 06/04/2016    Years since quitting: 4.1  . Smokeless tobacco: Never Used  Vaping Use  . Vaping Use: Never used  Substance and Sexual Activity  . Alcohol use:  Yes    Comment: occ  . Drug use: No  . Sexual activity: Not Currently    Partners: Male    Comment: Married  Other Topics Concern  . Not on file  Social History Narrative   Lives at home w/ her husband   Left-handed   Caffeine: 2-3 sodas per day   Social Determinants of Health   Financial Resource Strain: Not on file  Food Insecurity: Not on file  Transportation Needs: Not on file  Physical Activity: Not on file  Stress: Not on file  Social Connections: Not on file  Intimate Partner Violence: Not on file   Past Surgical History:  Procedure Laterality Date  . ABDOMINAL HYSTERECTOMY    . BREAST EXCISIONAL BIOPSY    . BREAST SURGERY Left    lump removed   . CESAREAN SECTION     x2  . CYST REMOVAL LEG    . FRACTURE SURGERY Right    Arm  . TONSILLECTOMY     Family History  Problem Relation Age of Onset  . Heart failure Mother   . Hypertension Father   . Kidney disease Father   . Cancer Maternal Grandmother   . Cancer Maternal Aunt   . Colon cancer Neg Hx   . Esophageal cancer Neg Hx   . Rectal cancer Neg Hx   . Stomach cancer  Neg Hx     Current Outpatient Medications:  .  albuterol (VENTOLIN HFA) 108 (90 Base) MCG/ACT inhaler, Inhale 2 puffs into the lungs every 6 (six) hours as needed for wheezing or shortness of breath., Disp: 18 g, Rfl: 5 .  amLODipine (NORVASC) 10 MG tablet, TAKE 1 TABLET BY MOUTH DAILY, Disp: 30 tablet, Rfl: 0 .  atorvastatin (LIPITOR) 20 MG tablet, TAKE 1 TABLET(20 MG) BY MOUTH DAILY, Disp: 30 tablet, Rfl: 0 .  beclomethasone (QVAR) 80 MCG/ACT inhaler, Inhale 1 puff into the lungs 2 (two) times daily. Rinse mouth, controller med, Disp: 2 each, Rfl: 5 .  celecoxib (CELEBREX) 200 MG capsule, Take 1 capsule (200 mg total) by mouth 2 (two) times daily., Disp: 60 capsule, Rfl: 2 .  cetirizine (ZYRTEC) 10 MG tablet, Take 1 tablet (10 mg total) by mouth daily., Disp: 30 tablet, Rfl: 11 .  fluticasone (FLONASE) 50 MCG/ACT nasal spray, Place 2 sprays  into both nostrils daily., Disp: 16 g, Rfl: 6 .  hydrochlorothiazide (HYDRODIURIL) 25 MG tablet, TAKE 1 TABLET(25 MG) BY MOUTH DAILY, Disp: 30 tablet, Rfl: 0 .  hydrocortisone (ANUSOL-HC) 25 MG suppository, Place 1 suppository (25 mg total) rectally at bedtime. Use for 5 days, Disp: 5 suppository, Rfl: 0 .  ketorolac (ACULAR) 0.5 % ophthalmic solution, Place 1 drop into both eyes 4 (four) times daily., Disp: 10 mL, Rfl: 5 .  montelukast (SINGULAIR) 10 MG tablet, Take 1 tablet (10 mg total) by mouth at bedtime., Disp: 90 tablet, Rfl: 3 .  Spacer/Aero-Holding Chambers DEVI, 1 each by Does not apply route daily., Disp: 1 each, Rfl: 0  No Known Allergies   ROS: Review of Systems Pertinent items noted in HPI and remainder of comprehensive ROS otherwise negative.    Physical exam BP 124/84   Pulse (!) 58   Temp 98 F (36.7 C)   Ht $R'5\' 2"'NJ$  (1.575 m)   Wt 217 lb 12.8 oz (98.8 kg)   SpO2 97%   BMI 39.84 kg/m  General appearance: alert, cooperative, appears stated age and moderately obese Head: Normocephalic, without obvious abnormality, atraumatic Eyes: negative findings: lids and lashes normal, conjunctivae and sclerae normal, corneas clear and pupils equal, round, reactive to light and accomodation Ears: normal TM's and external ear canals both ears Nose: Nares normal. Septum midline. Mucosa normal. No drainage or sinus tenderness. Throat: lips, mucosa, and tongue normal; teeth and gums normal Neck: no adenopathy, no carotid bruit, supple, symmetrical, trachea midline and thyroid not enlarged, symmetric, no tenderness/mass/nodules Back: symmetric, no curvature. ROM normal. No CVA tenderness. Lungs: clear to auscultation bilaterally Heart: regular rate and rhythm, S1, S2 normal, no murmur, click, rub or gallop Abdomen: soft, non-tender; bowel sounds normal; no masses,  no organomegaly Extremities: extremities normal, atraumatic, no cyanosis or edema Pulses: 2+ and symmetric Skin: Skin  color, texture, turgor normal. No rashes or lesions Lymph nodes: Cervical, supraclavicular, and axillary nodes normal. Neurologic: Alert and oriented X 3, normal strength and tone. Normal symmetric reflexes. Normal coordination and gait Right knee: Full active range of motion.  She does have appreciable joint effusion.  No warmth, erythema.  No ligamentous laxity.  No tenderness palpation to the patella, patellar tendon, quad tendon.  JOINT INJECTION:  Patient denies allergy to antiseptics (including iodine) and anesthetics.  Patient denies h/o diabetes, frequent steroid use, use of blood thinners/ antiplatelets.  Last corticosteroid injection was administered in November 2021 by outside provider  Patient was given informed consent and a signed copy  has been placed in the chart. Appropriate time out was taken. Area prepped and draped in usual sterile fashion. Anatomic landmarks were identified and injection site was marked.  Ethyl chloride spray was used to numb the area and 1 cc of methylprednisolone 40 mg/ml plus  3 cc of 1% lidocaine without epinephrine was injected into the right knee using a(n) anterior lateral approach. The patient tolerated the procedure well and there were no immediate complications. Estimated blood loss is less than 1 cc.  Post procedure instructions were reviewed and handout outlining these instructions were provided to patient.   Assessment/ Plan: Lyda Jester here for annual physical exam.   Annual physical exam  Mild intermittent asthma without complication - Plan: CBC  Essential hypertension - Plan: CMP14+EGFR, amLODipine (NORVASC) 10 MG tablet, hydrochlorothiazide (HYDRODIURIL) 25 MG tablet  Morbid obesity (HCC) - Plan: CMP14+EGFR, Lipid panel, TSH, Bayer DCA Hb A1c Waived  Acute pain of right knee - Plan: Ambulatory referral to Orthopedic Surgery  Pure hypercholesterolemia - Plan: atorvastatin (LIPITOR) 20 MG tablet  Patient to schedule mammogram.   She is up-to-date on colonoscopy and Pap smear.  Declines tetanus shot  Asthma stable.  Continue current regimen  Blood pressure well controlled.  Refill sent.  She will come in for fasting labs  Has had some weight loss and is trying to modify diet.  Increase fruits and vegetables and water  Right knee pain likely secondary to degenerative changes.  Sounds meniscal in nature.  Referral to orthopedics placed since she has failed conservative therapies thus far.  Corticosteroid injection performed.  No immediate complications.  Home care instructions reviewed with patient.  Follow-up as needed  Atorvastatin renewed.  Follow-up for fasting labs  Counseled on healthy lifestyle choices, including diet (rich in fruits, vegetables and lean meats and low in salt and simple carbohydrates) and exercise (at least 30 minutes of moderate physical activity daily).  Patient to follow up in 1 year for annual exam or sooner if needed.  Nanako Stopher M. Lajuana Ripple, DO

## 2020-08-02 NOTE — Patient Instructions (Addendum)
Schedule mammogram.   Preventive Care 17-57 Years Old, Female Preventive care refers to lifestyle choices and visits with your health care provider that can promote health and wellness. This includes:  A yearly physical exam. This is also called an annual wellness visit.  Regular dental and eye exams.  Immunizations.  Screening for certain conditions.  Healthy lifestyle choices, such as: ? Eating a healthy diet. ? Getting regular exercise. ? Not using drugs or products that contain nicotine and tobacco. ? Limiting alcohol use. What can I expect for my preventive care visit? Physical exam Your health care provider will check your:  Height and weight. These may be used to calculate your BMI (body mass index). BMI is a measurement that tells if you are at a healthy weight.  Heart rate and blood pressure.  Body temperature.  Skin for abnormal spots. Counseling Your health care provider may ask you questions about your:  Past medical problems.  Family's medical history.  Alcohol, tobacco, and drug use.  Emotional well-being.  Home life and relationship well-being.  Sexual activity.  Diet, exercise, and sleep habits.  Work and work Statistician.  Access to firearms.  Method of birth control.  Menstrual cycle.  Pregnancy history. What immunizations do I need? Vaccines are usually given at various ages, according to a schedule. Your health care provider will recommend vaccines for you based on your age, medical history, and lifestyle or other factors, such as travel or where you work.   What tests do I need? Blood tests  Lipid and cholesterol levels. These may be checked every 5 years, or more often if you are over 35 years old.  Hepatitis C test.  Hepatitis B test. Screening  Lung cancer screening. You may have this screening every year starting at age 64 if you have a 30-pack-year history of smoking and currently smoke or have quit within the past 15  years.  Colorectal cancer screening. ? All adults should have this screening starting at age 5 and continuing until age 41. ? Your health care provider may recommend screening at age 22 if you are at increased risk. ? You will have tests every 1-10 years, depending on your results and the type of screening test.  Diabetes screening. ? This is done by checking your blood sugar (glucose) after you have not eaten for a while (fasting). ? You may have this done every 1-3 years.  Mammogram. ? This may be done every 1-2 years. ? Talk with your health care provider about when you should start having regular mammograms. This may depend on whether you have a family history of breast cancer.  BRCA-related cancer screening. This may be done if you have a family history of breast, ovarian, tubal, or peritoneal cancers.  Pelvic exam and Pap test. ? This may be done every 3 years starting at age 4. ? Starting at age 48, this may be done every 5 years if you have a Pap test in combination with an HPV test. Other tests  STD (sexually transmitted disease) testing, if you are at risk.  Bone density scan. This is done to screen for osteoporosis. You may have this scan if you are at high risk for osteoporosis. Talk with your health care provider about your test results, treatment options, and if necessary, the need for more tests. Follow these instructions at home: Eating and drinking  Eat a diet that includes fresh fruits and vegetables, whole grains, lean protein, and low-fat dairy products.  Take  vitamin and mineral supplements as recommended by your health care provider.  Do not drink alcohol if: ? Your health care provider tells you not to drink. ? You are pregnant, may be pregnant, or are planning to become pregnant.  If you drink alcohol: ? Limit how much you have to 0-1 drink a day. ? Be aware of how much alcohol is in your drink. In the [place] one drink equals one 12 oz bottle of beer  (355 mL), one 5 oz glass of wine (148 mL), or one 1 oz glass of hard liquor (44 mL).   Lifestyle  Take daily care of your teeth and gums. Brush your teeth every morning and night with fluoride toothpaste. Floss one time each day.  Stay active. Exercise for at least 30 minutes 5 or more days each week.  Do not use any products that contain nicotine or tobacco, such as cigarettes, e-cigarettes, and chewing tobacco. If you need help quitting, ask your health care provider.  Do not use drugs.  If you are sexually active, practice safe sex. Use a condom or other form of protection to prevent STIs (sexually transmitted infections).  If you do not wish to become pregnant, use a form of birth control. If you plan to become pregnant, see your health care provider for a prepregnancy visit.  If told by your health care provider, take low-dose aspirin daily starting at age 61.  Find healthy ways to cope with stress, such as: ? Meditation, yoga, or listening to music. ? Journaling. ? Talking to a trusted person. ? Spending time with friends and family. Safety  Always wear your seat belt while driving or riding in a vehicle.  Do not drive: ? If you have been drinking alcohol. Do not ride with someone who has been drinking. ? When you are tired or distracted. ? While texting.  Wear a helmet and other protective equipment during sports activities.  If you have firearms in your house, make sure you follow all gun safety procedures. What's next?  Visit your health care provider once a year for an annual wellness visit.  Ask your health care provider how often you should have your eyes and teeth checked.  Stay up to date on all vaccines. This information is not intended to replace advice given to you by your health care provider. Make sure you discuss any questions you have with your health care provider. Document Revised: 12/29/2019 Document Reviewed: 12/05/2017 Elsevier Patient Education   2021 [employer].

## 2020-09-02 ENCOUNTER — Ambulatory Visit (INDEPENDENT_AMBULATORY_CARE_PROVIDER_SITE_OTHER): Payer: BC Managed Care – PPO | Admitting: Family Medicine

## 2020-09-02 ENCOUNTER — Encounter: Payer: Self-pay | Admitting: Family Medicine

## 2020-09-02 DIAGNOSIS — J019 Acute sinusitis, unspecified: Secondary | ICD-10-CM | POA: Diagnosis not present

## 2020-09-02 MED ORDER — PREDNISONE 10 MG (21) PO TBPK: ORAL_TABLET | ORAL | 0 refills | Status: DC

## 2020-09-02 MED ORDER — AMOXICILLIN-POT CLAVULANATE 875-125 MG PO TABS: 1.0000 | ORAL_TABLET | Freq: Two times a day (BID) | ORAL | 0 refills | Status: AC

## 2020-09-02 NOTE — Progress Notes (Signed)
Virtual Visit via Telephone Note  I connected with Cynthia [name] on 09/02/20 at 9:25 AM by telephone and verified that I am speaking with the correct person using two identifiers. Cynthia [name] is currently located at home and nobody is currently with her during this visit. The provider, Loman Brooklyn, FNP is located in their office at time of visit.  I discussed the limitations, risks, security and privacy concerns of performing an evaluation and management service by telephone and the availability of in person appointments. I also discussed with the patient that there may be a patient responsible charge related to this service. The patient expressed understanding and agreed to proceed.  Subjective: PCP: [name], DO  Chief Complaint  Patient presents with  . URI   Patient complains of cough, sneezing, ear pain/pressure and facial pain/pressure. Onset of symptoms was a few days ago, gradually worsening since that time. She is drinking plenty of fluids. Evaluation to date: none. Treatment to date: "OTC stuff". She has a history of asthma. She does not smoke.    ROS: Per HPI  Current Outpatient Medications:  .  albuterol (VENTOLIN HFA) 108 (90 Base) MCG/ACT inhaler, Inhale 2 puffs into the lungs every 6 (six) hours as needed for wheezing or shortness of breath., Disp: 18 g, Rfl: 5 .  amLODipine (NORVASC) 10 MG tablet, TAKE 1 TABLET BY MOUTH DAILY, Disp: 90 tablet, Rfl: 3 .  atorvastatin (LIPITOR) 20 MG tablet, TAKE 1 TABLET(20 MG) BY MOUTH DAILY, Disp: 90 tablet, Rfl: 3 .  beclomethasone (QVAR) 80 MCG/ACT inhaler, Inhale 1 puff into the lungs 2 (two) times daily. Rinse mouth, controller med, Disp: 2 each, Rfl: 5 .  cetirizine (ZYRTEC) 10 MG tablet, Take 1 tablet (10 mg total) by mouth daily., Disp: 30 tablet, Rfl: 11 .  fluticasone (FLONASE) 50 MCG/ACT nasal spray, Place 2 sprays into both nostrils daily., Disp: 16 g, Rfl: 6 .  hydrochlorothiazide (HYDRODIURIL) 25  MG tablet, TAKE 1 TABLET(25 MG) BY MOUTH DAILY, Disp: 90 tablet, Rfl: 3 .  hydrocortisone (ANUSOL-HC) 25 MG suppository, Place 1 suppository (25 mg total) rectally at bedtime. Use for 5 days, Disp: 5 suppository, Rfl: 0 .  ketorolac (ACULAR) 0.5 % ophthalmic solution, Place 1 drop into both eyes 4 (four) times daily., Disp: 10 mL, Rfl: 5 .  montelukast (SINGULAIR) 10 MG tablet, Take 1 tablet (10 mg total) by mouth at bedtime., Disp: 90 tablet, Rfl: 3 .  Spacer/Aero-Holding Chambers DEVI, 1 each by Does not apply route daily., Disp: 1 each, Rfl: 0  No Known Allergies Past Medical History:  Diagnosis Date  . Allergy   . High cholesterol   . Hypertension   . Migraine   . Vertigo     Observations/Objective: A&O  No respiratory distress or wheezing audible over the phone Mood, judgement, and thought processes all WNL  Assessment and Plan: 1. Acute non-recurrent sinusitis, unspecified location Discussed symptom management.  - amoxicillin-clavulanate (AUGMENTIN) 875-125 MG tablet; Take 1 tablet by mouth 2 (two) times daily for 7 days.  Dispense: 14 tablet; Refill: 0 - predniSONE (STERAPRED UNI-PAK 21 TAB) 10 MG (21) TBPK tablet; As directed x 6 days  Dispense: 21 tablet; Refill: 0   Follow Up Instructions:  I discussed the assessment and treatment plan with the patient. The patient was provided an opportunity to ask questions and all were answered. The patient agreed with the plan and demonstrated an understanding of the instructions.   The patient was  advised to call back or seek an in-person evaluation if the symptoms worsen or if the condition fails to improve as anticipated.  The above assessment and management plan was discussed with the patient. The patient verbalized understanding of and has agreed to the management plan. Patient is aware to call the clinic if symptoms persist or worsen. Patient is aware when to return to the clinic for a follow-up visit. Patient educated on when it  is appropriate to go to the emergency department.   Time call ended: 9:31 AM  I provided 6 minutes of non-face-to-face time during this encounter.  Hendricks Limes, MSN, APRN, FNP-C [facility] 09/02/20

## 2020-09-13 ENCOUNTER — Other Ambulatory Visit: Payer: Self-pay | Admitting: Family Medicine

## 2020-09-13 DIAGNOSIS — Z1231 Encounter for screening mammogram for malignant neoplasm of breast: Secondary | ICD-10-CM

## 2020-12-27 ENCOUNTER — Ambulatory Visit (INDEPENDENT_AMBULATORY_CARE_PROVIDER_SITE_OTHER): Payer: BC Managed Care – PPO | Admitting: Family Medicine

## 2020-12-27 DIAGNOSIS — R6889 Other general symptoms and signs: Secondary | ICD-10-CM

## 2020-12-27 DIAGNOSIS — R197 Diarrhea, unspecified: Secondary | ICD-10-CM

## 2020-12-27 NOTE — Progress Notes (Signed)
Telephone visit  Subjective: CC: Flu like symptoms PCP: [name], DO Cynthia [name] is a 55 y.o. female calls for telephone consult today. Patient provides verbal consent for consult held via phone.  Due to COVID-19 pandemic this visit was conducted virtually. This visit type was conducted due to national recommendations for restrictions regarding the COVID-19 Pandemic (e.g. social distancing, sheltering in place) in an effort to limit this patient's exposure and mitigate transmission in our community. All issues noted in this document were discussed and addressed.  A physical exam was not performed with this format.   Location of patient: home Location of provider: [facility] Others present for call: none  1. URI She reports decreased appetite, generalized sensation of weakness.  No dysuria.  She reports diarrhea this am, nonbloody.  No nausea, vomiting.  Subjective fever.  No known sick contact.  Has not been COVID tested nor does she wish to be at this time.  Has home analgesics which she will take.  She is asking for a note to excuse her from work for the next several days.  She is already missed 1 day.   ROS: Per HPI  No Known Allergies Past Medical History:  Diagnosis Date   Allergy    High cholesterol    Hypertension    Migraine    Vertigo     Current Outpatient Medications:    albuterol (VENTOLIN HFA) 108 (90 Base) MCG/ACT inhaler, Inhale 2 puffs into the lungs every 6 (six) hours as needed for wheezing or shortness of breath., Disp: 18 g, Rfl: 5   amLODipine (NORVASC) 10 MG tablet, TAKE 1 TABLET BY MOUTH DAILY, Disp: 90 tablet, Rfl: 3   atorvastatin (LIPITOR) 20 MG tablet, TAKE 1 TABLET(20 MG) BY MOUTH DAILY, Disp: 90 tablet, Rfl: 3   beclomethasone (QVAR) 80 MCG/ACT inhaler, Inhale 1 puff into the lungs 2 (two) times daily. Rinse mouth, controller med, Disp: 2 each, Rfl: 5   cetirizine (ZYRTEC) 10 MG tablet, Take 1 tablet (10 mg total) by mouth daily., Disp:  30 tablet, Rfl: 11   fluticasone (FLONASE) 50 MCG/ACT nasal spray, Place 2 sprays into both nostrils daily., Disp: 16 g, Rfl: 6   hydrochlorothiazide (HYDRODIURIL) 25 MG tablet, TAKE 1 TABLET(25 MG) BY MOUTH DAILY, Disp: 90 tablet, Rfl: 3   hydrocortisone (ANUSOL-HC) 25 MG suppository, Place 1 suppository (25 mg total) rectally at bedtime. Use for 5 days, Disp: 5 suppository, Rfl: 0   ketorolac (ACULAR) 0.5 % ophthalmic solution, Place 1 drop into both eyes 4 (four) times daily., Disp: 10 mL, Rfl: 5   montelukast (SINGULAIR) 10 MG tablet, Take 1 tablet (10 mg total) by mouth at bedtime., Disp: 90 tablet, Rfl: 3   Spacer/Aero-Holding Chambers DEVI, 1 each by Does not apply route daily., Disp: 1 each, Rfl: 0  Assessment/ Plan: 55 y.o. female   Flu-like symptoms - Plan: Novel Coronavirus, NAA ([facility]), Veritor Flu A/B Waived  Diarrhea, unspecified type  I have highly recommended COVID and flu testing if she is feeling poorly.  Future orders have been placed.  It does not sound like she will be coming today but I placed a note upfront for her husband to retrieve her work.  Advised to hydrate well.  We discussed red flag signs and symptoms and reasons for emergent evaluation.  She voiced good understanding  Start time: 1:39pm End time: 1:44pm  Total time spent on patient care (including telephone call/ virtual visit): 5 minutes  [name], DO  [place] [phone]

## 2020-12-27 NOTE — Patient Instructions (Signed)
Diarrhea, Adult Diarrhea is when you pass loose and watery poop (stool) often. Diarrhea can make you feel weak and cause you to lose water in your body (get dehydrated). Losing water in your body can cause you to: Feel tired and thirsty. Have a dry mouth. Go pee (urinate) less often. Diarrhea often lasts 2-3 days. However, it can last longer if it is a sign of something more serious. It is important to treat your diarrhea as told by your doctor. Follow these instructions at home: Eating and drinking   Follow these instructions as told by your doctor: Take an ORS (oral rehydration solution). This is a drink that helps you replace fluids and minerals your body lost. It is sold at pharmacies and stores. Drink plenty of fluids, such as: Water. Ice chips. Diluted fruit juice. Low-calorie sports drinks. Milk, if you want. Avoid drinking fluids that have a lot of sugar or caffeine in them. Eat bland, easy-to-digest foods in small amounts as you are able. These foods include: Bananas. Applesauce. Rice. Low-fat (lean) meats. Toast. Crackers. Avoid alcohol. Avoid spicy or fatty foods.  Medicines Take over-the-counter and prescription medicines only as told by your doctor. If you were prescribed an antibiotic medicine, take it as told by your doctor. Do not stop using the antibiotic even if you start to feel better. General instructions  Wash your hands often using soap and water. If soap and water are not available, use a hand sanitizer. Others in your home should wash their hands as well. Hands should be washed: After using the toilet or changing a diaper. Before preparing, cooking, or serving food. While caring for a sick person. While visiting someone in a hospital. Drink enough fluid to keep your pee (urine) pale yellow. Rest at home while you get better. Watch your condition for any changes. Take a warm bath to help with any burning or pain from having diarrhea. Keep all  follow-up visits as told by your doctor. This is important. Contact a doctor if: You have a fever. Your diarrhea gets worse. You have new symptoms. You cannot keep fluids down. You feel light-headed or dizzy. You have a headache. You have muscle cramps. Get help right away if: You have chest pain. You feel very weak or you pass out (faint). You have bloody or black poop or poop that looks like tar. You have very bad pain, cramping, or bloating in your belly (abdomen). You have trouble breathing or you are breathing very quickly. Your heart is beating very quickly. Your skin feels cold and clammy. You feel confused. You have signs of losing too much water in your body, such as: Dark pee, very little pee, or no pee. Cracked lips. Dry mouth. Sunken eyes. Sleepiness. Weakness. Summary Diarrhea is when you pass loose and watery poop (stool) often. Diarrhea can make you feel weak and cause you to lose water in your body (get dehydrated). Take an ORS (oral rehydration solution). This is a drink that is sold at pharmacies and stores. Eat bland, easy-to-digest foods in small amounts as you are able. Contact a doctor if your condition gets worse. Get help right away if you have signs that you have lost too much water in your body. This information is not intended to replace advice given to you by your health care provider. Make sure you discuss any questions you have with your health care provider. Document Revised: 08/30/2017 Document Reviewed: 08/30/2017 Elsevier Patient Education  2022 Elsevier Inc.  

## 2020-12-28 ENCOUNTER — Other Ambulatory Visit: Payer: BC Managed Care – PPO

## 2020-12-28 LAB — VERITOR FLU A/B WAIVED
Influenza A: NEGATIVE
Influenza B: NEGATIVE

## 2020-12-28 NOTE — Addendum Note (Signed)
Addended by: Collier Bullock on: 12/28/2020 10:37 AM   Modules accepted: Orders

## 2020-12-29 ENCOUNTER — Telehealth: Payer: Self-pay | Admitting: Family Medicine

## 2020-12-29 LAB — SARS-COV-2, NAA 2 DAY TAT

## 2020-12-29 LAB — NOVEL CORONAVIRUS, NAA: SARS-CoV-2, NAA: NOT DETECTED

## 2020-12-29 NOTE — Telephone Encounter (Signed)
Spoke with patient she is very sick and just getting worse she came up here and was COVID and flu tested yesterday flu negative COVID not back yet. Husband aware he is going to pick up a at home COVID test and call us back with results. fyi

## 2020-12-29 NOTE — Telephone Encounter (Signed)
Pt called stating that she tested negative for COVID with home test. Needs someone to call her in some medicine ASAP for her symptoms.  Had televisit on 12/27/20. Please call patient ASAP

## 2020-12-29 NOTE — Telephone Encounter (Signed)
I have reviewed the note from her visit with Dr. Darnell Level. I seems like her symptoms are viral in nature. She can take imodium over the counter for diarrhea. She needs to make sure to stay well hydrated. I can send in zofran if she is having nausea or vomiting. If her symptoms worse or do not improve, she needs to be seen in person for further evaluation. If she has any concerns for dehydration, she needs to be seen in the ED as she may need fluids.

## 2020-12-29 NOTE — Telephone Encounter (Signed)
I spoke to pt and advised her of provider feedback. Pt states she hasn't ate anything in over 5 days and she is starting to be dizzy and lightheaded like she may pass out. Advised pt at this point she should go to the ED for evaluation and pt voiced understanding.

## 2020-12-29 NOTE — Telephone Encounter (Signed)
Covering PCP- please advise  

## 2021-01-03 ENCOUNTER — Telehealth: Payer: Self-pay

## 2021-01-03 ENCOUNTER — Ambulatory Visit (INDEPENDENT_AMBULATORY_CARE_PROVIDER_SITE_OTHER): Payer: BC Managed Care – PPO | Admitting: Family Medicine

## 2021-01-03 ENCOUNTER — Encounter: Payer: Self-pay | Admitting: Family Medicine

## 2021-01-03 ENCOUNTER — Ambulatory Visit: Payer: BC Managed Care – PPO

## 2021-01-03 VITALS — BP 104/69 | HR 98 | Temp 97.9°F | Ht 62.0 in | Wt 217.0 lb

## 2021-01-03 DIAGNOSIS — J189 Pneumonia, unspecified organism: Secondary | ICD-10-CM

## 2021-01-03 DIAGNOSIS — E860 Dehydration: Secondary | ICD-10-CM | POA: Diagnosis not present

## 2021-01-03 DIAGNOSIS — Z09 Encounter for follow-up examination after completed treatment for conditions other than malignant neoplasm: Secondary | ICD-10-CM

## 2021-01-03 DIAGNOSIS — B179 Acute viral hepatitis, unspecified: Secondary | ICD-10-CM

## 2021-01-03 DIAGNOSIS — J410 Simple chronic bronchitis: Secondary | ICD-10-CM

## 2021-01-03 DIAGNOSIS — R531 Weakness: Secondary | ICD-10-CM

## 2021-01-03 MED ORDER — ALBUTEROL SULFATE HFA 108 (90 BASE) MCG/ACT IN AERS: 2.0000 | INHALATION_SPRAY | Freq: Four times a day (QID) | RESPIRATORY_TRACT | 0 refills | Status: DC | PRN

## 2021-01-03 MED ORDER — SPIRIVA RESPIMAT 2.5 MCG/ACT IN AERS
2.0000 | INHALATION_SPRAY | Freq: Every day | RESPIRATORY_TRACT | 6 refills | Status: DC
Start: 2021-01-03 — End: 2021-05-09

## 2021-01-03 NOTE — Telephone Encounter (Signed)
Transition Care Management Unsuccessful Follow-up Telephone Call  Date of discharge and from where:  [facility] / 01/01/2021  Attempts:  1st Attempt  Reason for unsuccessful TCM follow-up call:  Left voice message  Quinn Plowman RN,BSN,CCM RN Case Manager [place].   [phone]

## 2021-01-03 NOTE — Telephone Encounter (Signed)
This encounter was created in error - please disregard.

## 2021-01-03 NOTE — Progress Notes (Signed)
Subjective:  Patient ID: Cynthia [name], female    DOB: 1965/11/28, 55 y.o.   MRN: [id number]  Patient Care Team: [name], DO as PCP - General (Family Medicine) Izora Gala, MD as Consulting Physician (Otolaryngology)   Chief Complaint:  [facility]   HPI: Cynthia [name] is a 55 y.o. female presenting on 01/03/2021 for Hospitalization Follow-up (Discharge Sunday 01/01/2021 [facility])   Pt presents today for follow up after recent hospital admission.  The patient was admitted to [facility] on 12/29/2020 and discharged on 01/01/2021 with a primary diagnosis of CAP, acute hepatitis, UTI, and generalized weakness. Through chart review and discussion with the patient I have determined that management of their condition is of high complexity. She was negative for COVID-19. CXR revealed right lower lobe basilar pneumonia. Pt received IV rocephin, Zithromax, and steroids during admission. She was sent home on Zithromax orally and has 3 dosed left Labs at time of discharge revealed potassium 3.3, calcium 8.2, AST 124, ALT 132, Hgb 9.9, Hct 29.4.  She states she is gradually improving since being home. States she feels weak and still has some shortness of breath with exertion. No resting shortness of breath. States she does have a cough and wheezing at times. Upon review of EHR pt has a prior diagnosis of chronic bronchitis and was prescribed several inhalers in the past, she is not taking any of them at this time. She quit smoking over 4 years ago. States she has daily exertional shortness of breath and significant respiratory illnesses at least twice per year during season changes.     Relevant past medical, surgical, family, and social history reviewed and updated as indicated.  Allergies and medications reviewed and updated. Data reviewed: Chart in Epic.   Past Medical History:  Diagnosis Date   Allergy     High cholesterol    Hypertension    Migraine    Vertigo     Past Surgical History:  Procedure Laterality Date   ABDOMINAL HYSTERECTOMY     BREAST EXCISIONAL BIOPSY     BREAST SURGERY Left    lump removed    CESAREAN SECTION     x2   CYST REMOVAL LEG     FRACTURE SURGERY Right    Arm   TONSILLECTOMY      Social History   Socioeconomic History   Marital status: Married    Spouse name: Not on file   Number of children: Not on file   Years of education: Not on file   Highest education level: Not on file  Occupational History   Not on file  Tobacco Use   Smoking status: Former    Packs/day: 0.50    Years: 10.00    Pack years: 5.00    Types: Cigarettes    Quit date: 06/04/2016    Years since quitting: 4.5   Smokeless tobacco: Never  Vaping Use   Vaping Use: Never used  Substance and Sexual Activity   Alcohol use: Yes    Comment: occ   Drug use: No   Sexual activity: Not Currently    Partners: Male    Comment: Married  Other Topics Concern   Not on file  Social History Narrative   Lives at home w/ her husband   Left-handed   Caffeine: 2-3 sodas per day   Social Determinants of Health   Financial Resource Strain: Not on file  Food Insecurity: Not on  file  Transportation Needs: Not on file  Physical Activity: Not on file  Stress: Not on file  Social Connections: Not on file  Intimate Partner Violence: Not on file    Outpatient Encounter Medications as of 01/03/2021  Medication Sig   albuterol (VENTOLIN HFA) 108 (90 Base) MCG/ACT inhaler Inhale 2 puffs into the lungs every 6 (six) hours as needed for wheezing or shortness of breath.   amLODipine (NORVASC) 10 MG tablet TAKE 1 TABLET BY MOUTH DAILY   atorvastatin (LIPITOR) 20 MG tablet TAKE 1 TABLET(20 MG) BY MOUTH DAILY   hydrochlorothiazide (HYDRODIURIL) 25 MG tablet TAKE 1 TABLET(25 MG) BY MOUTH DAILY   Tiotropium Bromide Monohydrate (SPIRIVA RESPIMAT) 2.5 MCG/ACT AERS Inhale 2 puffs into the lungs  daily.   azithromycin (ZITHROMAX) 500 MG tablet Take 500 mg by mouth daily.   beclomethasone (QVAR) 80 MCG/ACT inhaler Inhale 1 puff into the lungs 2 (two) times daily. Rinse mouth, controller med   cetirizine (ZYRTEC) 10 MG tablet Take 1 tablet (10 mg total) by mouth daily. (Patient not taking: Reported on 01/03/2021)   fluticasone (FLONASE) 50 MCG/ACT nasal spray Place 2 sprays into both nostrils daily. (Patient not taking: Reported on 01/03/2021)   hydrocortisone (ANUSOL-HC) 25 MG suppository Place 1 suppository (25 mg total) rectally at bedtime. Use for 5 days (Patient not taking: Reported on 01/03/2021)   ketorolac (ACULAR) 0.5 % ophthalmic solution Place 1 drop into both eyes 4 (four) times daily. (Patient not taking: Reported on 01/03/2021)   montelukast (SINGULAIR) 10 MG tablet Take 1 tablet (10 mg total) by mouth at bedtime. (Patient not taking: Reported on 01/03/2021)   Spacer/Aero-Holding Josiah Lobo DEVI 1 each by Does not apply route daily. (Patient not taking: Reported on 01/03/2021)   [DISCONTINUED] albuterol (VENTOLIN HFA) 108 (90 Base) MCG/ACT inhaler Inhale 2 puffs into the lungs every 6 (six) hours as needed for wheezing or shortness of breath. (Patient not taking: Reported on 01/03/2021)   No facility-administered encounter medications on file as of 01/03/2021.    No Known Allergies  Review of Systems  Constitutional:  Positive for activity change, appetite change and fatigue. Negative for chills, diaphoresis, fever and unexpected weight change.  HENT: Negative.  Negative for congestion.   Eyes: Negative.   Respiratory:  Positive for cough, shortness of breath and wheezing. Negative for apnea, choking, chest tightness and stridor.   Cardiovascular:  Negative for chest pain, palpitations and leg swelling.  Gastrointestinal:  Negative for abdominal pain, blood in stool, constipation, diarrhea, nausea and vomiting.  Endocrine: Negative.   Genitourinary:  Negative for decreased urine  volume, difficulty urinating, dysuria, frequency and urgency.  Musculoskeletal:  Negative for arthralgias, gait problem and myalgias.  Skin: Negative.   Allergic/Immunologic: Negative.   Neurological:  Positive for weakness (generalized). Negative for dizziness, tremors, seizures, syncope, facial asymmetry, speech difficulty, light-headedness, numbness and headaches.  Hematological: Negative.   Psychiatric/Behavioral:  Negative for confusion, hallucinations, sleep disturbance and suicidal ideas.   All other systems reviewed and are negative.      Objective:  BP 104/69   Pulse 98   Temp 97.9 F (36.6 C)   Ht '5\' 2"'  (1.575 m)   Wt 217 lb (98.4 kg)   SpO2 95%   BMI 39.69 kg/m    Wt Readings from Last 3 Encounters:  01/03/21 217 lb (98.4 kg)  08/02/20 217 lb 12.8 oz (98.8 kg)  06/23/20 220 lb (99.8 kg)    Physical Exam Vitals and nursing note reviewed.  Constitutional:  General: She is not in acute distress.    Appearance: Normal appearance. She is well-developed and well-groomed. She is obese. She is not ill-appearing, toxic-appearing or diaphoretic.  HENT:     Head: Normocephalic and atraumatic.     Jaw: There is normal jaw occlusion.     Right Ear: Hearing normal.     Left Ear: Hearing normal.     Nose: Nose normal.     Mouth/Throat:     Lips: Pink.     Mouth: Mucous membranes are moist.     Pharynx: Oropharynx is clear. Uvula midline.  Eyes:     General: Lids are normal.     Extraocular Movements: Extraocular movements intact.     Conjunctiva/sclera: Conjunctivae normal.     Pupils: Pupils are equal, round, and reactive to light.  Neck:     Thyroid: No thyroid mass, thyromegaly or thyroid tenderness.     Vascular: No carotid bruit or JVD.     Trachea: Trachea and phonation normal.  Cardiovascular:     Rate and Rhythm: Normal rate and regular rhythm.     Chest Wall: PMI is not displaced.     Pulses: Normal pulses.     Heart sounds: Normal heart sounds. No  murmur heard.   No friction rub. No gallop.  Pulmonary:     Effort: Pulmonary effort is normal. No respiratory distress.     Breath sounds: No stridor. Wheezing (minimal, bases) present. No rhonchi or rales.  Chest:     Chest wall: No tenderness.  Abdominal:     General: Abdomen is protuberant. Bowel sounds are normal. There is no distension or abdominal bruit.     Palpations: Abdomen is soft. There is no hepatomegaly or splenomegaly.     Tenderness: There is no abdominal tenderness. There is no right CVA tenderness or left CVA tenderness.     Hernia: No hernia is present.  Musculoskeletal:        General: Normal range of motion.     Cervical back: Normal range of motion and neck supple.     Right lower leg: No edema.     Left lower leg: No edema.  Lymphadenopathy:     Cervical: No cervical adenopathy.  Skin:    General: Skin is warm and dry.     Capillary Refill: Capillary refill takes less than 2 seconds.     Coloration: Skin is not cyanotic, jaundiced or pale.     Findings: No rash.  Neurological:     General: No focal deficit present.     Mental Status: She is alert and oriented to person, place, and time.     Cranial Nerves: Cranial nerves are intact.     Sensory: Sensation is intact.     Motor: Motor function is intact. No weakness.     Coordination: Coordination is intact.     Gait: Gait is intact. Gait normal.     Deep Tendon Reflexes: Reflexes are normal and symmetric.  Psychiatric:        Attention and Perception: Attention and perception normal.        Mood and Affect: Mood and affect normal.        Speech: Speech normal.        Behavior: Behavior normal. Behavior is cooperative.        Thought Content: Thought content normal.        Cognition and Memory: Cognition and memory normal.        Judgment: Judgment  normal.    Results for orders placed or performed in visit on 12/27/20  Novel Coronavirus, NAA ([facility])   Specimen: Nasopharyngeal(NP) swabs in vial  transport medium  Result Value Ref Range   SARS-CoV-2, NAA Not Detected Not Detected  SARS-COV-2, NAA 2 DAY TAT  Result Value Ref Range   SARS-CoV-2, NAA 2 DAY TAT Performed   Veritor Flu A/B Waived  Result Value Ref Range   Influenza A Negative Negative   Influenza B Negative Negative       Pertinent labs & imaging results that were available during my care of the patient were reviewed by me and considered in my medical decision making.  Assessment & Plan:  Carrera was seen today for hospitalization follow-up.  Diagnoses and all orders for this visit:  Hospital discharge follow-up Community acquired pneumonia of right lower lobe of lung Discharged 01/01/2021. Improved significantly since home per pt and spouse reports. Will repeat CXR at 4-6 week follow up. Complete course of antibiotics. Will repeat labs.  -     DG Chest 2 View; Future -     CBC with Differential/Platelet -     CMP14+EGFR  Acute hepatitis Will obtain hepatitis panel today as LFTs were elevated during hospital admission. Will refer to GI if warranted. Avoid alcohol and tylenol.  -     CMP14+EGFR -     HepB+HepC+HIV Panel  Dehydration Has been drinking well at home. Voiding normally. Will repeat labs today.  -     CBC with Differential/Platelet -     CMP14+EGFR  Generalized weakness Gradually improving. Will repeat labs today.  -     CBC with Differential/Platelet -     CMP14+EGFR  Simple chronic bronchitis (HCC) Prior diagnosis in EHR. Has not been using inhalers as prescribed. Has daily symptoms. Will initiate daily Spiriva and PRN Albuterol. Pt aware to report any new or worsening symptoms.  -     Tiotropium Bromide Monohydrate (SPIRIVA RESPIMAT) 2.5 MCG/ACT AERS; Inhale 2 puffs into the lungs daily. -     albuterol (VENTOLIN HFA) 108 (90 Base) MCG/ACT inhaler; Inhale 2 puffs into the lungs every 6 (six) hours as needed for wheezing or shortness of breath.    Continue all other maintenance  medications.  Follow up plan: Return in about 6 weeks (around 02/14/2021), or if symptoms worsen or fail to improve.   Continue healthy lifestyle choices, including diet (rich in fruits, vegetables, and lean proteins, and low in salt and simple carbohydrates) and exercise (at least 30 minutes of moderate physical activity daily).  Educational handout given for CAP, chronic bronchitis.   The above assessment and management plan was discussed with the patient. The patient verbalized understanding of and has agreed to the management plan. Patient is aware to call the clinic if they develop any new symptoms or if symptoms persist or worsen. Patient is aware when to return to the clinic for a follow-up visit. Patient educated on when it is appropriate to go to the emergency department.   Monia Pouch, FNP-C [facility] [phone]

## 2021-01-04 ENCOUNTER — Telehealth: Payer: Self-pay

## 2021-01-04 ENCOUNTER — Telehealth: Payer: Self-pay | Admitting: Family Medicine

## 2021-01-04 NOTE — Telephone Encounter (Signed)
NA

## 2021-01-04 NOTE — Telephone Encounter (Signed)
Transition Care Management Follow-up Telephone Call Date of discharge and from where: 01/01/2021/ [facility] How have you been since you were released from the hospital? " Finally coming together and finally wanting food" Any questions or concerns? Yes, " Wants to know how she got hepatitis"  Items Reviewed: Did the pt receive and understand the discharge instructions provided? Yes  Medications obtained and verified? Yes  Other? No  Any new allergies since your discharge? No  Dietary orders reviewed? Yes Do you have support at home? Yes   Home Care and Equipment/Supplies: Were home health services ordered? no If so, what is the name of the agency? N/A  Has the agency set up a time to come to the patient's home? not applicable Were any new equipment or medical supplies ordered?  No What is the name of the medical supply agency? N/A Were you able to get the supplies/equipment? not applicable Do you have any questions related to the use of the equipment or supplies? No  Functional Questionnaire: (I = Independent and D = Dependent) ADLs: I  Bathing/Dressing- I  Meal Prep- I I Eating- i  Maintaining continence- i  Transferring/Ambulation- i  Managing Meds- I  Follow up appointments reviewed:  PCP Hospital f/u appt confirmed? Yes  Scheduled to see February 02, 2021@ 3:40pm  Specialist Hospital f/u appt confirmed? No   Are transportation arrangements needed? Yes  If their condition worsens, is the pt aware to call PCP or go to the Emergency Dept.? Yes Was the patient provided with contact information for the PCP's office or ED? Yes Was to pt encouraged to call back with questions or concerns? Yes

## 2021-01-11 LAB — CBC WITH DIFFERENTIAL/PLATELET
Basophils Absolute: 0.1 10*3/uL (ref 0.0–0.2)
Basos: 1 %
EOS (ABSOLUTE): 0.4 10*3/uL (ref 0.0–0.4)
Eos: 4 %
Hematocrit: 37.2 % (ref 34.0–46.6)
Hemoglobin: 11.9 g/dL (ref 11.1–15.9)
Immature Grans (Abs): 0.5 10*3/uL — ABNORMAL HIGH (ref 0.0–0.1)
Immature Granulocytes: 5 %
Lymphocytes Absolute: 2.3 10*3/uL (ref 0.7–3.1)
Lymphs: 23 %
MCH: 25.8 pg — ABNORMAL LOW (ref 26.6–33.0)
MCHC: 32 g/dL (ref 31.5–35.7)
MCV: 81 fL (ref 79–97)
Monocytes Absolute: 0.5 10*3/uL (ref 0.1–0.9)
Monocytes: 5 %
Neutrophils Absolute: 6.2 10*3/uL (ref 1.4–7.0)
Neutrophils: 62 %
Platelets: 734 10*3/uL — ABNORMAL HIGH (ref 150–450)
RBC: 4.61 x10E6/uL (ref 3.77–5.28)
RDW: 14.5 % (ref 11.7–15.4)
WBC: 10.1 10*3/uL (ref 3.4–10.8)

## 2021-01-11 LAB — CMP14+EGFR

## 2021-01-13 LAB — HEPB+HEPC+HIV PANEL
HIV Screen 4th Generation wRfx: NONREACTIVE
Hep B E Ab: NEGATIVE

## 2021-01-13 NOTE — Telephone Encounter (Signed)
No answer, no voicemail, encounter closed 

## 2021-01-17 ENCOUNTER — Encounter: Payer: Self-pay | Admitting: Family Medicine

## 2021-01-17 ENCOUNTER — Ambulatory Visit (INDEPENDENT_AMBULATORY_CARE_PROVIDER_SITE_OTHER): Payer: BC Managed Care – PPO | Admitting: Family Medicine

## 2021-01-17 ENCOUNTER — Other Ambulatory Visit: Payer: Self-pay

## 2021-01-17 ENCOUNTER — Ambulatory Visit (INDEPENDENT_AMBULATORY_CARE_PROVIDER_SITE_OTHER): Payer: BC Managed Care – PPO

## 2021-01-17 VITALS — BP 131/87 | HR 88 | Temp 98.3°F | Ht 62.0 in | Wt 214.0 lb

## 2021-01-17 DIAGNOSIS — R0602 Shortness of breath: Secondary | ICD-10-CM | POA: Diagnosis not present

## 2021-01-17 DIAGNOSIS — R5383 Other fatigue: Secondary | ICD-10-CM | POA: Diagnosis not present

## 2021-01-17 DIAGNOSIS — R531 Weakness: Secondary | ICD-10-CM | POA: Diagnosis not present

## 2021-01-17 DIAGNOSIS — R42 Dizziness and giddiness: Secondary | ICD-10-CM

## 2021-01-17 DIAGNOSIS — R4189 Other symptoms and signs involving cognitive functions and awareness: Secondary | ICD-10-CM

## 2021-01-17 NOTE — Progress Notes (Signed)
Subjective:  Patient ID: Cynthia [name], female    DOB: 03-09-1966, 54 y.o.   MRN: [id number]  Patient Care Team: [name], DO as PCP - General (Family Medicine) Izora Gala, MD as Consulting Physician (Otolaryngology)   Chief Complaint:  pneumonia follow up   HPI: Cynthia [name] is a 55 y.o. female presenting on 01/17/2021 for pneumonia follow up   Pt presents today for ongoing symptoms post hospitalization for pneumonia. Pt states she continues to have exertional shortness of breath, fatigue, generalized weakness, brain fog, and ongoing dizziness. States the dizziness started during her hospital admission and has not improved. She states the dizziness can occur at any time, no specific contributing factors. She denies focal neurological deficits. She has not been using her inhalers as prescribed, states she does not know how to use them. No fever, chills, or confusion.    Relevant past medical, surgical, family, and social history reviewed and updated as indicated.  Allergies and medications reviewed and updated. Data reviewed: Chart in Epic.   Past Medical History:  Diagnosis Date   Allergy    High cholesterol    Hypertension    Migraine    Vertigo     Past Surgical History:  Procedure Laterality Date   ABDOMINAL HYSTERECTOMY     BREAST EXCISIONAL BIOPSY     BREAST SURGERY Left    lump removed    CESAREAN SECTION     x2   CYST REMOVAL LEG     FRACTURE SURGERY Right    Arm   TONSILLECTOMY      Social History   Socioeconomic History   Marital status: Married    Spouse name: Not on file   Number of children: Not on file   Years of education: Not on file   Highest education level: Not on file  Occupational History   Not on file  Tobacco Use   Smoking status: Former    Packs/day: 0.50    Years: 10.00    Pack years: 5.00    Types: Cigarettes    Quit date: 06/04/2016    Years since quitting: 4.6   Smokeless tobacco: Never  Vaping Use    Vaping Use: Never used  Substance and Sexual Activity   Alcohol use: Yes    Comment: occ   Drug use: No   Sexual activity: Not Currently    Partners: Male    Comment: Married  Other Topics Concern   Not on file  Social History Narrative   Lives at home w/ her husband   Left-handed   Caffeine: 2-3 sodas per day   Social Determinants of Health   Financial Resource Strain: Not on file  Food Insecurity: Not on file  Transportation Needs: Not on file  Physical Activity: Not on file  Stress: Not on file  Social Connections: Not on file  Intimate Partner Violence: Not on file    Outpatient Encounter Medications as of 01/17/2021  Medication Sig   albuterol (VENTOLIN HFA) 108 (90 Base) MCG/ACT inhaler Inhale 2 puffs into the lungs every 6 (six) hours as needed for wheezing or shortness of breath.   amLODipine (NORVASC) 10 MG tablet TAKE 1 TABLET BY MOUTH DAILY   atorvastatin (LIPITOR) 20 MG tablet TAKE 1 TABLET(20 MG) BY MOUTH DAILY   azithromycin (ZITHROMAX) 500 MG tablet Take 500 mg by mouth daily.   beclomethasone (QVAR) 80 MCG/ACT inhaler Inhale 1 puff into the lungs 2 (two) times daily. Rinse mouth,  controller med   cetirizine (ZYRTEC) 10 MG tablet Take 1 tablet (10 mg total) by mouth daily.   fluticasone (FLONASE) 50 MCG/ACT nasal spray Place 2 sprays into both nostrils daily.   hydrochlorothiazide (HYDRODIURIL) 25 MG tablet TAKE 1 TABLET(25 MG) BY MOUTH DAILY   hydrocortisone (ANUSOL-HC) 25 MG suppository Place 1 suppository (25 mg total) rectally at bedtime. Use for 5 days   ketorolac (ACULAR) 0.5 % ophthalmic solution Place 1 drop into both eyes 4 (four) times daily.   montelukast (SINGULAIR) 10 MG tablet Take 1 tablet (10 mg total) by mouth at bedtime.   Spacer/Aero-Holding Chambers DEVI 1 each by Does not apply route daily.   Tiotropium Bromide Monohydrate (SPIRIVA RESPIMAT) 2.5 MCG/ACT AERS Inhale 2 puffs into the lungs daily.   No facility-administered encounter  medications on file as of 01/17/2021.    No Known Allergies  Review of Systems  Constitutional:  Positive for activity change and fatigue. Negative for appetite change, chills, diaphoresis, fever and unexpected weight change.  HENT: Negative.    Eyes: Negative.  Negative for photophobia and visual disturbance.  Respiratory:  Positive for cough and shortness of breath. Negative for apnea, choking, chest tightness, wheezing and stridor.   Cardiovascular:  Negative for chest pain, palpitations and leg swelling.  Gastrointestinal:  Negative for abdominal pain, blood in stool, constipation, diarrhea, nausea and vomiting.  Endocrine: Negative.   Genitourinary:  Negative for decreased urine volume, difficulty urinating, dysuria, frequency and urgency.  Musculoskeletal:  Positive for arthralgias. Negative for myalgias.  Skin: Negative.   Allergic/Immunologic: Negative.   Neurological:  Positive for dizziness and weakness (generalized). Negative for tremors, seizures, syncope, facial asymmetry, speech difficulty, light-headedness, numbness and headaches.  Hematological: Negative.   Psychiatric/Behavioral:  Negative for confusion, hallucinations, sleep disturbance and suicidal ideas.   All other systems reviewed and are negative.      Objective:  BP 131/87   Pulse 88   Temp 98.3 F (36.8 C)   Ht _0  (1.575 m)   Wt 214 lb (97.1 kg)   SpO2 95%   BMI 39.14 kg/m    Wt Readings from Last 3 Encounters:  01/17/21 214 lb (97.1 kg)  01/03/21 217 lb (98.4 kg)  08/02/20 217 lb 12.8 oz (98.8 kg)    Physical Exam Vitals and nursing note reviewed.  Constitutional:      General: She is not in acute distress.    Appearance: Normal appearance. She is well-developed and well-groomed. She is obese. She is not ill-appearing, toxic-appearing or diaphoretic.  HENT:     Head: Normocephalic and atraumatic.     Jaw: There is normal jaw occlusion.     Right Ear: Hearing normal.     Left Ear: Hearing  normal.     Nose: Nose normal.     Mouth/Throat:     Lips: Pink.     Mouth: Mucous membranes are moist.     Pharynx: Oropharynx is clear. Uvula midline.  Eyes:     General: Lids are normal.     Extraocular Movements: Extraocular movements intact.     Conjunctiva/sclera: Conjunctivae normal.     Pupils: Pupils are equal, round, and reactive to light.  Neck:     Thyroid: No thyroid mass, thyromegaly or thyroid tenderness.     Vascular: No carotid bruit or JVD.     Trachea: Trachea and phonation normal.  Cardiovascular:     Rate and Rhythm: Normal rate and regular rhythm.     Chest Wall:  PMI is not displaced.     Pulses: Normal pulses.     Heart sounds: Normal heart sounds. No murmur heard.   No friction rub. No gallop.  Pulmonary:     Effort: Pulmonary effort is normal. No respiratory distress.     Breath sounds: Normal breath sounds. No stridor. No wheezing, rhonchi or rales.  Chest:     Chest wall: No tenderness.  Abdominal:     General: Bowel sounds are normal. There is no distension or abdominal bruit.     Palpations: Abdomen is soft. There is no hepatomegaly or splenomegaly.     Tenderness: There is no abdominal tenderness. There is no right CVA tenderness or left CVA tenderness.     Hernia: No hernia is present.  Musculoskeletal:        General: Tenderness (right knee) present. Normal range of motion.     Cervical back: Normal range of motion and neck supple.     Right lower leg: No edema.     Left lower leg: No edema.  Lymphadenopathy:     Cervical: No cervical adenopathy.  Skin:    General: Skin is warm and dry.     Capillary Refill: Capillary refill takes less than 2 seconds.     Coloration: Skin is not cyanotic, jaundiced or pale.     Findings: No rash.  Neurological:     General: No focal deficit present.     Mental Status: She is alert and oriented to person, place, and time.     Cranial Nerves: Cranial nerves are intact. No cranial nerve deficit.     Sensory:  Sensation is intact. No sensory deficit.     Motor: Motor function is intact. No weakness.     Coordination: Coordination is intact. Coordination normal.     Gait: Gait is intact. Gait normal.     Deep Tendon Reflexes: Reflexes are normal and symmetric. Reflexes normal.  Psychiatric:        Attention and Perception: Attention and perception normal.        Mood and Affect: Mood and affect normal.        Speech: Speech normal.        Behavior: Behavior normal. Behavior is cooperative.        Thought Content: Thought content normal.        Cognition and Memory: Cognition and memory normal.        Judgment: Judgment normal.    Results for orders placed or performed in visit on 01/03/21  CBC with Differential/Platelet  Result Value Ref Range   WBC 10.1 3.4 - 10.8 x10E3/uL   RBC 4.61 3.77 - 5.28 x10E6/uL   Hemoglobin 11.9 11.1 - 15.9 g/dL   Hematocrit 37.2 34.0 - 46.6 %   MCV 81 79 - 97 fL   MCH 25.8 (L) 26.6 - 33.0 pg   MCHC 32.0 31.5 - 35.7 g/dL   RDW 14.5 11.7 - 15.4 %   Platelets 734 (H) 150 - 450 x10E3/uL   Neutrophils 62 Not Estab. %   Lymphs 23 Not Estab. %   Monocytes 5 Not Estab. %   Eos 4 Not Estab. %   Basos 1 Not Estab. %   Neutrophils Absolute 6.2 1.4 - 7.0 x10E3/uL   Lymphocytes Absolute 2.3 0.7 - 3.1 x10E3/uL   Monocytes Absolute 0.5 0.1 - 0.9 x10E3/uL   EOS (ABSOLUTE) 0.4 0.0 - 0.4 x10E3/uL   Basophils Absolute 0.1 0.0 - 0.2 x10E3/uL   Immature Granulocytes  5 Not Estab. %   Immature Grans (Abs) 0.5 (H) 0.0 - 0.1 x10E3/uL  CMP14+EGFR  Result Value Ref Range   Glucose CANCELED mg/dL   BUN CANCELED    Creatinine, Ser CANCELED    Sodium CANCELED    Potassium CANCELED    Chloride CANCELED    CO2 CANCELED    Calcium CANCELED    Total Protein CANCELED    Albumin CANCELED    Bilirubin Total CANCELED    Alkaline Phosphatase CANCELED    AST CANCELED    ALT CANCELED   HepB+HepC+HIV Panel  Result Value Ref Range   Hepatitis B Surface Ag CANCELED    Hep B E Ag  CANCELED    Hep B C IgM CANCELED    Hep B Core Total Ab CANCELED    Hep B E Ab Negative Negative   Hep B Surface Ab, Qual CANCELED    Hep C Virus Ab CANCELED s/co ratio   HIV Screen 4th Generation wRfx Non Reactive Non Reactive     X-Ray: CXR: Improving, No acute findings. Preliminary x-ray reading by Monia Pouch, FNP-C, [facility].   Pertinent labs & imaging results that were available during my care of the patient were reviewed by me and considered in my medical decision making.  Assessment & Plan:  Cynthia [name] was seen today for pneumonia follow up.  Diagnoses and all orders for this visit:  Shortness of breath CXR improving. Will recheck CBC today. Pt instructed on proper use of inhalers in office. Pt verbalized and demonstrated proper use of both during visit. Pt aware to report any new, worsening, or persistent symptoms.  -     DG Chest 2 View; Future -     CBC with Differential/Platelet  General weakness Other fatigue Brain fog Dizziness  Ongoing since hospitalization for pneumonia. Will check below labs as pt states she does not feel she is improving. Adequate hydration discussed in detail. Will also obtain CT head as pt has had ongoing weakness and dizziness since hospitalization.  -     CBC with Differential/Platelet -     VITAMIN D 25 Hydroxy (Vit-D Deficiency, Fractures) -     Vitamin B12 -     Thyroid Panel With TSH -     CMP14+EGFR -     CT HEAD WO CONTRAST (5MM); Future    Continue all other maintenance medications.  Follow up plan: Return in about 3 months (around 04/19/2021), or if symptoms worsen or fail to improve.   Continue healthy lifestyle choices, including diet (rich in fruits, vegetables, and lean proteins, and low in salt and simple carbohydrates) and exercise (at least 30 minutes of moderate physical activity daily).  Educational handout given for dizziness  The above assessment and management plan was discussed with the patient. The patient  verbalized understanding of and has agreed to the management plan. Patient is aware to call the clinic if they develop any new symptoms or if symptoms persist or worsen. Patient is aware when to return to the clinic for a follow-up visit. Patient educated on when it is appropriate to go to the emergency department.   Monia Pouch, FNP-C [facility] [phone]

## 2021-01-18 LAB — CMP14+EGFR
ALT: 26 IU/L (ref 0–32)
AST: 15 IU/L (ref 0–40)
Albumin/Globulin Ratio: 1.6 (ref 1.2–2.2)
Albumin: 4 g/dL (ref 3.8–4.9)
Alkaline Phosphatase: 124 IU/L — ABNORMAL HIGH (ref 44–121)
BUN/Creatinine Ratio: 15 (ref 9–23)
BUN: 13 mg/dL (ref 6–24)
Bilirubin Total: 0.4 mg/dL (ref 0.0–1.2)
CO2: 22 mmol/L (ref 20–29)
Calcium: 9.5 mg/dL (ref 8.7–10.2)
Chloride: 105 mmol/L (ref 96–106)
Creatinine, Ser: 0.89 mg/dL (ref 0.57–1.00)
Globulin, Total: 2.5 g/dL (ref 1.5–4.5)
Glucose: 95 mg/dL (ref 70–99)
Potassium: 4 mmol/L (ref 3.5–5.2)
Sodium: 142 mmol/L (ref 134–144)
Total Protein: 6.5 g/dL (ref 6.0–8.5)
eGFR: 76 mL/min/{1.73_m2} (ref >59)

## 2021-01-18 LAB — VITAMIN D 25 HYDROXY (VIT D DEFICIENCY, FRACTURES): Vit D, 25-Hydroxy: 26.2 ng/mL — ABNORMAL LOW (ref 30.0–100.0)

## 2021-01-18 LAB — CBC WITH DIFFERENTIAL/PLATELET
Basophils Absolute: 0.1 10*3/uL (ref 0.0–0.2)
Basos: 1 %
EOS (ABSOLUTE): 0.1 10*3/uL (ref 0.0–0.4)
Eos: 2 %
Hematocrit: 34 % (ref 34.0–46.6)
Hemoglobin: 11.2 g/dL (ref 11.1–15.9)
Immature Grans (Abs): 0 10*3/uL (ref 0.0–0.1)
Immature Granulocytes: 0 %
Lymphocytes Absolute: 2.7 10*3/uL (ref 0.7–3.1)
Lymphs: 40 %
MCH: 27.2 pg (ref 26.6–33.0)
MCHC: 32.9 g/dL (ref 31.5–35.7)
MCV: 83 fL (ref 79–97)
Monocytes Absolute: 0.5 10*3/uL (ref 0.1–0.9)
Monocytes: 7 %
Neutrophils Absolute: 3.3 10*3/uL (ref 1.4–7.0)
Neutrophils: 50 %
Platelets: 431 10*3/uL (ref 150–450)
RBC: 4.12 x10E6/uL (ref 3.77–5.28)
RDW: 15.5 % — ABNORMAL HIGH (ref 11.7–15.4)
WBC: 6.6 10*3/uL (ref 3.4–10.8)

## 2021-01-18 LAB — VITAMIN B12: Vitamin B-12: 671 pg/mL (ref 232–1245)

## 2021-01-18 LAB — THYROID PANEL WITH TSH
Free Thyroxine Index: 1.8 (ref 1.2–4.9)
T3 Uptake Ratio: 26 % (ref 24–39)
T4, Total: 6.8 ug/dL (ref 4.5–12.0)
TSH: 0.811 u[IU]/mL (ref 0.450–4.500)

## 2021-01-25 ENCOUNTER — Telehealth: Payer: Self-pay | Admitting: Family Medicine

## 2021-01-25 NOTE — Telephone Encounter (Signed)
Patient had hospitalization for pneumonia two weeks ago and was seen last week by Monia Pouch for hospital follow up.  She reports that she still feels very weak and tired and has episodes of shortness of breath.  She would like to follow up with PCP, Dr. Lajuana Ripple, to see if this is normal and if the pneumonia has resolved.  Appointment scheduled 01/27/21 at 8:00 am with Dr. Lajuana Ripple.

## 2021-01-27 ENCOUNTER — Other Ambulatory Visit: Payer: Self-pay

## 2021-01-27 ENCOUNTER — Ambulatory Visit (INDEPENDENT_AMBULATORY_CARE_PROVIDER_SITE_OTHER): Payer: BC Managed Care – PPO

## 2021-01-27 ENCOUNTER — Encounter: Payer: Self-pay | Admitting: Family Medicine

## 2021-01-27 ENCOUNTER — Ambulatory Visit (INDEPENDENT_AMBULATORY_CARE_PROVIDER_SITE_OTHER): Payer: BC Managed Care – PPO | Admitting: Family Medicine

## 2021-01-27 VITALS — BP 134/89 | HR 84 | Temp 97.7°F | Ht 62.0 in | Wt 218.6 lb

## 2021-01-27 DIAGNOSIS — J410 Simple chronic bronchitis: Secondary | ICD-10-CM | POA: Diagnosis not present

## 2021-01-27 DIAGNOSIS — G9339 Other post infection and related fatigue syndromes: Secondary | ICD-10-CM

## 2021-01-27 DIAGNOSIS — J189 Pneumonia, unspecified organism: Secondary | ICD-10-CM | POA: Diagnosis not present

## 2021-01-27 DIAGNOSIS — R42 Dizziness and giddiness: Secondary | ICD-10-CM | POA: Diagnosis not present

## 2021-01-27 MED ORDER — SYMBICORT 160-4.5 MCG/ACT IN AERO: 2.0000 | INHALATION_SPRAY | Freq: Two times a day (BID) | RESPIRATORY_TRACT | 12 refills | Status: DC

## 2021-01-27 MED ORDER — MECLIZINE HCL 12.5 MG PO TABS: 12.5000 mg | ORAL_TABLET | Freq: Three times a day (TID) | ORAL | 0 refills | Status: DC | PRN

## 2021-01-27 NOTE — Progress Notes (Signed)
Subjective: CC: Fatigue and shortness of breath PCP: Cynthia Norlander, DO Cynthia [name] is a 55 y.o. female presenting to clinic today for:  1.  Fatigue and shortness of breath Patient unfortunately developed a community-acquired pneumonia in the right lower lung back in September.  She continues to struggle with fatigue and has subsequently been out of work.  Her return to work is expected on Tuesday and she is here today for reevaluation as she does not feel that she can return because she continues to give out easily.  She reports shortness of breath but then after further questioning denies losing her breath she simply feels too tired to carry on.  No hemoptysis or fevers reported.  She is not utilizing Qvar but does have albuterol inhaler on hand if needed.  She had an extensive laboratory work-up performed about a week and a half ago by her new PCP Cynthia [name] and this was unremarkable.   ROS: Per HPI  No Known Allergies Past Medical History:  Diagnosis Date   Allergy    High cholesterol    Hypertension    Migraine    Vertigo     Current Outpatient Medications:    albuterol (VENTOLIN HFA) 108 (90 Base) MCG/ACT inhaler, Inhale 2 puffs into the lungs every 6 (six) hours as needed for wheezing or shortness of breath., Disp: 8 g, Rfl: 0   amLODipine (NORVASC) 10 MG tablet, TAKE 1 TABLET BY MOUTH DAILY, Disp: 90 tablet, Rfl: 3   atorvastatin (LIPITOR) 20 MG tablet, TAKE 1 TABLET(20 MG) BY MOUTH DAILY, Disp: 90 tablet, Rfl: 3   azithromycin (ZITHROMAX) 500 MG tablet, Take 500 mg by mouth daily., Disp: , Rfl:    beclomethasone (QVAR) 80 MCG/ACT inhaler, Inhale 1 puff into the lungs 2 (two) times daily. Rinse mouth, controller med, Disp: 2 each, Rfl: 5   cetirizine (ZYRTEC) 10 MG tablet, Take 1 tablet (10 mg total) by mouth daily., Disp: 30 tablet, Rfl: 11   fluticasone (FLONASE) 50 MCG/ACT nasal spray, Place 2 sprays into both nostrils daily., Disp: 16 g, Rfl: 6    hydrochlorothiazide (HYDRODIURIL) 25 MG tablet, TAKE 1 TABLET(25 MG) BY MOUTH DAILY, Disp: 90 tablet, Rfl: 3   hydrocortisone (ANUSOL-HC) 25 MG suppository, Place 1 suppository (25 mg total) rectally at bedtime. Use for 5 days, Disp: 5 suppository, Rfl: 0   ketorolac (ACULAR) 0.5 % ophthalmic solution, Place 1 drop into both eyes 4 (four) times daily., Disp: 10 mL, Rfl: 5   montelukast (SINGULAIR) 10 MG tablet, Take 1 tablet (10 mg total) by mouth at bedtime., Disp: 90 tablet, Rfl: 3   Spacer/Aero-Holding Chambers DEVI, 1 each by Does not apply route daily., Disp: 1 each, Rfl: 0   Tiotropium Bromide Monohydrate (SPIRIVA RESPIMAT) 2.5 MCG/ACT AERS, Inhale 2 puffs into the lungs daily., Disp: 4 g, Rfl: 6 Social History   Socioeconomic History   Marital status: Married    Spouse name: Not on file   Number of children: Not on file   Years of education: Not on file   Highest education level: Not on file  Occupational History   Not on file  Tobacco Use   Smoking status: Former    Packs/day: 0.50    Years: 10.00    Pack years: 5.00    Types: Cigarettes    Quit date: 06/04/2016    Years since quitting: 4.6   Smokeless tobacco: Never  Vaping Use   Vaping Use: Never used  Substance and Sexual  Activity   Alcohol use: Yes    Comment: occ   Drug use: No   Sexual activity: Not Currently    Partners: Male    Comment: Married  Other Topics Concern   Not on file  Social History Narrative   Lives at home w/ her husband   Left-handed   Caffeine: 2-3 sodas per day   Social Determinants of Health   Financial Resource Strain: Not on file  Food Insecurity: Not on file  Transportation Needs: Not on file  Physical Activity: Not on file  Stress: Not on file  Social Connections: Not on file  Intimate Partner Violence: Not on file   Family History  Problem Relation Age of Onset   Heart failure Mother    Hypertension Father    Kidney disease Father    Cancer Maternal Grandmother    Cancer  Maternal Aunt    Colon cancer Neg Hx    Esophageal cancer Neg Hx    Rectal cancer Neg Hx    Stomach cancer Neg Hx     Objective: Office vital signs reviewed. BP 134/89   Pulse 84   Temp 97.7 F (36.5 C)   Ht 5\' 2"  (1.575 m)   Wt 218 lb 9.6 oz (99.2 kg)   SpO2 97%   BMI 39.98 kg/m   Physical Examination:  General: Awake, alert, well nourished, nontoxic appearing female. No acute distress HEENT: Normal, sclera white. MMM Cardio: regular rate and rhythm, S1S2 heard, no murmurs appreciated Pulm: clear to auscultation bilaterally, no wheezes, rhonchi or rales; normal work of breathing on room air MSK: normal gait and station  Assessment/ Plan: 55 y.o. female   Community acquired pneumonia of right lower lobe of lung - Plan: DG Chest 2 View  Other post infection and related fatigue syndromes  Simple chronic bronchitis (HCC) - Plan: SYMBICORT 160-4.5 MCG/ACT inhaler  Dizziness - Plan: meclizine (ANTIVERT) 12.5 MG tablet  Personal review of the chest x-ray reveals no ongoing infiltrates.  Radiology review confirms that pneumonia appears to be resolved.  I suspect that her ongoing fatigue is postinfectious as her metabolic work-up was totally unremarkable.  We will have her try Symbicort and lieu of the Qvar to see if perhaps this might help with her perceived shortness of breath.  She had mentioned some intermittent sensations of dizziness during the exam and therefore I sent in meclizine to see if this might help.  I extended her work note for another week but I would like her to follow-up with PCP.  I encouraged her to push herself this week to gradually do more.  No orders of the defined types were placed in this encounter.  No orders of the defined types were placed in this encounter.    Cynthia Norlander, DO [place] [phone]

## 2021-01-27 NOTE — Patient Instructions (Addendum)
As we discussed, fatigue is VERY common after an illness and can last several weeks before you start getting back to baseline.  It is important for you to try and stay as physically active as able because your body become DEconditioned during an illness.  I have sent symbicort inhaler to use twice daily in addition to your spiriva daily  Fatigue If you have fatigue, you feel tired all the time and have a lack of energy or a lack of motivation. Fatigue may make it difficult to start or complete tasks because of exhaustion. In general, occasional or mild fatigue is often a normal response to activity or life. However, long-lasting (chronic) or extreme fatigue may be a symptom of a medical condition. Follow these instructions at home: General instructions Watch your fatigue for any changes. Go to bed and get up at the same time every day. Avoid fatigue by pacing yourself during the day and getting enough sleep at night. Maintain a healthy weight. Medicines Take over-the-counter and prescription medicines only as told by your health care provider. Take a multivitamin, if told by your health care provider.  Do not use herbal or dietary supplements unless they are approved by your health care provider. Activity  Exercise regularly, as told by your health care provider. Use or practice techniques to help you relax, such as yoga, tai chi, meditation, or massage therapy. Eating and drinking  Avoid heavy meals in the evening. Eat a well-balanced diet, which includes lean proteins, whole grains, plenty of fruits and vegetables, and low-fat dairy products. Avoid consuming too much caffeine. Avoid the use of alcohol. Drink enough fluid to keep your urine pale yellow. Lifestyle Change situations that cause you stress. Try to keep your work and personal schedule in balance. Do not use any products that contain nicotine or tobacco, such as cigarettes and e-cigarettes. If you need help quitting, ask your  health care provider. Do not use drugs. Contact a health care provider if: Your fatigue does not get better. You have a fever. You suddenly lose or gain weight. You have headaches. You have trouble falling asleep or sleeping through the night. You feel angry, guilty, anxious, or sad. You are unable to have a bowel movement (constipation). Your skin is dry. You have swelling in your legs or another part of your body. Get help right away if: You feel confused. Your vision is blurry. You feel faint or you pass out. You have a severe headache. You have severe pain in your abdomen, your back, or the area between your waist and hips (pelvis). You have chest pain, shortness of breath, or an irregular or fast heartbeat. You are unable to urinate, or you urinate less than normal. You have abnormal bleeding, such as bleeding from the rectum, vagina, nose, lungs, or nipples. You vomit blood. You have thoughts about hurting yourself or others. If you ever feel like you may hurt yourself or others, or have thoughts about taking your own life, get help right away. You can go to your nearest emergency department or call: Your local emergency services (911 in the [place] A suicide crisis helpline, such as the [place] at 470-532-5324. This is open 24 hours a day. Summary If you have fatigue, you feel tired all the time and have a lack of energy or a lack of motivation. Fatigue may make it difficult to start or complete tasks because of exhaustion. Long-lasting (chronic) or extreme fatigue may be a symptom of a medical  condition. Exercise regularly, as told by your health care provider. Change situations that cause you stress. Try to keep your work and personal schedule in balance. This information is not intended to replace advice given to you by your health care provider. Make sure you discuss any questions you have with your health care provider. Document Revised:  02/04/2020 Document Reviewed: 02/04/2020 Elsevier Patient Education  2022 [employer].

## 2021-01-31 ENCOUNTER — Ambulatory Visit (HOSPITAL_BASED_OUTPATIENT_CLINIC_OR_DEPARTMENT_OTHER)
Admission: RE | Admit: 2021-01-31 | Discharge: 2021-01-31 | Disposition: A | Discharge status: Home / Self Care | Payer: BC Managed Care – PPO | Source: Ambulatory Visit | Attending: Family Medicine | Admitting: Family Medicine

## 2021-01-31 ENCOUNTER — Other Ambulatory Visit: Payer: Self-pay

## 2021-01-31 ENCOUNTER — Encounter (HOSPITAL_BASED_OUTPATIENT_CLINIC_OR_DEPARTMENT_OTHER): Payer: Self-pay

## 2021-01-31 DIAGNOSIS — R4189 Other symptoms and signs involving cognitive functions and awareness: Secondary | ICD-10-CM | POA: Insufficient documentation

## 2021-01-31 DIAGNOSIS — R531 Weakness: Secondary | ICD-10-CM | POA: Insufficient documentation

## 2021-01-31 DIAGNOSIS — R42 Dizziness and giddiness: Secondary | ICD-10-CM | POA: Insufficient documentation

## 2021-02-14 ENCOUNTER — Ambulatory Visit: Payer: BC Managed Care – PPO | Admitting: Family Medicine

## 2021-02-17 ENCOUNTER — Ambulatory Visit (HOSPITAL_COMMUNITY): Payer: BC Managed Care – PPO

## 2021-04-19 ENCOUNTER — Ambulatory Visit: Payer: BC Managed Care – PPO | Admitting: Family Medicine

## 2021-05-03 ENCOUNTER — Ambulatory Visit: Payer: BC Managed Care – PPO | Admitting: Family Medicine

## 2021-05-09 ENCOUNTER — Encounter: Payer: Self-pay | Admitting: Family Medicine

## 2021-05-09 ENCOUNTER — Ambulatory Visit: Payer: BC Managed Care – PPO | Admitting: Family Medicine

## 2021-05-09 VITALS — BP 129/80 | HR 85 | Temp 97.7°F | Ht 62.0 in | Wt 233.0 lb

## 2021-05-09 DIAGNOSIS — Z23 Encounter for immunization: Secondary | ICD-10-CM | POA: Diagnosis not present

## 2021-05-09 DIAGNOSIS — E559 Vitamin D deficiency, unspecified: Secondary | ICD-10-CM

## 2021-05-09 DIAGNOSIS — H1013 Acute atopic conjunctivitis, bilateral: Secondary | ICD-10-CM | POA: Insufficient documentation

## 2021-05-09 DIAGNOSIS — J410 Simple chronic bronchitis: Secondary | ICD-10-CM

## 2021-05-09 DIAGNOSIS — I10 Essential (primary) hypertension: Secondary | ICD-10-CM

## 2021-05-09 DIAGNOSIS — E782 Mixed hyperlipidemia: Secondary | ICD-10-CM | POA: Insufficient documentation

## 2021-05-09 DIAGNOSIS — Z1231 Encounter for screening mammogram for malignant neoplasm of breast: Secondary | ICD-10-CM

## 2021-05-09 LAB — LIPID PANEL

## 2021-05-09 LAB — BAYER DCA HB A1C WAIVED: HB A1C (BAYER DCA - WAIVED): 5.4 % (ref 4.8–5.6)

## 2021-05-09 MED ORDER — SYMBICORT 160-4.5 MCG/ACT IN AERO: 2.0000 | INHALATION_SPRAY | Freq: Two times a day (BID) | RESPIRATORY_TRACT | 12 refills | Status: DC

## 2021-05-09 MED ORDER — ALBUTEROL SULFATE HFA 108 (90 BASE) MCG/ACT IN AERS: 2.0000 | INHALATION_SPRAY | Freq: Four times a day (QID) | RESPIRATORY_TRACT | 0 refills | Status: DC | PRN

## 2021-05-09 MED ORDER — OLOPATADINE HCL 0.1 % OP SOLN: 1.0000 [drp] | Freq: Two times a day (BID) | OPHTHALMIC | 12 refills | Status: DC

## 2021-05-09 NOTE — Progress Notes (Signed)
Subjective:  Patient ID: Cynthia [name], female    DOB: 26-Apr-1965, 56 y.o.   MRN: [id number]  Patient Care Team: Baruch Gouty, FNP as PCP - General (Family Medicine) Izora Gala, MD as Consulting Physician (Otolaryngology)   Chief Complaint:  Medical Management of Chronic Issues   HPI: Cynthia [name] is a 56 y.o. female presenting on 05/09/2021 for Medical Management of Chronic Issues   1. Simple chronic bronchitis (HCC) Pt states she has not been using her daily inhalers as prescribed and does have some shortness of  breath with exertion. She does use her albuterol if symptoms are moderate to severe. States this does help with wheezing and shortness of breath but does cause her heart to race. No other associated symptoms.  2. Allergic conjunctivitis of both eyes Bilateral eye watering for the last several weeks with pruritis at times. No erythema, drainage, visual changes, or pain.   3. Morbid obesity ([place]) Does not follow a diet or exercise on a regular basis.   4. Vitamin D deficiency Has been taking repletion therapy since 01/2021. Denies arthralgias or difficulty walking.   5. Essential hypertension Well controlled on norvasc and HCTZ. No chest pain, leg swelling, headaches, or visual changes.   6. Hyperlipidemia On statin therapy. Does not follow a diet or exercise routine.      Relevant past medical, surgical, family, and social history reviewed and updated as indicated.  Allergies and medications reviewed and updated. Data reviewed: Chart in Epic.   Past Medical History:  Diagnosis Date   Allergy    High cholesterol    Hypertension    Migraine    Vertigo     Past Surgical History:  Procedure Laterality Date   ABDOMINAL HYSTERECTOMY     BREAST EXCISIONAL BIOPSY     BREAST SURGERY Left    lump removed    CESAREAN SECTION     x2   CYST REMOVAL LEG     FRACTURE SURGERY Right    Arm   TONSILLECTOMY      Social History   Socioeconomic  History   Marital status: Married    Spouse name: Not on file   Number of children: Not on file   Years of education: Not on file   Highest education level: Not on file  Occupational History   Not on file  Tobacco Use   Smoking status: Former    Packs/day: 0.50    Years: 10.00    Pack years: 5.00    Types: Cigarettes    Quit date: 06/04/2016    Years since quitting: 4.9   Smokeless tobacco: Never  Vaping Use   Vaping Use: Never used  Substance and Sexual Activity   Alcohol use: Yes    Comment: occ   Drug use: No   Sexual activity: Not Currently    Partners: Male    Comment: Married  Other Topics Concern   Not on file  Social History Narrative   Lives at home w/ her husband   Left-handed   Caffeine: 2-3 sodas per day   Social Determinants of Health   Financial Resource Strain: Not on file  Food Insecurity: Not on file  Transportation Needs: Not on file  Physical Activity: Not on file  Stress: Not on file  Social Connections: Not on file  Intimate Partner Violence: Not on file    Outpatient Encounter Medications as of 05/09/2021  Medication Sig   amLODipine (NORVASC) 10 MG tablet  TAKE 1 TABLET BY MOUTH DAILY   atorvastatin (LIPITOR) 20 MG tablet TAKE 1 TABLET(20 MG) BY MOUTH DAILY   hydrochlorothiazide (HYDRODIURIL) 25 MG tablet Take 25 mg by mouth daily.   olopatadine (PATADAY) 0.1 % ophthalmic solution Place 1 drop into both eyes 2 (two) times daily.   [DISCONTINUED] albuterol (VENTOLIN HFA) 108 (90 Base) MCG/ACT inhaler Inhale 2 puffs into the lungs every 6 (six) hours as needed for wheezing or shortness of breath.   [DISCONTINUED] SYMBICORT 160-4.5 MCG/ACT inhaler Inhale 2 puffs into the lungs in the morning and at bedtime.   albuterol (VENTOLIN HFA) 108 (90 Base) MCG/ACT inhaler Inhale 2 puffs into the lungs every 6 (six) hours as needed for wheezing or shortness of breath.   SYMBICORT 160-4.5 MCG/ACT inhaler Inhale 2 puffs into the lungs in the morning and at  bedtime.   [DISCONTINUED] cetirizine (ZYRTEC) 10 MG tablet Take 1 tablet (10 mg total) by mouth daily.   [DISCONTINUED] fluticasone (FLONASE) 50 MCG/ACT nasal spray Place 2 sprays into both nostrils daily.   [DISCONTINUED] meclizine (ANTIVERT) 12.5 MG tablet Take 1 tablet (12.5 mg total) by mouth 3 (three) times daily as needed for dizziness.   [DISCONTINUED] montelukast (SINGULAIR) 10 MG tablet Take 1 tablet (10 mg total) by mouth at bedtime.   [DISCONTINUED] Spacer/Aero-Holding Josiah Lobo DEVI 1 each by Does not apply route daily.   [DISCONTINUED] Tiotropium Bromide Monohydrate (SPIRIVA RESPIMAT) 2.5 MCG/ACT AERS Inhale 2 puffs into the lungs daily.   No facility-administered encounter medications on file as of 05/09/2021.    No Known Allergies  Review of Systems  Constitutional:  Positive for fatigue. Negative for activity change, appetite change, chills, diaphoresis, fever and unexpected weight change.  Eyes:  Positive for discharge and itching. Negative for photophobia, pain, redness and visual disturbance.  Respiratory:  Positive for cough, shortness of breath and wheezing. Negative for apnea, choking, chest tightness and stridor.   Cardiovascular:  Negative for chest pain, palpitations and leg swelling.  Gastrointestinal:  Negative for abdominal pain.  Genitourinary:  Negative for decreased urine volume and difficulty urinating.  Musculoskeletal:  Positive for arthralgias (right knee, recent surgery). Negative for back pain, gait problem, joint swelling, myalgias, neck pain and neck stiffness.  Neurological:  Negative for dizziness, tremors, seizures, syncope, facial asymmetry, speech difficulty, weakness, light-headedness, numbness and headaches.  Psychiatric/Behavioral:  Negative for agitation, behavioral problems, confusion, decreased concentration, dysphoric mood, hallucinations, self-injury, sleep disturbance and suicidal ideas. The patient is not nervous/anxious and is not  hyperactive.   All other systems reviewed and are negative.      Objective:  BP 129/80    Pulse 85    Temp 97.7 F (36.5 C) (Temporal)    Ht '5\' 2"'  (1.575 m)    Wt 233 lb (105.7 kg)    SpO2 94%    BMI 42.62 kg/m    Wt Readings from Last 3 Encounters:  05/09/21 233 lb (105.7 kg)  01/27/21 218 lb 9.6 oz (99.2 kg)  01/17/21 214 lb (97.1 kg)    Physical Exam Vitals and nursing note reviewed.  Constitutional:      General: She is not in acute distress.    Appearance: Normal appearance. She is morbidly obese. She is not ill-appearing, toxic-appearing or diaphoretic.  HENT:     Head: Normocephalic and atraumatic.     Right Ear: Tympanic membrane, ear canal and external ear normal.     Left Ear: Tympanic membrane, ear canal and external ear normal.  Nose: Nose normal.     Mouth/Throat:     Mouth: Mucous membranes are moist.     Pharynx: No oropharyngeal exudate or posterior oropharyngeal erythema.  Eyes:     Extraocular Movements: Extraocular movements intact.     Conjunctiva/sclera: Conjunctivae normal.     Pupils: Pupils are equal, round, and reactive to light.  Cardiovascular:     Rate and Rhythm: Normal rate and regular rhythm.     Heart sounds: Normal heart sounds. No murmur heard.   No friction rub. No gallop.  Pulmonary:     Effort: Pulmonary effort is normal. No respiratory distress.     Breath sounds: Normal breath sounds. No wheezing, rhonchi or rales.  Abdominal:     General: Bowel sounds are normal.     Palpations: Abdomen is soft.  Musculoskeletal:     Cervical back: Normal range of motion and neck supple.     Comments: Brace to right knee, recent meniscus repair  Skin:    General: Skin is warm and dry.     Capillary Refill: Capillary refill takes less than 2 seconds.  Neurological:     General: No focal deficit present.     Mental Status: She is alert and oriented to person, place, and time.  Psychiatric:        Mood and Affect: Mood normal.         Behavior: Behavior normal.        Thought Content: Thought content normal.        Judgment: Judgment normal.    Results for orders placed or performed in visit on 01/17/21  CBC with Differential/Platelet  Result Value Ref Range   WBC 6.6 3.4 - 10.8 x10E3/uL   RBC 4.12 3.77 - 5.28 x10E6/uL   Hemoglobin 11.2 11.1 - 15.9 g/dL   Hematocrit 34.0 34.0 - 46.6 %   MCV 83 79 - 97 fL   MCH 27.2 26.6 - 33.0 pg   MCHC 32.9 31.5 - 35.7 g/dL   RDW 15.5 (H) 11.7 - 15.4 %   Platelets 431 150 - 450 x10E3/uL   Neutrophils 50 Not Estab. %   Lymphs 40 Not Estab. %   Monocytes 7 Not Estab. %   Eos 2 Not Estab. %   Basos 1 Not Estab. %   Neutrophils Absolute 3.3 1.4 - 7.0 x10E3/uL   Lymphocytes Absolute 2.7 0.7 - 3.1 x10E3/uL   Monocytes Absolute 0.5 0.1 - 0.9 x10E3/uL   EOS (ABSOLUTE) 0.1 0.0 - 0.4 x10E3/uL   Basophils Absolute 0.1 0.0 - 0.2 x10E3/uL   Immature Granulocytes 0 Not Estab. %   Immature Grans (Abs) 0.0 0.0 - 0.1 x10E3/uL  VITAMIN D 25 Hydroxy (Vit-D Deficiency, Fractures)  Result Value Ref Range   Vit D, 25-Hydroxy 26.2 (L) 30.0 - 100.0 ng/mL  Vitamin B12  Result Value Ref Range   Vitamin B-12 671 232 - 1,245 pg/mL  Thyroid Panel With TSH  Result Value Ref Range   TSH 0.811 0.450 - 4.500 uIU/mL   T4, Total 6.8 4.5 - 12.0 ug/dL   T3 Uptake Ratio 26 24 - 39 %   Free Thyroxine Index 1.8 1.2 - 4.9  CMP14+EGFR  Result Value Ref Range   Glucose 95 70 - 99 mg/dL   BUN 13 6 - 24 mg/dL   Creatinine, Ser 0.89 0.57 - 1.00 mg/dL   eGFR 76 >59 mL/min/1.73   BUN/Creatinine Ratio 15 9 - 23   Sodium 142 134 - 144 mmol/L  Potassium 4.0 3.5 - 5.2 mmol/L   Chloride 105 96 - 106 mmol/L   CO2 22 20 - 29 mmol/L   Calcium 9.5 8.7 - 10.2 mg/dL   Total Protein 6.5 6.0 - 8.5 g/dL   Albumin 4.0 3.8 - 4.9 g/dL   Globulin, Total 2.5 1.5 - 4.5 g/dL   Albumin/Globulin Ratio 1.6 1.2 - 2.2   Bilirubin Total 0.4 0.0 - 1.2 mg/dL   Alkaline Phosphatase 124 (H) 44 - 121 IU/L   AST 15 0 - 40 IU/L    ALT 26 0 - 32 IU/L       Pertinent labs & imaging results that were available during my care of the patient were reviewed by me and considered in my medical decision making.  Assessment & Plan:  Cynthia [name] was seen today for medical management of chronic issues.  Diagnoses and all orders for this visit:  Simple chronic bronchitis ([place]) Importance of compliance with inhalers discussed in detail. Pt aware to report any new or worsening symptoms. Medications as prescribed.  -     albuterol (VENTOLIN HFA) 108 (90 Base) MCG/ACT inhaler; Inhale 2 puffs into the lungs every 6 (six) hours as needed for wheezing or shortness of breath. -     SYMBICORT 160-4.5 MCG/ACT inhaler; Inhale 2 puffs into the lungs in the morning and at bedtime.  Allergic conjunctivitis of both eyes Will trial Pataday daily for relief of symptoms.  -     olopatadine (PATADAY) 0.1 % ophthalmic solution; Place 1 drop into both eyes 2 (two) times daily.  Morbid obesity ([place]) Labs pending. Diet and exercise encouraged.  -     Bayer DCA Hb A1c Waived -     CMP14+EGFR -     CBC with Differential/Platelet -     Lipid panel -     Thyroid Panel With TSH  Vitamin D deficiency On repletion therapy. Will recheck labs today and adjust regimen if warranted.  -     VITAMIN D 25 Hydroxy (Vit-D Deficiency, Fractures)  Essential hypertension Well controlled on current regimen. Continue. Labs pending. DASH diet and exercise encouraged.  -     Bayer DCA Hb A1c Waived -     CMP14+EGFR -     CBC with Differential/Platelet -     Lipid panel -     Thyroid Panel With TSH  Mixed hyperlipidemia Diet and exercise encouraged. Labs pending. Continue statin.  -     CMP14+EGFR -     Lipid panel  Encounter for screening mammogram for malignant neoplasm of breast -     MM 3D SCREEN BREAST BILATERAL; Future     Continue all other maintenance medications.  Follow up plan: Return in about 6 months (around 11/06/2021), or if symptoms worsen  or fail to improve.   Continue healthy lifestyle choices, including diet (rich in fruits, vegetables, and lean proteins, and low in salt and simple carbohydrates) and exercise (at least 30 minutes of moderate physical activity daily).   The above assessment and management plan was discussed with the patient. The patient verbalized understanding of and has agreed to the management plan. Patient is aware to call the clinic if they develop any new symptoms or if symptoms persist or worsen. Patient is aware when to return to the clinic for a follow-up visit. Patient educated on when it is appropriate to go to the emergency department.   Monia Pouch, FNP-C [facility] [phone]

## 2021-05-10 LAB — THYROID PANEL WITH TSH
Free Thyroxine Index: 1.8 (ref 1.2–4.9)
T3 Uptake Ratio: 26 % (ref 24–39)
T4, Total: 6.9 ug/dL (ref 4.5–12.0)
TSH: 1.55 u[IU]/mL (ref 0.450–4.500)

## 2021-05-10 LAB — CBC WITH DIFFERENTIAL/PLATELET
Basophils Absolute: 0.1 10*3/uL (ref 0.0–0.2)
Basos: 1 %
EOS (ABSOLUTE): 0.1 10*3/uL (ref 0.0–0.4)
Eos: 2 %
Hematocrit: 36.9 % (ref 34.0–46.6)
Hemoglobin: 11.9 g/dL (ref 11.1–15.9)
Immature Grans (Abs): 0 10*3/uL (ref 0.0–0.1)
Immature Granulocytes: 1 %
Lymphocytes Absolute: 2.5 10*3/uL (ref 0.7–3.1)
Lymphs: 38 %
MCH: 27.2 pg (ref 26.6–33.0)
MCHC: 32.2 g/dL (ref 31.5–35.7)
MCV: 84 fL (ref 79–97)
Monocytes Absolute: 0.6 10*3/uL (ref 0.1–0.9)
Monocytes: 8 %
Neutrophils Absolute: 3.4 10*3/uL (ref 1.4–7.0)
Neutrophils: 50 %
Platelets: 374 10*3/uL (ref 150–450)
RBC: 4.38 x10E6/uL (ref 3.77–5.28)
RDW: 12.8 % (ref 11.7–15.4)
WBC: 6.7 10*3/uL (ref 3.4–10.8)

## 2021-05-10 LAB — CMP14+EGFR
ALT: 16 IU/L (ref 0–32)
AST: 20 IU/L (ref 0–40)
Albumin/Globulin Ratio: 1.8 (ref 1.2–2.2)
Albumin: 4.2 g/dL (ref 3.8–4.9)
Alkaline Phosphatase: 107 IU/L (ref 44–121)
BUN/Creatinine Ratio: 19 (ref 9–23)
BUN: 16 mg/dL (ref 6–24)
Bilirubin Total: 0.3 mg/dL (ref 0.0–1.2)
CO2: 24 mmol/L (ref 20–29)
Calcium: 9.6 mg/dL (ref 8.7–10.2)
Chloride: 106 mmol/L (ref 96–106)
Creatinine, Ser: 0.83 mg/dL (ref 0.57–1.00)
Globulin, Total: 2.4 g/dL (ref 1.5–4.5)
Glucose: 104 mg/dL — ABNORMAL HIGH (ref 70–99)
Potassium: 3.5 mmol/L (ref 3.5–5.2)
Sodium: 144 mmol/L (ref 134–144)
Total Protein: 6.6 g/dL (ref 6.0–8.5)
eGFR: 82 mL/min/{1.73_m2} (ref >59)

## 2021-05-10 LAB — LIPID PANEL
Chol/HDL Ratio: 4.4 ratio (ref 0.0–4.4)
Cholesterol, Total: 169 mg/dL (ref 100–199)
HDL: 38 mg/dL — ABNORMAL LOW (ref >39)
LDL Chol Calc (NIH): 109 mg/dL — ABNORMAL HIGH (ref 0–99)
Triglycerides: 121 mg/dL (ref 0–149)
VLDL Cholesterol Cal: 22 mg/dL (ref 5–40)

## 2021-05-10 LAB — VITAMIN D 25 HYDROXY (VIT D DEFICIENCY, FRACTURES): Vit D, 25-Hydroxy: 44.3 ng/mL (ref 30.0–100.0)

## 2021-05-31 ENCOUNTER — Other Ambulatory Visit: Payer: Self-pay | Admitting: Family Medicine

## 2021-05-31 DIAGNOSIS — J410 Simple chronic bronchitis: Secondary | ICD-10-CM

## 2021-06-12 ENCOUNTER — Encounter: Payer: Self-pay | Admitting: Family Medicine

## 2021-06-12 ENCOUNTER — Telehealth: Payer: Self-pay | Admitting: Family Medicine

## 2021-06-12 ENCOUNTER — Ambulatory Visit (INDEPENDENT_AMBULATORY_CARE_PROVIDER_SITE_OTHER): Payer: BC Managed Care – PPO | Admitting: Family Medicine

## 2021-06-12 DIAGNOSIS — J301 Allergic rhinitis due to pollen: Secondary | ICD-10-CM

## 2021-06-12 DIAGNOSIS — J0100 Acute maxillary sinusitis, unspecified: Secondary | ICD-10-CM | POA: Diagnosis not present

## 2021-06-12 MED ORDER — PSEUDOEPHEDRINE-GUAIFENESIN ER 120-1200 MG PO TB12: 1.0000 | ORAL_TABLET | Freq: Two times a day (BID) | ORAL | 0 refills | Status: DC

## 2021-06-12 MED ORDER — AMOXICILLIN-POT CLAVULANATE 875-125 MG PO TABS: 1.0000 | ORAL_TABLET | Freq: Two times a day (BID) | ORAL | 0 refills | Status: DC

## 2021-06-12 NOTE — Telephone Encounter (Signed)
Patient asked that paperwork that was faxed to [facility] also be faxed to [employer] at [phone].  Paperwork faxed per patient request. ?

## 2021-06-12 NOTE — Progress Notes (Signed)
? ? ?Subjective:  ? ? Patient ID: Cynthia [name], female    DOB: 11/17/1965, 56 y.o.   MRN: [id number] ? ? ?HPI: ?Cynthia [name] is a 56 y.o. female presenting for Symptoms include congestion, facial pain, nasal congestion,  productive cough, post nasal drip and sinus pressure. Throat is sore. There is no fever, chills, or sweats. Onset of symptoms was a few days ago, gradually worsening since that time.  Occurs annually with flowers blooming ? ? ?Depression screen Cynthia [name] 2/9 05/09/2021 01/27/2021 01/17/2021 01/03/2021 08/02/2020  ?Decreased Interest 0 0 0 0 0  ?Down, Depressed, Hopeless 0 0 0 0 0  ?PHQ - 2 Score 0 0 0 0 0  ?Altered sleeping - 0 0 0 -  ?Tired, decreased energy - 0 0 0 -  ?Change in appetite - 0 0 0 -  ?Feeling bad or failure about yourself  - 0 0 0 -  ?Trouble concentrating - 0 0 0 -  ?Moving slowly or fidgety/restless - 0 0 0 -  ?Suicidal thoughts - 0 0 0 -  ?PHQ-9 Score - 0 0 0 -  ? ? ? ?Relevant past medical, surgical, family and social history reviewed and updated as indicated.  ?Interim medical history since our last visit reviewed. ?Allergies and medications reviewed and updated. ? ?ROS:  ?Review of Systems  ?Constitutional:  Negative for activity change, appetite change, chills and fever.  ?HENT:  Positive for congestion, postnasal drip, rhinorrhea and sinus pressure. Negative for ear discharge, ear pain, hearing loss, nosebleeds, sneezing and trouble swallowing.   ?Eyes:  Positive for discharge.  ?Respiratory:  Negative for chest tightness and shortness of breath.   ?Cardiovascular:  Negative for chest pain and palpitations.  ?Skin:  Negative for rash.   ? ?Social History  ? ?Tobacco Use  ?Smoking Status Former  ? Packs/day: 0.50  ? Years: 10.00  ? Pack years: 5.00  ? Types: Cigarettes  ? Quit date: 06/04/2016  ? Years since quitting: 5.0  ?Smokeless Tobacco Never  ? ? ?   ?Objective:  ?  ? ?Wt Readings from Last 3 Encounters:  ?05/09/21 233 lb (105.7 kg)  ?01/27/21 218 lb 9.6 oz (99.2 kg)   ?01/17/21 214 lb (97.1 kg)  ?  ? ?Exam deferred. Pt. Harbor[place] due to COVID 19. Phone visit performed.  ? ?Assessment & Plan:  ?No diagnosis found. ? ?Meds ordered this encounter  ?Medications  ? amoxicillin-clavulanate (AUGMENTIN) 875-125 MG tablet  ?  Sig: Take 1 tablet by mouth 2 (two) times daily. Take all of this medication  ?  Dispense:  20 tablet  ?  Refill:  0  ? Pseudoephedrine-Guaifenesin [phone] MG TB12  ?  Sig: Take 1 tablet by mouth 2 (two) times daily. For congestion  ?  Dispense:  20 tablet  ?  Refill:  0  ? ? ?No orders of the defined types were placed in this encounter. ? ?  ? ?There are no diagnoses linked to this encounter. ? ?Virtual Visit via telephone Note ? ?I discussed the limitations, risks, security and privacy concerns of performing an evaluation and management service by telephone and the availability of in person appointments. The patient was identified with two identifiers. Pt.expressed understanding and agreed to proceed. Pt. Is at home. Dr. Livia Snellen is in his office. ? ?Follow Up Instructions: ?  ?I discussed the assessment and treatment plan with the patient. The patient was provided an opportunity to ask questions and all were answered. The patient agreed  with the plan and demonstrated an understanding of the instructions. ?  ?The patient was advised to call back or seek an in-person evaluation if the symptoms worsen or if the condition fails to improve as anticipated. ? ? ?Total minutes including chart review and phone contact time: 8 ? ? ?Follow up plan: ?Return if symptoms worsen or fail to improve. ? ?Claretta Fraise, MD ?[place] ? ?

## 2021-06-14 ENCOUNTER — Other Ambulatory Visit: Payer: Self-pay

## 2021-06-14 ENCOUNTER — Ambulatory Visit
Admission: RE | Admit: 2021-06-14 | Discharge: 2021-06-14 | Disposition: A | Discharge status: Home / Self Care | Payer: BC Managed Care – PPO | Source: Ambulatory Visit | Attending: Family Medicine | Admitting: Family Medicine

## 2021-06-14 DIAGNOSIS — Z1231 Encounter for screening mammogram for malignant neoplasm of breast: Secondary | ICD-10-CM

## 2021-06-16 ENCOUNTER — Other Ambulatory Visit: Payer: Self-pay | Admitting: Family Medicine

## 2021-06-16 DIAGNOSIS — R928 Other abnormal and inconclusive findings on diagnostic imaging of breast: Secondary | ICD-10-CM

## 2021-06-20 ENCOUNTER — Ambulatory Visit (INDEPENDENT_AMBULATORY_CARE_PROVIDER_SITE_OTHER): Payer: BC Managed Care – PPO | Admitting: Family Medicine

## 2021-06-20 ENCOUNTER — Encounter: Payer: Self-pay | Admitting: Family Medicine

## 2021-06-20 VITALS — BP 165/95 | HR 100 | Temp 98.3°F | Ht 62.0 in | Wt 232.8 lb

## 2021-06-20 DIAGNOSIS — K921 Melena: Secondary | ICD-10-CM

## 2021-06-20 DIAGNOSIS — K644 Residual hemorrhoidal skin tags: Secondary | ICD-10-CM

## 2021-06-20 LAB — HEMOGLOBIN, FINGERSTICK: Hemoglobin: 11.8 g/dL (ref 11.1–15.9)

## 2021-06-20 MED ORDER — HYDROCORTISONE ACETATE 25 MG RE SUPP: 25.0000 mg | Freq: Two times a day (BID) | RECTAL | 0 refills | Status: DC

## 2021-06-20 MED ORDER — HYDROCORTISONE (PERIANAL) 2.5 % EX CREA: 1.0000 "application " | TOPICAL_CREAM | Freq: Two times a day (BID) | CUTANEOUS | 0 refills | Status: DC

## 2021-06-20 NOTE — Patient Instructions (Addendum)
Miralax 1 capful in 6-8 oz beverage of choice once daily as needed.  ? ?Preparation H with Tucks pads. ?

## 2021-06-20 NOTE — Progress Notes (Signed)
? ?Assessment & Plan:  ?1. External hemorrhoids ?Education provided on hemorrhoids Encouraged use of Preparation H, Tuck's pads, and Anusol. Miralax 1 capful in 6-8 oz beverage of choice once daily as needed for constipation.  ?- hydrocortisone (ANUSOL-HC) 25 MG suppository; Place 1 suppository (25 mg total) rectally 2 (two) times daily.  Dispense: 12 suppository; Refill: 0 ?- hydrocortisone (ANUSOL-HC) 2.5 % rectal cream; Place 1 application. rectally 2 (two) times daily.  Dispense: 30 g; Refill: 0 ? ?2. Frank blood in stool ?Patient is concerned about the possibility of colon cancer. Discussed if bleeping does not resolve with treatment of hemorrhoids she should see her gastroenterologist.  Hemoglobin stable at 11.8, which is mildly decreased from 11.9 two months ago. ?- Hemoglobin, fingerstick ? ? ?Follow up plan: Return if symptoms worsen or fail to improve. ? ?Hendricks Limes, MSN, APRN, FNP-C ?[place] ? ?Subjective:  ? ?Patient ID: Cynthia [name], female    DOB: 1965/11/10, 56 y.o.   MRN: [id number] ? ?HPI: ?Cynthia [name] is a 56 y.o. female presenting on 06/20/2021 for Rectal Bleeding (Patient states it has been going on a few weeks.  She thinks her Hemorid are external now. ) ? ?Patient is accompanied by her husband who she is okay with being present.  He did step out for the physical exam. ? ?Patient reports bleeding from her rectum that has been going on for a few weeks. She has had bleeding hemorrhoids in the past but feels like this is more blood that usual. She is seeing blood every day. It was only sometimes, but now she is seeing this every time she has a bowel movement. The blood is bright red in color. Home treatment includes an OTC hemorrhoid cream. Patient is sometimes lightheaded and shortness of breath. Denies tachycardia and palpitations.  ? ?Patient reports she has not had her blood pressure medication today. ? ? ?ROS: Negative unless specifically indicated  above in HPI.  ? ?Relevant past medical history reviewed and updated as indicated.  ? ?Allergies and medications reviewed and updated. ? ? ?Current Outpatient Medications:  ?  albuterol (VENTOLIN HFA) 108 (90 Base) MCG/ACT inhaler, INHALE 2 PUFFS INTO THE LUNGS EVERY 6 HOURS AS NEEDED FOR WHEEZE OR SHORTNESS OF BREATH, Disp: 8.5 each, Rfl: 4 ?  amLODipine (NORVASC) 10 MG tablet, TAKE 1 TABLET BY MOUTH DAILY, Disp: 90 tablet, Rfl: 3 ?  atorvastatin (LIPITOR) 20 MG tablet, TAKE 1 TABLET(20 MG) BY MOUTH DAILY, Disp: 90 tablet, Rfl: 3 ?  hydrochlorothiazide (HYDRODIURIL) 25 MG tablet, Take 25 mg by mouth daily., Disp: , Rfl:  ?  olopatadine (PATADAY) 0.1 % ophthalmic solution, Place 1 drop into both eyes 2 (two) times daily., Disp: 5 mL, Rfl: 12 ?  SYMBICORT 160-4.5 MCG/ACT inhaler, Inhale 2 puffs into the lungs in the morning and at bedtime., Disp: 1 each, Rfl: 12 ? ?No Known Allergies ? ?Objective:  ? ?BP (!) 165/95   Pulse 100   Temp 98.3 ?F (36.8 ?C)   Ht '5\' 2"'$  (1.575 m)   Wt 232 lb 12.8 oz (105.6 kg)   SpO2 90%   BMI 42.58 kg/m?   ? ?Physical Exam ?Vitals reviewed.  ?Constitutional:   ?   General: She is not in acute distress. ?   Appearance: Normal appearance. She is not ill-appearing, toxic-appearing or diaphoretic.  ?HENT:  ?   Head: Normocephalic and atraumatic.  ?Eyes:  ?   General: No scleral icterus.    ?  Right eye: No discharge.     ?   Left eye: No discharge.  ?   Conjunctiva/sclera: Conjunctivae normal.  ?Cardiovascular:  ?   Rate and Rhythm: Normal rate.  ?Pulmonary:  ?   Effort: Pulmonary effort is normal. No respiratory distress.  ?Genitourinary: ?   Rectum: External hemorrhoid present.  ?Musculoskeletal:     ?   General: Normal range of motion.  ?   Cervical back: Normal range of motion.  ?Skin: ?   General: Skin is warm and dry.  ?   Capillary Refill: Capillary refill takes less than 2 seconds.  ?Neurological:  ?   General: No focal deficit present.  ?   Mental Status: She is alert and  oriented to person, place, and time. Mental status is at baseline.  ?Psychiatric:     ?   Mood and Affect: Mood normal.     ?   Behavior: Behavior normal.     ?   Thought Content: Thought content normal.     ?   Judgment: Judgment normal.  ? ? ? ? ? ? ?

## 2021-07-06 ENCOUNTER — Ambulatory Visit
Admission: RE | Admit: 2021-07-06 | Discharge: 2021-07-06 | Disposition: A | Discharge status: Home / Self Care | Payer: BC Managed Care – PPO | Source: Ambulatory Visit | Attending: Family Medicine | Admitting: Family Medicine

## 2021-07-06 ENCOUNTER — Other Ambulatory Visit: Payer: Self-pay | Admitting: Family Medicine

## 2021-07-06 DIAGNOSIS — R928 Other abnormal and inconclusive findings on diagnostic imaging of breast: Secondary | ICD-10-CM

## 2021-07-06 DIAGNOSIS — N6323 Unspecified lump in the left breast, lower outer quadrant: Secondary | ICD-10-CM

## 2021-07-10 ENCOUNTER — Telehealth: Payer: Self-pay | Admitting: Family Medicine

## 2021-07-10 NOTE — Telephone Encounter (Signed)
Pt aware appt made

## 2021-07-13 ENCOUNTER — Encounter: Payer: Self-pay | Admitting: Family Medicine

## 2021-07-13 ENCOUNTER — Ambulatory Visit (INDEPENDENT_AMBULATORY_CARE_PROVIDER_SITE_OTHER): Payer: BC Managed Care – PPO | Admitting: Family Medicine

## 2021-07-13 VITALS — BP 125/74 | HR 98 | Temp 98.0°F | Ht 62.0 in | Wt 233.2 lb

## 2021-07-13 DIAGNOSIS — K644 Residual hemorrhoidal skin tags: Secondary | ICD-10-CM | POA: Diagnosis not present

## 2021-07-13 MED ORDER — POLYETHYLENE GLYCOL 3350 17 GM/SCOOP PO POWD: 17.0000 g | Freq: Every day | ORAL | 1 refills | Status: DC

## 2021-07-13 NOTE — Progress Notes (Signed)
? ?  ? ?Subjective:  ?Patient ID: Cynthia [name], female    DOB: 01-15-1966, 56 y.o.   MRN: [id number] ? ?Patient Care Team: ?Baruch Gouty, FNP as PCP - General (Family Medicine) ?Izora Gala, MD as Consulting Physician (Otolaryngology)  ? ?Chief Complaint:  Hemorrhoids ? ? ?HPI: ?Cynthia [name] is a 56 y.o. female presenting on 07/13/2021 for Hemorrhoids ? ? ?Pt presents today with complaints of ongoing hemorrhoids. She was seen in office and prescribed Anusol. Hgb was normal. She states the hemorrhoid is still present and that she does continue to have some bright red blood on tissue paper after wiping. She denies blood in stool or commode. She has not made follow up with GI. States she still has hard stools at times. No other associated symptoms.  ? ? ? ?Relevant past medical, surgical, family, and social history reviewed and updated as indicated.  ?Allergies and medications reviewed and updated. Data reviewed: Chart in Epic. ? ? ?Past Medical History:  ?Diagnosis Date  ? Allergy   ? High cholesterol   ? Hypertension   ? Migraine   ? Vertigo   ? ? ?Past Surgical History:  ?Procedure Laterality Date  ? ABDOMINAL HYSTERECTOMY    ? BREAST BIOPSY Left 2018  ? BREAST EXCISIONAL BIOPSY Left   ? BREAST SURGERY Left   ? lump removed   ? CESAREAN SECTION    ? x2  ? CYST REMOVAL LEG    ? FRACTURE SURGERY Right   ? Arm  ? TONSILLECTOMY    ? ? ?Social History  ? ?Socioeconomic History  ? Marital status: Married  ?  Spouse name: Not on file  ? Number of children: Not on file  ? Years of education: Not on file  ? Highest education level: Not on file  ?Occupational History  ? Not on file  ?Tobacco Use  ? Smoking status: Former  ?  Packs/day: 0.50  ?  Years: 10.00  ?  Pack years: 5.00  ?  Types: Cigarettes  ?  Quit date: 06/04/2016  ?  Years since quitting: 5.1  ? Smokeless tobacco: Never  ?Vaping Use  ? Vaping Use: Never used  ?Substance and Sexual Activity  ? Alcohol use: Yes  ?  Comment: occ  ? Drug use: No  ? Sexual  activity: Not Currently  ?  Partners: Male  ?  Comment: Married  ?Other Topics Concern  ? Not on file  ?Social History Narrative  ? Lives at home w/ her husband  ? Left-handed  ? Caffeine: 2-3 sodas per day  ? ?Social Determinants of Health  ? ?Financial Resource Strain: Not on file  ?Food Insecurity: Not on file  ?Transportation Needs: Not on file  ?Physical Activity: Not on file  ?Stress: Not on file  ?Social Connections: Not on file  ?Intimate Partner Violence: Not on file  ? ? ?Outpatient Encounter Medications as of 07/13/2021  ?Medication Sig  ? albuterol (VENTOLIN HFA) 108 (90 Base) MCG/ACT inhaler INHALE 2 PUFFS INTO THE LUNGS EVERY 6 HOURS AS NEEDED FOR WHEEZE OR SHORTNESS OF BREATH  ? amLODipine (NORVASC) 10 MG tablet TAKE 1 TABLET BY MOUTH DAILY  ? atorvastatin (LIPITOR) 20 MG tablet TAKE 1 TABLET(20 MG) BY MOUTH DAILY  ? hydrochlorothiazide (HYDRODIURIL) 25 MG tablet Take 25 mg by mouth daily.  ? hydrocortisone (ANUSOL-HC) 2.5 % rectal cream Place 1 application. rectally 2 (two) times daily.  ? hydrocortisone (ANUSOL-HC) 25 MG suppository Place 1 suppository (25 mg  total) rectally 2 (two) times daily.  ? olopatadine (PATADAY) 0.1 % ophthalmic solution Place 1 drop into both eyes 2 (two) times daily.  ? polyethylene glycol powder (GLYCOLAX/MIRALAX) 17 GM/SCOOP powder Take 17 g by mouth daily.  ? SYMBICORT 160-4.5 MCG/ACT inhaler Inhale 2 puffs into the lungs in the morning and at bedtime.  ? ?No facility-administered encounter medications on file as of 07/13/2021.  ? ? ?No Known Allergies ? ?Review of Systems  ?Constitutional:  Negative for activity change, appetite change, chills, diaphoresis, fatigue, fever and unexpected weight change.  ?HENT: Negative.    ?Eyes: Negative.   ?Respiratory:  Negative for cough, chest tightness and shortness of breath.   ?Cardiovascular:  Negative for chest pain, palpitations and leg swelling.  ?Gastrointestinal:  Positive for anal bleeding and rectal pain. Negative for  abdominal distention, abdominal pain, blood in stool, constipation, diarrhea, nausea and vomiting.  ?Endocrine: Negative.   ?Genitourinary:  Negative for decreased urine volume, difficulty urinating, dysuria, frequency and urgency.  ?Musculoskeletal:  Negative for arthralgias and myalgias.  ?Skin: Negative.   ?Allergic/Immunologic: Negative.   ?Neurological:  Negative for dizziness, tremors, seizures, syncope, facial asymmetry, speech difficulty, weakness, light-headedness, numbness and headaches.  ?Hematological: Negative.   ?Psychiatric/Behavioral:  Negative for confusion, hallucinations, sleep disturbance and suicidal ideas.   ?[name] other systems reviewed and are negative. ? ?   ? ?Objective:  ?BP 125/74   Pulse 98   Temp 98 ?F (36.7 ?C)   Ht '5\' 2"'$  (1.575 m)   Wt 233 lb 3.2 oz (105.8 kg)   SpO2 96%   BMI 42.65 kg/m?   ? ?Wt Readings from Last 3 Encounters:  ?07/13/21 233 lb 3.2 oz (105.8 kg)  ?06/20/21 232 lb 12.8 oz (105.6 kg)  ?05/09/21 233 lb (105.7 kg)  ? ? ?Physical Exam ?Vitals and nursing note reviewed.  ?Constitutional:   ?   General: She is not in acute distress. ?   Appearance: Normal appearance. She is obese. She is not ill-appearing, toxic-appearing or diaphoretic.  ?HENT:  ?   Head: Normocephalic and atraumatic.  ?Eyes:  ?   Pupils: Pupils are equal, round, and reactive to light.  ?Cardiovascular:  ?   Rate and Rhythm: Normal rate and regular rhythm.  ?   Heart sounds: Normal heart sounds.  ?Pulmonary:  ?   Effort: Pulmonary effort is normal.  ?   Breath sounds: Normal breath sounds.  ?Abdominal:  ?   General: Bowel sounds are normal.  ?   Palpations: Abdomen is soft.  ?   Tenderness: There is no abdominal tenderness.  ?Genitourinary: ?   Exam position: Knee-chest position.  ?   Rectum: Tenderness and external hemorrhoid present. No mass, anal fissure or internal hemorrhoid. Normal anal tone.  ?   Comments: No active bleeding, no dried blood ?Musculoskeletal:  ?   Right lower leg: No edema.  ?    Left lower leg: No edema.  ?Skin: ?   General: Skin is warm and dry.  ?   Capillary Refill: Capillary refill takes less than 2 seconds.  ?Neurological:  ?   General: No focal deficit present.  ?   Mental Status: She is alert and oriented to person, place, and time.  ?Psychiatric:     ?   Mood and Affect: Mood normal.     ?   Behavior: Behavior normal.     ?   Thought Content: Thought content normal.     ?   Judgment: Judgment normal.  ? ? ?  Results for orders placed or performed in visit on 06/20/21  ?Hemoglobin, fingerstick  ?Result Value Ref Range  ? Hemoglobin 11.8 11.1 - 15.9 g/dL  ? ?   ? ?Pertinent labs & imaging results that were available during my care of the patient were reviewed by me and considered in my medical decision making. ? ?Assessment & Plan:  ?Elaria was seen today for hemorrhoids. ? ?Diagnoses and [name] orders for this visit: ? ?External hemorrhoids ?Ongoing symptoms. Symptomatic care discussed in detail, will add Miralax to daily regimen to keep stools soft. Increase water intake, avoid straining and sitting on commode for long periods of time. New referral to GI placed.  ?-     Ambulatory referral to Gastroenterology ?-     polyethylene glycol powder (GLYCOLAX/MIRALAX) 17 GM/SCOOP powder; Take 17 g by mouth daily. ? ?  ? ?Continue [name] other maintenance medications. ? ?Follow up plan: ?Return if symptoms worsen or fail to improve. ? ? ?Continue healthy lifestyle choices, including diet (rich in fruits, vegetables, and lean proteins, and low in salt and simple carbohydrates) and exercise (at least 30 minutes of moderate physical activity daily). ? ?Educational handout given for hemorrhoids ? ?The above assessment and management plan was discussed with the patient. The patient verbalized understanding of and has agreed to the management plan. Patient is aware to call the clinic if they develop any new symptoms or if symptoms persist or worsen. Patient is aware when to return to the clinic for a  follow-up visit. Patient educated on when it is appropriate to go to the emergency department.  ? ?Monia Pouch, FNP-C ?[place] ?[phone] ? ? ?

## 2021-07-14 ENCOUNTER — Ambulatory Visit
Admission: RE | Admit: 2021-07-14 | Discharge: 2021-07-14 | Disposition: A | Discharge status: Home / Self Care | Payer: BC Managed Care – PPO | Source: Ambulatory Visit | Attending: Family Medicine | Admitting: Family Medicine

## 2021-07-14 DIAGNOSIS — N6323 Unspecified lump in the left breast, lower outer quadrant: Secondary | ICD-10-CM

## 2021-07-21 ENCOUNTER — Other Ambulatory Visit: Payer: BC Managed Care – PPO

## 2021-08-02 ENCOUNTER — Ambulatory Visit: Payer: BC Managed Care – PPO | Admitting: Nurse Practitioner

## 2021-08-07 ENCOUNTER — Other Ambulatory Visit: Payer: Self-pay | Admitting: Family Medicine

## 2021-08-07 DIAGNOSIS — E7800 Pure hypercholesterolemia, unspecified: Secondary | ICD-10-CM

## 2021-08-07 DIAGNOSIS — I10 Essential (primary) hypertension: Secondary | ICD-10-CM

## 2021-08-15 ENCOUNTER — Telehealth: Payer: Self-pay | Admitting: Family Medicine

## 2021-08-18 ENCOUNTER — Ambulatory Visit: Payer: BC Managed Care – PPO | Admitting: Family Medicine

## 2021-08-21 ENCOUNTER — Encounter: Payer: Self-pay | Admitting: Family Medicine

## 2021-08-21 ENCOUNTER — Ambulatory Visit (INDEPENDENT_AMBULATORY_CARE_PROVIDER_SITE_OTHER): Payer: BC Managed Care – PPO | Admitting: Family Medicine

## 2021-08-21 VITALS — BP 122/79 | HR 84 | Temp 97.8°F | Resp 20 | Ht 62.0 in | Wt 228.0 lb

## 2021-08-21 DIAGNOSIS — J301 Allergic rhinitis due to pollen: Secondary | ICD-10-CM

## 2021-08-21 DIAGNOSIS — I10 Essential (primary) hypertension: Secondary | ICD-10-CM | POA: Diagnosis not present

## 2021-08-21 DIAGNOSIS — H1031 Unspecified acute conjunctivitis, right eye: Secondary | ICD-10-CM | POA: Diagnosis not present

## 2021-08-21 DIAGNOSIS — J0101 Acute recurrent maxillary sinusitis: Secondary | ICD-10-CM

## 2021-08-21 MED ORDER — PSEUDOEPHEDRINE-GUAIFENESIN ER 120-1200 MG PO TB12: 1.0000 | ORAL_TABLET | Freq: Two times a day (BID) | ORAL | 0 refills | Status: AC

## 2021-08-21 MED ORDER — AMOXICILLIN-POT CLAVULANATE 875-125 MG PO TABS
1.0000 | ORAL_TABLET | Freq: Two times a day (BID) | ORAL | 0 refills | Status: DC
Start: 2021-08-21 — End: 2021-08-28

## 2021-08-21 MED ORDER — TOBRAMYCIN-DEXAMETHASONE 0.3-0.1 % OP SUSP: OPHTHALMIC | 0 refills | Status: DC

## 2021-08-21 MED ORDER — FEXOFENADINE-PSEUDOEPHED ER 180-240 MG PO TB24: 1.0000 | ORAL_TABLET | Freq: Every evening | ORAL | 3 refills | Status: DC

## 2021-08-21 NOTE — Progress Notes (Signed)
? ?Subjective:  ?Patient ID: Cynthia [name], female    DOB: [birth year]  Age: 56 y.o. MRN: [id number] ? ?CC: Eye Drainage (Right swelling and draining ) ? ? ?HPI ?Cynthia [name] presents for right eye drainage. Onset 2 weeks ago. Right ear and maxillary discomfort. Lots of past sinus problems. A lot of PND. Right ear feels full and right cheek has pain & pressure. No fever.  ? ?Patient has allergic rhinitis symptoms including sneezing frequently sniffling, clear rhinorrhea, watery and itchy eyes. There has been no fever no chills no sweats. No earaches. There is some scratchy throat but no sore throat or difficulty swallowing. There is some nasal congestion. ? ?BP has been controlled with amlodipine.  ? ? ? ?  08/21/2021  ?  8:08 AM 07/13/2021  ?  3:39 PM 06/20/2021  ?  2:36 PM  ?Depression screen PHQ 2/9  ?Decreased Interest 0 0 0  ?Down, Depressed, Hopeless 0 0 0  ?PHQ - 2 Score 0 0 0  ?Altered sleeping   0  ?Tired, decreased energy   0  ?Change in appetite   3  ?Feeling bad or failure about yourself    0  ?Trouble concentrating   0  ?Moving slowly or fidgety/restless   0  ?Suicidal thoughts   0  ?PHQ-9 Score   3  ?Difficult doing work/chores   Not difficult at all  ? ? ?History ?Cynthia [name] has a past medical history of Allergy, High cholesterol, Hypertension, Migraine, and Vertigo.  ? ?She has a past surgical history that includes Breast surgery (Left); Abdominal hysterectomy; Cyst removal leg; Tonsillectomy; Fracture surgery (Right); Breast excisional biopsy (Left); Cesarean section; and Breast biopsy (Left, 2018).  ? ?Her family history includes Cancer in her maternal aunt and maternal grandmother; Heart failure in her mother; Hypertension in her father; Kidney disease in her father.She reports that she quit smoking about 5 years ago. Her smoking use included cigarettes. She has a 5.00 pack-year smoking history. She has never used smokeless tobacco. She reports current alcohol use. She reports that she does not  use drugs. ? ? ? ?ROS ?Review of Systems  ?Constitutional:  Negative for activity change, appetite change, chills and fever.  ?HENT:  Positive for congestion, postnasal drip, rhinorrhea and sinus pressure. Negative for ear discharge, ear pain, hearing loss, nosebleeds, sneezing and trouble swallowing.   ?Respiratory:  Negative for chest tightness and shortness of breath.   ?Cardiovascular:  Negative for chest pain and palpitations.  ?Skin:  Negative for rash.  ? ?Objective:  ?BP 122/79   Pulse 84   Temp 97.8 ?F (36.6 ?C)   Resp 20   Ht '5\' 2"'$  (1.575 m)   Wt 228 lb (103.4 kg)   SpO2 98%   BMI 41.70 kg/m?  ? ?BP Readings from Last 3 Encounters:  ?08/21/21 122/79  ?07/13/21 125/74  ?06/20/21 (!) 165/95  ? ? ?Wt Readings from Last 3 Encounters:  ?08/21/21 228 lb (103.4 kg)  ?07/13/21 233 lb 3.2 oz (105.8 kg)  ?06/20/21 232 lb 12.8 oz (105.6 kg)  ? ? ? ?Physical Exam ?Constitutional:   ?   General: She is not in acute distress. ?   Appearance: She is well-developed.  ?HENT:  ?   Nose: Congestion present.  ?   Mouth/Throat:  ?   Mouth: Mucous membranes are moist.  ?Cardiovascular:  ?   Rate and Rhythm: Normal rate and regular rhythm.  ?Pulmonary:  ?   Breath sounds: Normal breath sounds.  ?  Musculoskeletal:     ?   General: Normal range of motion.  ?Skin: ?   General: Skin is warm and dry.  ?Neurological:  ?   Mental Status: She is alert and oriented to person, place, and time.  ? ? ? ? ?Assessment & Plan:  ? ?Cynthia [name] was seen today for eye drainage. ? ?Diagnoses and all orders for this visit: ? ?Acute bacterial conjunctivitis of right eye ? ?Essential hypertension ? ?Acute recurrent maxillary sinusitis ? ?Seasonal allergic rhinitis due to pollen ? ?Other orders ?-     Pseudoephedrine-Guaifenesin [phone] MG TB12; Take 1 tablet by mouth 2 (two) times daily for 6 days. For congestion ?-     amoxicillin-clavulanate (AUGMENTIN) 875-125 MG tablet; Take 1 tablet by mouth 2 (two) times daily. Take all of this medication ?-      tobramycin-dexamethasone (TOBRADEX) ophthalmic solution; Apply 1 drop in affected eye(s) every 2 hours for two days. Then every 4 hours for 5 days. ?-     fexofenadine-pseudoephedrine (ALLEGRA-D 24) 180-240 MG 24 hr tablet; Take 1 tablet by mouth every evening. For allergy and congestion maintenance ? ? ? ? ? ? ?I am having Cynthia [name] start on Pseudoephedrine-Guaifenesin, amoxicillin-clavulanate, tobramycin-dexamethasone, and fexofenadine-pseudoephedrine. I am also having her maintain her Symbicort, olopatadine, albuterol, hydrocortisone, hydrocortisone, polyethylene glycol powder, amLODipine, hydrochlorothiazide, and atorvastatin. ? ?Allergies as of 08/21/2021   ?No Known Allergies ?  ? ?  ?Medication List  ?  ? ?  ? Accurate as of Aug 21, 2021 11:02 AM. If you have any questions, ask your nurse or doctor.  ?  ?  ? ?  ? ?albuterol 108 (90 Base) MCG/ACT inhaler ?Commonly known as: VENTOLIN HFA ?INHALE 2 PUFFS INTO THE LUNGS EVERY 6 HOURS AS NEEDED FOR WHEEZE OR SHORTNESS OF BREATH ?  ?amLODipine 10 MG tablet ?Commonly known as: NORVASC ?TAKE 1 TABLET BY MOUTH ONCE DAILY ?  ?amoxicillin-clavulanate 875-125 MG tablet ?Commonly known as: AUGMENTIN ?Take 1 tablet by mouth 2 (two) times daily. Take all of this medication ?Started by: Claretta Fraise, MD ?  ?atorvastatin 20 MG tablet ?Commonly known as: LIPITOR ?TAKE 1 TABLET BY MOUTH ONCE DAILY ?  ?fexofenadine-pseudoephedrine 180-240 MG 24 hr tablet ?Commonly known as: ALLEGRA-D 24 ?Take 1 tablet by mouth every evening. For allergy and congestion maintenance ?Started by: Claretta Fraise, MD ?  ?hydrochlorothiazide 25 MG tablet ?Commonly known as: HYDRODIURIL ?TAKE 1 TABLET BY MOUTH DAILY ?  ?hydrocortisone 2.5 % rectal cream ?Commonly known as: Anusol-HC ?Place 1 application. rectally 2 (two) times daily. ?  ?hydrocortisone 25 MG suppository ?Commonly known as: ANUSOL-HC ?Place 1 suppository (25 mg total) rectally 2 (two) times daily. ?  ?olopatadine 0.1 %  ophthalmic solution ?Commonly known as: Pataday ?Place 1 drop into both eyes 2 (two) times daily. ?  ?polyethylene glycol powder 17 GM/SCOOP powder ?Commonly known as: GLYCOLAX/MIRALAX ?Take 17 g by mouth daily. ?  ?Pseudoephedrine-Guaifenesin [phone] MG Tb12 ?Take 1 tablet by mouth 2 (two) times daily for 6 days. For congestion ?Started by: Claretta Fraise, MD ?  ?Symbicort 160-4.5 MCG/ACT inhaler ?Generic drug: budesonide-formoterol ?Inhale 2 puffs into the lungs in the morning and at bedtime. ?  ?tobramycin-dexamethasone ophthalmic solution ?Commonly known as: TobraDex ?Apply 1 drop in affected eye(s) every 2 hours for two days. Then every 4 hours for 5 days. ?Started by: Claretta Fraise, MD ?  ? ?  ? ? ? ?Follow-up: Return if symptoms worsen or fail to improve. ? ?Claretta Fraise, M.D. ?

## 2021-08-28 ENCOUNTER — Encounter: Payer: Self-pay | Admitting: Family Medicine

## 2021-08-28 ENCOUNTER — Ambulatory Visit (INDEPENDENT_AMBULATORY_CARE_PROVIDER_SITE_OTHER): Payer: BC Managed Care – PPO | Admitting: Family Medicine

## 2021-08-28 ENCOUNTER — Ambulatory Visit (INDEPENDENT_AMBULATORY_CARE_PROVIDER_SITE_OTHER): Payer: BC Managed Care – PPO

## 2021-08-28 VITALS — BP 129/83 | HR 88 | Temp 97.3°F | Ht 62.0 in | Wt 228.0 lb

## 2021-08-28 DIAGNOSIS — J441 Chronic obstructive pulmonary disease with (acute) exacerbation: Secondary | ICD-10-CM

## 2021-08-28 MED ORDER — SYMBICORT 160-4.5 MCG/ACT IN AERO: 2.0000 | INHALATION_SPRAY | Freq: Two times a day (BID) | RESPIRATORY_TRACT | 12 refills | Status: DC

## 2021-08-28 MED ORDER — BENZONATATE 100 MG PO CAPS: 100.0000 mg | ORAL_CAPSULE | Freq: Three times a day (TID) | ORAL | 0 refills | Status: DC | PRN

## 2021-08-28 MED ORDER — PREDNISONE 20 MG PO TABS: ORAL_TABLET | ORAL | 0 refills | Status: DC

## 2021-08-28 MED ORDER — DOXYCYCLINE HYCLATE 100 MG PO TABS: 100.0000 mg | ORAL_TABLET | Freq: Two times a day (BID) | ORAL | 0 refills | Status: DC

## 2021-08-28 MED ORDER — LEVALBUTEROL TARTRATE 45 MCG/ACT IN AERO: 2.0000 | INHALATION_SPRAY | Freq: Four times a day (QID) | RESPIRATORY_TRACT | 12 refills | Status: DC | PRN

## 2021-08-28 NOTE — Progress Notes (Signed)
Subjective: [id number] PCP: Baruch Gouty, FNP [id number] Cynthia [name] is a 56 y.o. female presenting to clinic today for:  1. Sinusitis Was seen on 08/21/2021 and empirically treated for both bacterial conjunctivitis as well as bacterial sinusitis.  She was prescribed Allegra-D, Augmentin 875 twice daily for 10 days on the 15th.   She reports ongoing productive cough that has started becoming more clear.  However, she feels like she is wheezing and coughing nonstop.  She has not utilized any of her inhalers because it tends to make her feel jittery.  This includes her Symbicort.  Over-the-counter products are not totally controlling her symptoms.  She has been using NyQuil and other over-the-counter products.  She does admit that she had somebody that tested positive for COVID at work a few weeks ago and that she has lost smell and taste but this is not atypical for her normal infections.  She has had questionable fevers at home but no nausea, vomiting or diarrhea.  No hemoptysis.   ROS: Per HPI  No Known Allergies Past Medical History:  Diagnosis Date   Allergy    High cholesterol    Hypertension    Migraine    Vertigo     Current Outpatient Medications:    albuterol (VENTOLIN HFA) 108 (90 Base) MCG/ACT inhaler, INHALE 2 PUFFS INTO THE LUNGS EVERY 6 HOURS AS NEEDED FOR WHEEZE OR SHORTNESS OF BREATH, Disp: 8.5 each, Rfl: 4   amLODipine (NORVASC) 10 MG tablet, TAKE 1 TABLET BY MOUTH ONCE DAILY, Disp: 90 tablet, Rfl: 0   amoxicillin-clavulanate (AUGMENTIN) 875-125 MG tablet, Take 1 tablet by mouth 2 (two) times daily. Take all of this medication, Disp: 20 tablet, Rfl: 0   atorvastatin (LIPITOR) 20 MG tablet, TAKE 1 TABLET BY MOUTH ONCE DAILY, Disp: 90 tablet, Rfl: 0   fexofenadine-pseudoephedrine (ALLEGRA-D 24) 180-240 MG 24 hr tablet, Take 1 tablet by mouth every evening. For allergy and congestion maintenance, Disp: 90 tablet, Rfl: 3   hydrochlorothiazide (HYDRODIURIL) 25 MG  tablet, TAKE 1 TABLET BY MOUTH DAILY, Disp: 90 tablet, Rfl: 0   hydrocortisone (ANUSOL-HC) 2.5 % rectal cream, Place 1 application. rectally 2 (two) times daily., Disp: 30 g, Rfl: 0   hydrocortisone (ANUSOL-HC) 25 MG suppository, Place 1 suppository (25 mg total) rectally 2 (two) times daily., Disp: 12 suppository, Rfl: 0   polyethylene glycol powder (GLYCOLAX/MIRALAX) 17 GM/SCOOP powder, Take 17 g by mouth daily., Disp: 3350 g, Rfl: 1   SYMBICORT 160-4.5 MCG/ACT inhaler, Inhale 2 puffs into the lungs in the morning and at bedtime., Disp: 1 each, Rfl: 12   tobramycin-dexamethasone (TOBRADEX) ophthalmic solution, Apply 1 drop in affected eye(s) every 2 hours for two days. Then every 4 hours for 5 days., Disp: 5 mL, Rfl: 0   olopatadine (PATADAY) 0.1 % ophthalmic solution, Place 1 drop into both eyes 2 (two) times daily. (Patient not taking: Reported on 08/21/2021), Disp: 5 mL, Rfl: 12 Social History   Socioeconomic History   Marital status: Married    Spouse name: Not on file   Number of children: Not on file   Years of education: Not on file   Highest education level: Not on file  Occupational History   Not on file  Tobacco Use   Smoking status: Former    Packs/day: 0.50    Years: 10.00    Pack years: 5.00    Types: Cigarettes    Quit date: 06/04/2016    Years since quitting: 5.2   Smokeless  tobacco: Never  Vaping Use   Vaping Use: Never used  Substance and Sexual Activity   Alcohol use: Yes    Comment: occ   Drug use: No   Sexual activity: Not Currently    Partners: Male    Comment: Married  Other Topics Concern   Not on file  Social History Narrative   Lives at home w/ her husband   Left-handed   Caffeine: 2-3 sodas per day   Social Determinants of Health   Financial Resource Strain: Not on file  Food Insecurity: Not on file  Transportation Needs: Not on file  Physical Activity: Not on file  Stress: Not on file  Social Connections: Not on file  Intimate Partner  Violence: Not on file   Family History  Problem Relation Age of Onset   Heart failure Mother    Hypertension Father    Kidney disease Father    Cancer Maternal Grandmother    Cancer Maternal Aunt    Colon cancer Neg Hx    Esophageal cancer Neg Hx    Rectal cancer Neg Hx    Stomach cancer Neg Hx     Objective: Office vital signs reviewed. BP 129/83   Pulse 88   Temp (!) 97.3 F (36.3 C)   Ht '5\' 2"'$  (1.575 m)   Wt 228 lb (103.4 kg)   SpO2 100%   BMI 41.70 kg/m   Physical Examination:  General: Awake, alert, nontoxic female, No acute distress HEENT: Sclera white Cardio: regular rate and rhythm, S1S2 heard, no murmurs appreciated Pulm: Global expiratory wheezes both anterior and posteriorly with worsening in bilateral bases.  She is coughing intermittently during exam.  Normal work of breathing on room air  DG Chest 2 View  Result Date: 08/28/2021 CLINICAL DATA:  A 56 year old female presents for evaluation of cough and shortness of breath. EXAM: CHEST - 2 VIEW COMPARISON:  January 27, 2021. FINDINGS: Subtle basilar opacities. Nodular areas of added density project over the spine on lateral projection. No effusion. No lobar consolidation. Cardiomediastinal contours and hilar structures are normal. On limited assessment no acute skeletal process. IMPRESSION: Subtle basilar opacities and added density over the spine may reflect early pneumonia, difficult to localize on AP projection. Consider follow-up PA and lateral chest in 6-8 weeks after resolution of symptoms to ensure clearing given the mildly nodular appearance on lateral projection. Electronically Signed   By: Zetta Bills M.D.   On: 08/28/2021 09:18     Assessment/ Plan: 56 y.o. female   COPD exacerbation ([place]) - Plan: Novel Coronavirus, NAA ([facility]), predniSONE (DELTASONE) 20 MG tablet, doxycycline (VIBRA-TABS) 100 MG tablet, levalbuterol (XOPENEX HFA) 45 MCG/ACT inhaler, SYMBICORT 160-4.5 MCG/ACT inhaler, DG Chest 2  View, benzonatate (TESSALON PERLES) 100 MG capsule  Suspect COPD exacerbation.  Given exposure to COVID virus I am going to collect a COVID test that she is well outside of the interval for treatment at this point.  Prednisone burst prescribed given history of COPD, doxycycline for coverage of lungs and I have transitioned her from albuterol to Xopenex in hopes that this would cause less tachycardia in this patient who most certainly would benefit from a rapid inhaler right now.  Tessalon Perles sent for cough suppressant.  X-ray does demonstrate something that may be reflective of an early pneumonia but hopefully doxycycline will cover anything that may be trying to manifest.  She is to follow-up with PCP in the next 4 weeks for repeat chest x-ray  No orders of  the defined types were placed in this encounter.  No orders of the defined types were placed in this encounter.    [name], DO [place] [phone]

## 2021-08-28 NOTE — Patient Instructions (Signed)
START Xopenex inhaler and use every 6 hours AS needed for wheezing, shortness of breath STOP Amoxicillin 875 START Doxycycline with food twice daily START prednisone with breakfast Symbicort sent to pharmacy.  It says covered by your insurance.  If it is still expensive, call and ask for an appointment with Almyra Free, our pharmacist.  She can sometimes get this for free for patients. You had a chest xray today. We will call with results.

## 2021-08-29 LAB — NOVEL CORONAVIRUS, NAA: SARS-CoV-2, NAA: NOT DETECTED

## 2021-09-01 ENCOUNTER — Telehealth: Payer: Self-pay | Admitting: Family Medicine

## 2021-09-01 NOTE — Telephone Encounter (Signed)
She is better now   Congestion was green - now clear .  She is feeling better - using all meds as rx'd   Aware to drink plenty of water and call if things change for the worse

## 2021-09-05 ENCOUNTER — Ambulatory Visit: Payer: BC Managed Care – PPO | Admitting: Family Medicine

## 2021-09-05 ENCOUNTER — Encounter: Payer: Self-pay | Admitting: Family Medicine

## 2021-09-05 VITALS — BP 117/65 | HR 89 | Temp 98.8°F | Ht 62.0 in | Wt 228.0 lb

## 2021-09-05 DIAGNOSIS — J329 Chronic sinusitis, unspecified: Secondary | ICD-10-CM | POA: Diagnosis not present

## 2021-09-05 DIAGNOSIS — J410 Simple chronic bronchitis: Secondary | ICD-10-CM

## 2021-09-05 NOTE — Progress Notes (Signed)
Subjective:  Patient ID: Cynthia [name], female    DOB: [birth year], 56 y.o.   MRN: [id number]  Patient Care Team: Baruch Gouty, FNP as PCP - General (Family Medicine) Izora Gala, MD as Consulting Physician (Otolaryngology)   Chief Complaint:  Sinus Problem (Ongoing symptoms)   HPI: Cynthia [name] is a 56 y.o. female presenting on 09/05/2021 for Sinus Problem (Ongoing symptoms)   Sinus Problem This is a chronic problem. The current episode started more than 1 year ago. The problem has been waxing and waning since onset. There has been no fever. She is experiencing no pain. Associated symptoms include coughing and sinus pressure. Pertinent negatives include no chills, congestion, diaphoresis, ear pain, headaches, hoarse voice, neck pain, shortness of breath, sneezing, sore throat or swollen glands.  Cough This is a recurrent problem. The current episode started more than 1 month ago. The problem has been rapidly improving. The cough is Non-productive. Pertinent negatives include no chest pain, chills, ear congestion, ear pain, fever, headaches, heartburn, hemoptysis, myalgias, nasal congestion, postnasal drip, rash, rhinorrhea, sore throat, shortness of breath, sweats, weight loss or wheezing. Treatments tried: has not been using inhalers as prescribed. Her past medical history is significant for bronchitis and COPD.    Relevant past medical, surgical, family, and social history reviewed and updated as indicated.  Allergies and medications reviewed and updated. Data reviewed: Chart in Epic.   Past Medical History:  Diagnosis Date   Allergy    High cholesterol    Hypertension    Migraine    Vertigo     Past Surgical History:  Procedure Laterality Date   ABDOMINAL HYSTERECTOMY     BREAST BIOPSY Left 2018   BREAST EXCISIONAL BIOPSY Left    BREAST SURGERY Left    lump removed    CESAREAN SECTION     x2   CYST REMOVAL LEG     FRACTURE SURGERY Right    Arm    TONSILLECTOMY      Social History   Socioeconomic History   Marital status: Married    Spouse name: Not on file   Number of children: Not on file   Years of education: Not on file   Highest education level: Not on file  Occupational History   Not on file  Tobacco Use   Smoking status: Former    Packs/day: 0.50    Years: 10.00    Pack years: 5.00    Types: Cigarettes    Quit date: 06/04/2016    Years since quitting: 5.2   Smokeless tobacco: Never  Vaping Use   Vaping Use: Never used  Substance and Sexual Activity   Alcohol use: Yes    Comment: occ   Drug use: No   Sexual activity: Not Currently    Partners: Male    Comment: Married  Other Topics Concern   Not on file  Social History Narrative   Lives at home w/ her husband   Left-handed   Caffeine: 2-3 sodas per day   Social Determinants of Health   Financial Resource Strain: Not on file  Food Insecurity: Not on file  Transportation Needs: Not on file  Physical Activity: Not on file  Stress: Not on file  Social Connections: Not on file  Intimate Partner Violence: Not on file    Outpatient Encounter Medications as of 09/05/2021  Medication Sig   amLODipine (NORVASC) 10 MG tablet TAKE 1 TABLET BY MOUTH ONCE DAILY   atorvastatin (LIPITOR)  20 MG tablet TAKE 1 TABLET BY MOUTH ONCE DAILY   benzonatate (TESSALON PERLES) 100 MG capsule Take 1 capsule (100 mg total) by mouth 3 (three) times daily as needed.   fexofenadine-pseudoephedrine (ALLEGRA-D 24) 180-240 MG 24 hr tablet Take 1 tablet by mouth every evening. For allergy and congestion maintenance   hydrochlorothiazide (HYDRODIURIL) 25 MG tablet TAKE 1 TABLET BY MOUTH DAILY   hydrocortisone (ANUSOL-HC) 2.5 % rectal cream Place 1 application. rectally 2 (two) times daily.   hydrocortisone (ANUSOL-HC) 25 MG suppository Place 1 suppository (25 mg total) rectally 2 (two) times daily.   levalbuterol (XOPENEX HFA) 45 MCG/ACT inhaler Inhale 2 puffs into the lungs every 6  (six) hours as needed for wheezing or shortness of breath (to REPLACE albuterol).   olopatadine (PATADAY) 0.1 % ophthalmic solution Place 1 drop into both eyes 2 (two) times daily.   polyethylene glycol powder (GLYCOLAX/MIRALAX) 17 GM/SCOOP powder Take 17 g by mouth daily.   SYMBICORT 160-4.5 MCG/ACT inhaler Inhale 2 puffs into the lungs in the morning and at bedtime.   tobramycin-dexamethasone (TOBRADEX) ophthalmic solution Apply 1 drop in affected eye(s) every 2 hours for two days. Then every 4 hours for 5 days.   [DISCONTINUED] doxycycline (VIBRA-TABS) 100 MG tablet Take 1 tablet (100 mg total) by mouth 2 (two) times daily.   [DISCONTINUED] predniSONE (DELTASONE) 20 MG tablet 2 po at same time daily for 5 days   No facility-administered encounter medications on file as of 09/05/2021.    No Known Allergies  Review of Systems  Constitutional:  Negative for activity change, appetite change, chills, diaphoresis, fatigue, fever, unexpected weight change and weight loss.  HENT:  Positive for sinus pressure. Negative for congestion, ear pain, hoarse voice, postnasal drip, rhinorrhea, sinus pain, sneezing and sore throat.   Respiratory:  Positive for cough. Negative for apnea, hemoptysis, choking, chest tightness, shortness of breath, wheezing and stridor.   Cardiovascular:  Negative for chest pain, palpitations and leg swelling.  Gastrointestinal:  Negative for abdominal pain and heartburn.  Genitourinary:  Negative for decreased urine volume and difficulty urinating.  Musculoskeletal:  Negative for myalgias and neck pain.  Skin:  Negative for rash.  Neurological:  Negative for dizziness, weakness and headaches.  Psychiatric/Behavioral:  Negative for confusion.   All other systems reviewed and are negative.      Objective:  BP 117/65   Pulse 89   Temp 98.8 F (37.1 C)   Ht '5\' 2"'$  (1.575 m)   Wt 228 lb (103.4 kg)   SpO2 94%   BMI 41.70 kg/m    Wt Readings from Last 3 Encounters:   09/05/21 228 lb (103.4 kg)  08/28/21 228 lb (103.4 kg)  08/21/21 228 lb (103.4 kg)    Physical Exam Vitals and nursing note reviewed.  Constitutional:      General: She is not in acute distress.    Appearance: Normal appearance. She is obese. She is not ill-appearing, toxic-appearing or diaphoretic.  HENT:     Head: Normocephalic and atraumatic.     Mouth/Throat:     Mouth: Mucous membranes are moist.     Pharynx: Oropharynx is clear.  Eyes:     Pupils: Pupils are equal, round, and reactive to light.  Cardiovascular:     Rate and Rhythm: Normal rate and regular rhythm.     Heart sounds: Normal heart sounds. No murmur heard.   No friction rub. No gallop.  Pulmonary:     Effort: Pulmonary effort is normal.  Breath sounds: Normal breath sounds. No wheezing, rhonchi or rales.  Musculoskeletal:     Cervical back: Neck supple.     Right lower leg: No edema.     Left lower leg: No edema.  Skin:    General: Skin is warm and dry.     Capillary Refill: Capillary refill takes less than 2 seconds.  Neurological:     General: No focal deficit present.     Mental Status: She is alert and oriented to person, place, and time.  Psychiatric:        Mood and Affect: Mood normal.        Behavior: Behavior normal.        Thought Content: Thought content normal.        Judgment: Judgment normal.    Results for orders placed or performed in visit on 08/28/21  Novel Coronavirus, NAA ([facility])   Specimen: Nasopharyngeal(NP) swabs in vial transport medium  Result Value Ref Range   SARS-CoV-2, NAA Not Detected Not Detected       Pertinent labs & imaging results that were available during my care of the patient were reviewed by me and considered in my medical decision making.  Assessment & Plan:  Aleeya was seen today for sinus problem.  Diagnoses and all orders for this visit:  Simple chronic bronchitis ([place]) Improving greatly but is still not using inhalers, especially Symbicort,  as prescribed. Aware she need to use inhalers as prescribed for symptom management. Has not seen pulmonology, will place referral today.  -     Ambulatory referral to Pulmonology  Recurrent sinusitis Ongoing for several months. Has not see ENT, will place referral today.  -     Ambulatory referral to ENT     Continue all other maintenance medications.  Follow up plan: Return if symptoms worsen or fail to improve.   Continue healthy lifestyle choices, including diet (rich in fruits, vegetables, and lean proteins, and low in salt and simple carbohydrates) and exercise (at least 30 minutes of moderate physical activity daily).  Educational handout given for URI, bronchitis  The above assessment and management plan was discussed with the patient. The patient verbalized understanding of and has agreed to the management plan. Patient is aware to call the clinic if they develop any new symptoms or if symptoms persist or worsen. Patient is aware when to return to the clinic for a follow-up visit. Patient educated on when it is appropriate to go to the emergency department.   Monia Pouch, FNP-C [facility] [phone]

## 2021-09-11 ENCOUNTER — Ambulatory Visit: Payer: BC Managed Care – PPO | Admitting: Nurse Practitioner

## 2021-09-11 ENCOUNTER — Encounter: Payer: Self-pay | Admitting: Nurse Practitioner

## 2021-09-11 VITALS — BP 115/73 | HR 99 | Temp 97.2°F | Resp 20 | Ht 62.0 in | Wt 225.0 lb

## 2021-09-11 DIAGNOSIS — J40 Bronchitis, not specified as acute or chronic: Secondary | ICD-10-CM | POA: Diagnosis not present

## 2021-09-11 MED ORDER — FLUTICASONE-SALMETEROL 100-50 MCG/ACT IN AEPB: 1.0000 | INHALATION_SPRAY | Freq: Two times a day (BID) | RESPIRATORY_TRACT | 3 refills | Status: DC

## 2021-09-11 NOTE — Progress Notes (Signed)
   Subjective:    Patient ID: Cynthia [name], female    DOB: 09-Mar-1966, 56 y.o.   MRN: [id number]   Chief Complaint: Cough   Cough This is a new problem. The current episode started 1 to 4 weeks ago. The problem has been waxing and waning. The cough is Productive of purulent sputum. Pertinent negatives include no chills, fever, sore throat or shortness of breath. Nothing aggravates the symptoms. She has tried ipratropium inhaler for the symptoms. The treatment provided mild relief. She had a really bad night last night. She has been in the last 3 mondays with the same cough. She has already been referred to pulmonology and has an appointment  on 10/02/21. Chest xray was negative. She has had steroids and cough meds. Was prescribed symbicort inhaler but was to expensive to purchase.   Review of Systems  Constitutional:  Negative for chills and fever.  HENT:  Negative for sore throat.   Respiratory:  Positive for cough. Negative for shortness of breath.       Objective:   Physical Exam Constitutional:      Appearance: Normal appearance. She is obese.  Cardiovascular:     Rate and Rhythm: Normal rate and regular rhythm.     Heart sounds: Normal heart sounds.  Pulmonary:     Effort: Pulmonary effort is normal.     Breath sounds: Normal breath sounds.  Skin:    General: Skin is warm.  Neurological:     General: No focal deficit present.     Mental Status: She is alert and oriented to person, place, and time.  Psychiatric:        Mood and Affect: Mood normal.        Behavior: Behavior normal.   BP 115/73   Pulse 99   Temp (!) 97.2 F (36.2 C) (Temporal)   Resp 20   Ht '5\' 2"'$  (1.575 m)   Wt 225 lb (102.1 kg)   SpO2 95%   BMI 41.15 kg/m         Assessment & Plan:  BRIUNNA LEICHT in today with chief complaint of Cough   1. Bronchitis Changed from symbicort t o advair due to insurance- use discussed Keep follow up with pulmonology Take allergy meds daily -  fluticasone-salmeterol (ADVAIR) 100-50 MCG/ACT AEPB; Inhale 1 puff into the lungs 2 (two) times daily.  Dispense: 1 each; Refill: 3    The above assessment and management plan was discussed with the patient. The patient verbalized understanding of and has agreed to the management plan. Patient is aware to call the clinic if symptoms persist or worsen. Patient is aware when to return to the clinic for a follow-up visit. Patient educated on when it is appropriate to go to the emergency department.   Mary-Margaret Hassell Done, FNP

## 2021-09-11 NOTE — Patient Instructions (Signed)
  Acute Bronchitis, Adult  Acute bronchitis is when air tubes in the lungs (bronchi) suddenly get swollen. The condition can make it hard for you to breathe. In adults, acute bronchitis usually goes away within 2 weeks. A cough caused by bronchitis may last up to 3 weeks. Smoking, allergies, and asthma can make the condition worse. What are the causes? Germs that cause cold and flu (viruses). The most common cause of this condition is the virus that causes the common cold. Bacteria. Substances that bother (irritate) the lungs, including: Smoke from cigarettes and other types of tobacco. Dust and pollen. Fumes from chemicals, gases, or burned fuel. Indoor or outdoor air pollution. What increases the risk? A weak body's defense system. This is also called the immune system. Any condition that affects your lungs and breathing, such as asthma. What are the signs or symptoms? A cough. Coughing up clear, yellow, or green mucus. Making high-pitched whistling sounds when you breathe, most often when you breathe out (wheezing). Runny or stuffy nose. Having too much mucus in your lungs (chest congestion). Shortness of breath. Body aches. A sore throat. How is this treated? Acute bronchitis may go away over time without treatment. Your doctor may tell you to: Drink more fluids. This will help thin your mucus so it is easier to cough up. Use a device that gets medicine into your lungs (inhaler). Use a vaporizer or a humidifier. These are machines that add water to the air. This helps with coughing and poor breathing. Take a medicine that thins mucus and helps clear it from your lungs. Take a medicine that prevents or stops coughing. It is not common to take an antibiotic medicine for this condition. Follow these instructions at home:  Take over-the-counter and prescription medicines only as told by your doctor. Use an inhaler, vaporizer, or humidifier as told by your doctor. Take two  teaspoons (10 mL) of honey at bedtime. This helps lessen your coughing at night. Drink enough fluid to keep your pee (urine) pale yellow. Do not smoke or use any products that contain nicotine or tobacco. If you need help quitting, ask your doctor. Get a lot of rest. Return to your normal activities when your doctor says that it is safe. Keep all follow-up visits. How is this prevented?  Wash your hands often with soap and water for at least 20 seconds. If you cannot use soap and water, use hand sanitizer. Avoid contact with people who have cold symptoms. Try not to touch your mouth, nose, or eyes with your hands. Avoid breathing in smoke or chemical fumes. Make sure to get the flu shot every year. Contact a doctor if: Your symptoms do not get better in 2 weeks. You have trouble coughing up the mucus. Your cough keeps you awake at night. You have a fever. Get help right away if: You cough up blood. You have chest pain. You have very bad shortness of breath. You faint or keep feeling like you are going to faint. You have a very bad headache. Your fever or chills get worse. These symptoms may be an emergency. Get help right away. Call your local emergency services (911 in the [place] Do not wait to see if the symptoms will go away. Do not drive yourself to the hospital. Summary Acute bronchitis is when air tubes in the lungs (bronchi) suddenly get swollen. In adults, acute bronchitis usually goes away within 2 weeks. Drink more fluids. This will help thin your mucus so it   is easier to cough up. Take over-the-counter and prescription medicines only as told by your doctor. Contact a doctor if your symptoms do not improve after 2 weeks of treatment. This information is not intended to replace advice given to you by your health care provider. Make sure you discuss any questions you have with your health care provider. Document Revised: 07/27/2020 Document Reviewed: 07/27/2020 Elsevier  Patient Education  2023 [employer]vier Inc.  

## 2021-10-02 ENCOUNTER — Encounter: Payer: Self-pay | Admitting: Pulmonary Disease

## 2021-10-02 ENCOUNTER — Ambulatory Visit (INDEPENDENT_AMBULATORY_CARE_PROVIDER_SITE_OTHER): Payer: BC Managed Care – PPO | Admitting: Pulmonary Disease

## 2021-10-02 ENCOUNTER — Ambulatory Visit (INDEPENDENT_AMBULATORY_CARE_PROVIDER_SITE_OTHER): Payer: BC Managed Care – PPO

## 2021-10-02 VITALS — BP 132/84 | HR 87 | Temp 97.8°F | Ht 63.0 in | Wt 225.8 lb

## 2021-10-02 DIAGNOSIS — R0602 Shortness of breath: Secondary | ICD-10-CM

## 2021-10-02 DIAGNOSIS — J410 Simple chronic bronchitis: Secondary | ICD-10-CM

## 2021-10-02 DIAGNOSIS — J4531 Mild persistent asthma with (acute) exacerbation: Secondary | ICD-10-CM | POA: Diagnosis not present

## 2021-10-04 ENCOUNTER — Telehealth: Payer: Self-pay | Admitting: Pharmacy Technician

## 2021-10-04 NOTE — Telephone Encounter (Signed)
Please run Symbicort and generic benefits investigation. [employer] has ended patient assistance for Symbicort.

## 2021-10-04 NOTE — Telephone Encounter (Signed)
-----   Message from Marshell Garfinkel, MD sent at 10/02/2021  9:52 AM EDT ----- Patient is on advair and does not like it.  She would like to go back on Symbicort.  Can you please review her coverage options and patient assistance options.  Thank you.

## 2021-10-05 ENCOUNTER — Other Ambulatory Visit (HOSPITAL_COMMUNITY): Payer: Self-pay

## 2021-10-09 NOTE — Telephone Encounter (Signed)
Called pt and there was no answer-LMTCB °

## 2021-10-09 NOTE — Telephone Encounter (Signed)
Please let her know that per pharmacy the symbicort copay is $93.91

## 2021-10-11 ENCOUNTER — Encounter: Payer: Self-pay | Admitting: *Deleted

## 2021-10-11 NOTE — Telephone Encounter (Signed)
Tried calling the pt, someone answered but did not speak and then hung up the phone. Will mail letter.

## 2021-10-11 NOTE — Telephone Encounter (Signed)
I called the patient and let her know that the co-pay amount and she abruptly hung the phone up. Nothing further needed.

## 2021-10-12 ENCOUNTER — Ambulatory Visit: Payer: BC Managed Care – PPO | Admitting: Family Medicine

## 2021-10-24 ENCOUNTER — Encounter: Payer: Self-pay | Admitting: *Deleted

## 2021-11-05 ENCOUNTER — Other Ambulatory Visit: Payer: Self-pay | Admitting: Family Medicine

## 2021-11-05 DIAGNOSIS — E7800 Pure hypercholesterolemia, unspecified: Secondary | ICD-10-CM

## 2021-11-07 ENCOUNTER — Ambulatory Visit: Payer: BC Managed Care – PPO | Admitting: Family Medicine

## 2021-11-09 ENCOUNTER — Other Ambulatory Visit: Payer: Self-pay | Admitting: Family Medicine

## 2021-11-09 DIAGNOSIS — I10 Essential (primary) hypertension: Secondary | ICD-10-CM

## 2021-11-20 ENCOUNTER — Encounter: Payer: Self-pay | Admitting: Family

## 2021-11-20 ENCOUNTER — Ambulatory Visit: Payer: BC Managed Care – PPO | Admitting: Family

## 2021-11-20 ENCOUNTER — Encounter: Payer: Self-pay | Admitting: Family Medicine

## 2021-11-20 VITALS — BP 122/77 | HR 82 | Temp 98.0°F | Ht 63.0 in | Wt 222.2 lb

## 2021-11-20 DIAGNOSIS — J4531 Mild persistent asthma with (acute) exacerbation: Secondary | ICD-10-CM

## 2021-11-20 MED ORDER — LORATADINE 10 MG PO TABS: 10.0000 mg | ORAL_TABLET | Freq: Every day | ORAL | 11 refills | Status: DC

## 2021-11-20 MED ORDER — FLUTICASONE PROPIONATE 50 MCG/ACT NA SUSP: 2.0000 | Freq: Every day | NASAL | 6 refills | Status: DC

## 2021-11-20 MED ORDER — MONTELUKAST SODIUM 10 MG PO TABS: 10.0000 mg | ORAL_TABLET | Freq: Every day | ORAL | 3 refills | Status: DC

## 2021-11-20 MED ORDER — BUDESONIDE-FORMOTEROL FUMARATE 80-4.5 MCG/ACT IN AERO: 2.0000 | INHALATION_SPRAY | Freq: Two times a day (BID) | RESPIRATORY_TRACT | 3 refills | Status: DC

## 2021-11-20 NOTE — Patient Instructions (Signed)
Asthma, Adult ? ?Asthma is a long-term (chronic) condition that causes recurrent episodes in which the lower airways in the lungs become tight and narrow. The narrowing is caused by inflammation and tightening of the smooth muscle around the lower airways. ?Asthma episodes, also called asthma attacks or asthma flares, may cause coughing, making high-pitched whistling sounds when you breathe, most often when you breathe out (wheezing), shortness of breath, and chest pain. The airways may produce extra mucus caused by the inflammation and irritation. During an attack, it can be difficult to breathe. Asthma attacks can range from minor to life-threatening. ?Asthma cannot be cured, but medicines and lifestyle changes can help control it and treat acute attacks. It is important to keep your asthma well controlled so the condition does not interfere with your daily life. ?What are the causes? ?This condition is believed to be caused by inherited (genetic) and environmental factors, but its exact cause is not known. ?What can trigger an asthma attack? ?Many things can bring on an asthma attack or make symptoms worse. These triggers are different for every person. Common triggers include: ?Allergens and irritants like mold, dust, pet dander, cockroaches, pollen, air pollution, and chemical odors. ?Cigarette smoke. ?Weather changes and cold air. ?Stress and strong emotional responses such as crying or laughing hard. ?Certain medications such as aspirin or beta blockers. ?Infections and inflammatory conditions, such as the flu, a cold, pneumonia, or inflammation of the nasal membranes (rhinitis). ?Gastroesophageal reflux disease (GERD). ?What are the signs or symptoms? ?Symptoms may occur right after exposure to an asthma trigger or hours later and can vary by person. Common signs and symptoms include: ?Wheezing. ?Trouble breathing (shortness of breath). ?Excessive nighttime or early morning coughing. ?Chest  tightness. ?Tiredness (fatigue) with minimal activity. ?Difficulty talking in complete sentences. ?Poor exercise tolerance. ?How is this diagnosed? ?This condition is diagnosed based on: ?A physical exam and your medical history. ?Tests, which may include: ?Lung function studies to evaluate the flow of air in your lungs. ?Allergy tests. ?Imaging tests, such as X-rays. ?How is this treated? ?There is no cure, but symptoms can be controlled with proper treatment. Treatment usually involves: ?Identifying and avoiding your asthma triggers. ?Inhaled medicines. Two types are commonly used to treat asthma, depending on severity: ?Controller medicines. These help prevent asthma symptoms from occurring. They are taken every day. ?Fast-acting reliever or rescue medicines. These quickly relieve asthma symptoms. They are used as needed and provide short-term relief. ?Using other medicines, such as: ?Allergy medicines, such as antihistamines, if your asthma attacks are triggered by allergens. ?Immune medicines (immunomodulators). These are medicines that help control the immune system. ?Using supplemental oxygen. This is only needed during a severe episode. ?Creating an asthma action plan. An asthma action plan is a written plan for managing and treating your asthma attacks. This plan includes: ?A list of your asthma triggers and how to avoid them. ?Information about when medicines should be taken and when their dosage should be changed. ?Instructions about using a device called a peak flow meter. A peak flow meter measures how well the lungs are working and the severity of your asthma. It helps you monitor your condition. ?Follow these instructions at home: ?Take over-the-counter and prescription medicines only as told by your health care provider. ?Stay up to date on all vaccinations as recommended by your healthcare provider, including vaccines for the flu and pneumonia. ?Use a peak flow meter and keep track of your peak flow  readings. ?Understand and use your asthma   action plan to address any asthma flares. ?Do not smoke or allow anyone to smoke in your home. ?Contact a health care provider if: ?You have wheezing, shortness of breath, or a cough that is not responding to medicines. ?Your medicines are causing side effects, such as a rash, itching, swelling, or trouble breathing. ?You need to use a reliever medicine more than 2-3 times a week. ?Your peak flow reading is still at 50-79% of your personal best after following your action plan for 1 hour. ?You have a fever and shortness of breath. ?Get help right away if: ?You are getting worse and do not respond to treatment during an asthma attack. ?You are short of breath when at rest or when doing very little physical activity. ?You have difficulty eating, drinking, or talking. ?You have chest pain or tightness. ?You develop a fast heartbeat or palpitations. ?You have a bluish color to your lips or fingernails. ?You are light-headed or dizzy, or you faint. ?Your peak flow reading is less than 50% of your personal best. ?You feel too tired to breathe normally. ?These symptoms may be an emergency. Get help right away. Call 911. ?Do not wait to see if the symptoms will go away. ?Do not drive yourself to the hospital. ?Summary ?Asthma is a long-term (chronic) condition that causes recurrent episodes in which the airways become tight and narrow. Asthma episodes, also called asthma attacks or asthma flares, can cause coughing, wheezing, shortness of breath, and chest pain. ?Asthma cannot be cured, but medicines and lifestyle changes can help keep it well controlled and prevent asthma flares. ?Make sure you understand how to avoid triggers and how and when to use your medicines. ?Asthma attacks can range from minor to life-threatening. Get help right away if you have an asthma attack and do not respond to treatment with your usual rescue medicines. ?This information is not intended to replace  advice given to you by your health care provider. Make sure you discuss any questions you have with your health care provider. ?Document Revised: 01/11/2021 Document Reviewed: 01/02/2021 ?Elsevier Patient Education ? 2023 [employer] Inc. ? ?

## 2021-11-20 NOTE — Progress Notes (Signed)
Subjective:    Patient ID: Cynthia [name], female    DOB: 28-Aug-1965, 56 y.o.   MRN: [id number]  Chief Complaint  Patient presents with   Shortness of Breath   Wheezing   PT presents to the office today with complaints of wheezing and SOB. She is followed by Pulmonologist. Was given Symbicort that she liked, but insurance would not cover. It was then switched to Advair, but does not feel like this is working as well.   Shortness of Breath Associated symptoms include rhinorrhea, a sore throat and wheezing. Pertinent negatives include no ear pain or fever. Her past medical history is significant for asthma.  Wheezing  This is a new problem. The current episode started 1 to 4 weeks ago. Associated symptoms include rhinorrhea, shortness of breath and a sore throat. Pertinent negatives include no ear pain or fever. Her past medical history is significant for asthma.  Asthma She complains of shortness of breath and wheezing. This is a recurrent problem. The current episode started more than 1 month ago. The problem has been waxing and waning. Associated symptoms include ear congestion (right), malaise/fatigue, nasal congestion, postnasal drip, rhinorrhea, sneezing and a sore throat. Pertinent negatives include no ear pain, fever or trouble swallowing. Her symptoms are aggravated by pollen. She reports minimal improvement on treatment. Her symptoms are not alleviated by rest. Her past medical history is significant for asthma.      Review of Systems  Constitutional:  Positive for malaise/fatigue. Negative for fever.  HENT:  Positive for postnasal drip, rhinorrhea, sneezing and sore throat. Negative for ear pain and trouble swallowing.   Respiratory:  Positive for shortness of breath and wheezing.   All other systems reviewed and are negative.      Objective:   Physical Exam Vitals reviewed.  Constitutional:      General: She is not in acute distress.    Appearance: She is  well-developed. She is obese.  HENT:     Head: Normocephalic and atraumatic.     Right Ear: External ear normal.     Nose:     Comments: Nasal congestion  Eyes:     Pupils: Pupils are equal, round, and reactive to light.  Neck:     Thyroid: No thyromegaly.  Cardiovascular:     Rate and Rhythm: Normal rate and regular rhythm.     Heart sounds: Normal heart sounds. No murmur heard. Pulmonary:     Effort: Pulmonary effort is normal. No respiratory distress.     Breath sounds: Normal breath sounds. No wheezing.  Abdominal:     General: Bowel sounds are normal. There is no distension.     Palpations: Abdomen is soft.     Tenderness: There is no abdominal tenderness.  Musculoskeletal:        General: No tenderness. Normal range of motion.     Cervical back: Normal range of motion and neck supple.  Skin:    General: Skin is warm and dry.  Neurological:     Mental Status: She is alert and oriented to person, place, and time.     Cranial Nerves: No cranial nerve deficit.     Deep Tendon Reflexes: Reflexes are normal and symmetric.  Psychiatric:        Behavior: Behavior normal.        Thought Content: Thought content normal.        Judgment: Judgment normal.      BP 122/77   Pulse 82  Temp 98 F (36.7 C) (Temporal)   Ht '5\' 3"'$  (1.6 m)   Wt 222 lb 3.2 oz (100.8 kg)   SpO2 97%   BMI 39.36 kg/m       Assessment & Plan:  Cynthia [name] comes in today with chief complaint of Shortness of Breath and Wheezing   Diagnosis and orders addressed:  1. Mild persistent reactive airway disease with acute exacerbation Start Singulair and flonase Will send in Symbicort sine Advair is not working as well. More than likely will need PA.  Referral to Clinical Pharm to help get Symbicort for patient Keep Pulmonologist follow up - loratadine (CLARITIN) 10 MG tablet; Take 1 tablet (10 mg total) by mouth daily.  Dispense: 30 tablet; Refill: 11 - montelukast (SINGULAIR) 10 MG tablet;  Take 1 tablet (10 mg total) by mouth at bedtime.  Dispense: 30 tablet; Refill: 3 - fluticasone (FLONASE) 50 MCG/ACT nasal spray; Place 2 sprays into both nostrils daily.  Dispense: 16 g; Refill: 6 - budesonide-formoterol (SYMBICORT) 80-4.5 MCG/ACT inhaler; Inhale 2 puffs into the lungs 2 (two) times daily.  Dispense: 1 each; Refill: 3 - AMB Referral to [place], FNP

## 2021-11-23 ENCOUNTER — Ambulatory Visit (INDEPENDENT_AMBULATORY_CARE_PROVIDER_SITE_OTHER): Payer: BC Managed Care – PPO | Admitting: Pharmacist

## 2021-11-23 DIAGNOSIS — J410 Simple chronic bronchitis: Secondary | ICD-10-CM

## 2021-11-23 DIAGNOSIS — J441 Chronic obstructive pulmonary disease with (acute) exacerbation: Secondary | ICD-10-CM

## 2021-11-23 NOTE — Progress Notes (Signed)
    HPI Patient presents today to see pharmacy team for asthma inhaler medication assistance.  Past medical history includes: Past Medical History:  Diagnosis Date   Allergy    High cholesterol    Hypertension    Migraine    Vertigo    Asthma    Number of hospitalizations in past year: Number of asthma exacerbations in past year: 0  Respiratory Medications Current: advair diskus--does not like the powder; prefers MDI Tried in past: symbicort, qvar Patient reports adherence challenges, financial  OBJECTIVE No Known Allergies  Outpatient Encounter Medications as of 11/23/2021  Medication Sig   amLODipine (NORVASC) 10 MG tablet TAKE 1 TABLET BY MOUTH EVERY DAY   atorvastatin (LIPITOR) 20 MG tablet TAKE 1 TABLET BY MOUTH EVERY DAY (NEEDS TO BE SEEN BEFORE NEXT REFILL)   budesonide-formoterol (SYMBICORT) 80-4.5 MCG/ACT inhaler Inhale 2 puffs into the lungs 2 (two) times daily.   fluticasone (FLONASE) 50 MCG/ACT nasal spray Place 2 sprays into both nostrils daily.   fluticasone-salmeterol (ADVAIR) 100-50 MCG/ACT AEPB Inhale 1 puff into the lungs 2 (two) times daily.   hydrochlorothiazide (HYDRODIURIL) 25 MG tablet TAKE 1 TABLET BY MOUTH EVERY DAY   levalbuterol (XOPENEX HFA) 45 MCG/ACT inhaler Inhale 2 puffs into the lungs every 6 (six) hours as needed for wheezing or shortness of breath (to REPLACE albuterol). (Patient not taking: Reported on 10/02/2021)   loratadine (CLARITIN) 10 MG tablet Take 1 tablet (10 mg total) by mouth daily.   montelukast (SINGULAIR) 10 MG tablet Take 1 tablet (10 mg total) by mouth at bedtime.   No facility-administered encounter medications on file as of 11/23/2021.     Immunization History  Administered Date(s) Administered   Influenza,inj,Quad PF,6+ Mos 05/09/2021     PFTs     No data to display           Eosinophils   IgE   Assessment   Inhaler Optimization -- unable to complete due to cost; we are working to find an affordable option  for patient with high deductible insurance plan  Reviewed appropriate use of maintenance vs rescue inhalers.  Stressed importance of using maintenance inhaler daily and rescue inhaler only as needed.  Patient verbalized understanding. Medication Reconciliation  A drug regimen assessment was performed, including review of allergies, interactions, disease-state management, dosing and immunization history. Medications were reviewed with the patient, including name, instructions, indication, goals of therapy, potential side effects, importance of adherence, and safe use.  Drug interaction(s): none  Immunizations  Patient is indicated for the influenzae, pneumonia, and shingles vaccinations.  PLAN -Patient with high deductible -- pulmonology could not assist due to high deductible -Breztri samples given (no ics/laba available) to bridge patient to a supply of ICS/laba Patient prefers MDI vs DPI -PAN foundation closed for asthma -will attempt to call in [place] generic listed for $40 on [facility]  All questions encouraged and answered.  Instructed patient to reach out with any further questions or concerns.  Thank you for allowing pharmacy to participate in this patient's care.  This appointment required 25 minutes of patient care (this includes precharting, chart review, review of results, face-to-face care, etc.).   Regina Eck, PharmD, BCPS Clinical Pharmacist, [place]  [facility] Phone [phone]

## 2021-12-01 ENCOUNTER — Other Ambulatory Visit: Payer: Self-pay | Admitting: Family Medicine

## 2021-12-01 DIAGNOSIS — E7800 Pure hypercholesterolemia, unspecified: Secondary | ICD-10-CM

## 2021-12-04 ENCOUNTER — Ambulatory Visit: Payer: BC Managed Care – PPO | Admitting: Pulmonary Disease

## 2021-12-15 ENCOUNTER — Other Ambulatory Visit: Payer: Self-pay | Admitting: Family Medicine

## 2021-12-15 DIAGNOSIS — E7800 Pure hypercholesterolemia, unspecified: Secondary | ICD-10-CM

## 2021-12-15 DIAGNOSIS — I10 Essential (primary) hypertension: Secondary | ICD-10-CM

## 2022-01-16 ENCOUNTER — Other Ambulatory Visit: Payer: Self-pay | Admitting: Family Medicine

## 2022-01-16 DIAGNOSIS — E7800 Pure hypercholesterolemia, unspecified: Secondary | ICD-10-CM

## 2022-01-22 ENCOUNTER — Ambulatory Visit (INDEPENDENT_AMBULATORY_CARE_PROVIDER_SITE_OTHER): Payer: BC Managed Care – PPO | Admitting: Nurse Practitioner

## 2022-01-22 ENCOUNTER — Encounter: Payer: Self-pay | Admitting: Nurse Practitioner

## 2022-01-22 VITALS — BP 128/79 | HR 87 | Temp 98.6°F | Ht 63.0 in | Wt 224.0 lb

## 2022-01-22 DIAGNOSIS — J302 Other seasonal allergic rhinitis: Secondary | ICD-10-CM | POA: Diagnosis not present

## 2022-01-22 DIAGNOSIS — J4531 Mild persistent asthma with (acute) exacerbation: Secondary | ICD-10-CM

## 2022-01-22 DIAGNOSIS — T7840XD Allergy, unspecified, subsequent encounter: Secondary | ICD-10-CM

## 2022-01-22 MED ORDER — PREDNISONE 20 MG PO TABS: 20.0000 mg | ORAL_TABLET | Freq: Every day | ORAL | 0 refills | Status: DC

## 2022-01-22 MED ORDER — MONTELUKAST SODIUM 10 MG PO TABS: 10.0000 mg | ORAL_TABLET | Freq: Every day | ORAL | 3 refills | Status: DC

## 2022-01-22 NOTE — Progress Notes (Signed)
Acute Office Visit  Subjective:     Patient ID: Cynthia [name], female    DOB: [birth year], 56 y.o.   MRN: [id number]  Chief Complaint  Patient presents with   Sinus Problem   Wheezing    Started 3 days ago   Cough    Dry cough   Nasal Congestion    Sinus Problem This is a recurrent problem. The current episode started in the past 7 days. The problem is unchanged. There has been no fever. She is experiencing no pain. Associated symptoms include coughing and sneezing. Pertinent negatives include no chills, headaches, hoarse voice or shortness of breath. Treatments tried: claritin. The treatment provided mild relief.  Wheezing  This is a new problem. The problem occurs intermittently (at night time). The problem has been unchanged. Associated symptoms include coughing. Pertinent negatives include no abdominal pain, chest pain, chills, headaches, rash or shortness of breath. Nothing aggravates the symptoms. She has tried nothing (pt reports only at night) for the symptoms. There is no history of pneumonia. Reactive airway disease  Cough The cough is Non-productive. Associated symptoms include wheezing. Pertinent negatives include no chest pain, chills, headaches, rash or shortness of breath. Her past medical history is significant for environmental allergies. There is no history of pneumonia. Reactive airway disease     Review of Systems  Constitutional: Negative.  Negative for chills.  HENT:  Positive for sneezing. Negative for hoarse voice.   Respiratory:  Positive for cough and wheezing. Negative for shortness of breath.   Cardiovascular:  Negative for chest pain.  Gastrointestinal:  Negative for abdominal pain.  Skin: Negative.  Negative for itching and rash.  Neurological:  Negative for headaches.  Endo/Heme/Allergies:  Positive for environmental allergies.  All other systems reviewed and are negative.       Objective:    BP 128/79   Pulse 87   Temp 98.6 F (37 C)    Ht '5\' 3"'$  (1.6 m)   Wt 224 lb (101.6 kg)   SpO2 95%   BMI 39.68 kg/m  Wt Readings from Last 3 Encounters:  01/22/22 224 lb (101.6 kg)  11/20/21 222 lb 3.2 oz (100.8 kg)  10/02/21 225 lb 12.8 oz (102.4 kg)      Physical Exam Vitals and nursing note reviewed.  Constitutional:      Appearance: Normal appearance.  HENT:     Head: Normocephalic.     Right Ear: External ear normal.     Left Ear: External ear normal. There is no impacted cerumen.     Nose: Congestion present.     Comments: Patient reports wheezing at night, lungs are clear today    Mouth/Throat:     Mouth: Mucous membranes are moist.     Pharynx: Oropharynx is clear.  Eyes:     Conjunctiva/sclera: Conjunctivae normal.  Cardiovascular:     Rate and Rhythm: Normal rate and regular rhythm.     Pulses: Normal pulses.     Heart sounds: Normal heart sounds.  Pulmonary:     Effort: Pulmonary effort is normal.     Breath sounds: No wheezing.  Abdominal:     General: Bowel sounds are normal.  Neurological:     Mental Status: She is alert and oriented to person, place, and time.     No results found for any visits on 01/22/22.      Assessment & Plan:  Patient presents with allergic /viral upper respiratory symptoms in the past few days. She  denies fever, body aches and flu like symptoms.  Patient reports using only Claritin for her symptoms and is completely out of her breathing treatment and unable to afford a few of her medication due to cost. We will check clinic for samples to give to patient, I refilled singular, Advised patient to  continue daily allergy medication. Take meds as prescribed - Use a cool mist humidifier  -Use saline nose sprays frequently -Force fluids -For fever or aches or pains- take Tylenol or ibuprofen. -If symptoms do not improve, she may need to be COVID tested to rule this out Follow up with worsening unresolved symptoms  Problem List Items Addressed This Visit       Respiratory    Reactive airway disease   Relevant Medications   montelukast (SINGULAIR) 10 MG tablet   Other Visit Diagnoses     Allergy, subsequent encounter    -  Primary   Relevant Medications   montelukast (SINGULAIR) 10 MG tablet   predniSONE (DELTASONE) 20 MG tablet       Meds ordered this encounter  Medications   montelukast (SINGULAIR) 10 MG tablet    Sig: Take 1 tablet (10 mg total) by mouth at bedtime.    Dispense:  30 tablet    Refill:  3    Order Specific Question:   Supervising Provider    Answer:   Claretta Fraise [[id number]]   predniSONE (DELTASONE) 20 MG tablet    Sig: Take 1 tablet (20 mg total) by mouth daily with breakfast.    Dispense:  6 tablet    Refill:  0    Order Specific Question:   Supervising Provider    Answer:   Jeneen Rinks    Return if symptoms worsen or fail to improve.  Ivy Lynn, NP

## 2022-01-22 NOTE — Patient Instructions (Signed)
Allergies, Adult An allergy means that your body reacts to something that bothers it (allergen). This can happen from something that you eat, breathe in, or touch. Allergies often affect the nose, eyes, skin, and stomach. They can be mild, moderate, or very bad (severe). An allergy cannot spread from person to person. They can happen at any age. Sometimes, people outgrow them. What are the causes? Outdoor things, such as pollen, car fumes, and mold. Indoor things, such as dust, smoke, mold, and pets. Foods. Medicines. Things that bother your skin, such as perfume and bug bites. What increases the risk? Having family members with allergies or asthma. What are the signs or symptoms? Symptoms depend on how bad your allergy is. Mild to moderate symptoms Runny nose, stuffy nose, or sneezing. Itchy mouth, ears, or throat. A feeling of mucus dripping down the back of your throat. Sore throat. Eyes that are itchy, red, watery, or puffy. A skin rash, or red, swollen areas of skin (hives). Stomach cramps or bloating. Severe symptoms Very bad allergies to food, medicine, or bug bites may cause a very bad allergy reaction (anaphylaxis). This can be life-threatening. Symptoms include: A red face. Wheezing or coughing. Swollen lips, tongue, or mouth. Tight or swollen throat. Chest pain or tightness, or a fast heartbeat. Trouble breathing or shortness of breath. Pain in your belly (abdomen), vomiting, or watery poop (diarrhea). Feeling dizzy or fainting. How is this treated?     Treatment for this condition depends on your symptoms. Treatment may include: Cold, wet cloths for itching and swelling. Eye drops, nose sprays, or skin creams. Washing out your nose each day. A humidifier. Medicines. A change to the foods you eat. Being exposed again and again to tiny amounts of allergens. This helps your body get used to them. You might have: Allergy shots. Very small amounts of allergen put  under your tongue. An emergency shot (auto-injector pen) if you have a very bad allergy reaction. This is a medicine with a needle. You can put it into your skin by yourself. Your doctor will teach you how to use it. Follow these instructions at home: Medicines  Take or apply over-the-counter and prescription medicines only as told by your doctor. If you are at risk for a very bad allergy reaction, keep an auto-injector pen with you all the time. Eating and drinking Follow instructions from your doctor about what to eat and drink. Drink enough fluid to keep your pee (urine) pale yellow. General instructions If you have ever had a very bad allergy reaction, wear a medical alert bracelet or necklace. Stay away from things that you are allergic to. Keep all follow-up visits as told by your doctor. This is important. Contact a doctor if: Your symptoms do not get better with treatment. Get help right away if: You have symptoms of a very bad allergy reaction. These include: A swollen mouth, tongue, or throat. Pain or tightness in your chest. Trouble breathing. Being short of breath. Dizziness. Fainting. Very bad pain in your belly. Vomiting. Watery poop. These symptoms may be an emergency. Do not wait to see if the symptoms will go away. Get medical help right away. Call your local emergency services (911 in the [place] Do not drive yourself to the hospital. Summary Take or apply over-the-counter and prescription medicines only as told by your doctor. Stay away from things you are allergic to. If you are at risk for a very bad allergy reaction, carry an auto-injector pen all the time.   Wear a medical alert bracelet or necklace. Very bad allergy reactions can be life-threatening. Get help right away. This information is not intended to replace advice given to you by your health care provider. Make sure you discuss any questions you have with your health care provider. Document Revised:  02/04/2019 Document Reviewed: 02/04/2019 Elsevier Patient Education  2023 Elsevier Inc.  

## 2022-03-02 ENCOUNTER — Other Ambulatory Visit: Payer: Self-pay | Admitting: Family Medicine

## 2022-03-02 DIAGNOSIS — I10 Essential (primary) hypertension: Secondary | ICD-10-CM

## 2022-03-05 NOTE — Telephone Encounter (Signed)
[facility]akes NTBS 30 days given 12/16/21

## 2022-03-06 ENCOUNTER — Other Ambulatory Visit: Payer: Self-pay | Admitting: Family Medicine

## 2022-03-06 DIAGNOSIS — I10 Essential (primary) hypertension: Secondary | ICD-10-CM

## 2022-03-06 NOTE — Telephone Encounter (Signed)
Pt scheduled apt 03/14/2022. She is asking for refill till apt.

## 2022-03-14 ENCOUNTER — Ambulatory Visit: Payer: BC Managed Care – PPO | Admitting: Family Medicine

## 2022-03-14 ENCOUNTER — Encounter: Payer: Self-pay | Admitting: Family Medicine

## 2022-03-28 ENCOUNTER — Other Ambulatory Visit: Payer: Self-pay | Admitting: Family Medicine

## 2022-03-28 DIAGNOSIS — I10 Essential (primary) hypertension: Secondary | ICD-10-CM

## 2022-03-31 ENCOUNTER — Other Ambulatory Visit: Payer: Self-pay | Admitting: Family Medicine

## 2022-03-31 DIAGNOSIS — I10 Essential (primary) hypertension: Secondary | ICD-10-CM

## 2022-04-01 ENCOUNTER — Other Ambulatory Visit: Payer: Self-pay | Admitting: Family Medicine

## 2022-04-01 DIAGNOSIS — K644 Residual hemorrhoidal skin tags: Secondary | ICD-10-CM

## 2022-04-03 ENCOUNTER — Other Ambulatory Visit: Payer: Self-pay | Admitting: Family Medicine

## 2022-04-03 DIAGNOSIS — I10 Essential (primary) hypertension: Secondary | ICD-10-CM

## 2022-04-05 ENCOUNTER — Telehealth: Payer: Self-pay | Admitting: Family Medicine

## 2022-04-05 NOTE — Telephone Encounter (Signed)
  Prescription Request  04/05/2022  Is this a "Controlled Substance" medicine?   Have you seen your PCP in the last 2 weeks? Appt made fro 1/16  If YES, route message to pool  -  If NO, patient needs to be scheduled for appointment.  What is the name of the medication or equipment? hydrochlorothiazide (HYDRODIURIL) 25 MG tablet   Have you contacted your pharmacy to request a refill? yes   Which pharmacy would you like this sent to? [facility] in [place]   Patient notified that their request is being sent to the clinical staff for review and that they should receive a response within 2 business days.

## 2022-04-09 ENCOUNTER — Other Ambulatory Visit: Payer: Self-pay | Admitting: Family Medicine

## 2022-04-10 NOTE — Telephone Encounter (Signed)
Closing encounter, refill done via inbasket request

## 2022-04-24 ENCOUNTER — Ambulatory Visit: Payer: BC Managed Care – PPO | Admitting: Family Medicine

## 2022-05-03 ENCOUNTER — Other Ambulatory Visit: Payer: Self-pay | Admitting: Family Medicine

## 2022-05-06 ENCOUNTER — Other Ambulatory Visit: Payer: Self-pay | Admitting: Family Medicine

## 2022-05-06 ENCOUNTER — Telehealth: Payer: Self-pay | Admitting: Family Medicine

## 2022-05-06 DIAGNOSIS — K644 Residual hemorrhoidal skin tags: Secondary | ICD-10-CM

## 2022-05-06 DIAGNOSIS — E7800 Pure hypercholesterolemia, unspecified: Secondary | ICD-10-CM

## 2022-05-06 DIAGNOSIS — I10 Essential (primary) hypertension: Secondary | ICD-10-CM

## 2022-05-08 ENCOUNTER — Other Ambulatory Visit: Payer: Self-pay | Admitting: Family Medicine

## 2022-05-08 NOTE — Telephone Encounter (Signed)
Pt called and made an appt to see PCP for med refill. PCP doesn't have any openings until 05/29/22 so pt had to make appt for that date. Can refills be sent in for pt to last her until appt?

## 2022-05-09 MED ORDER — ATORVASTATIN CALCIUM 20 MG PO TABS: ORAL_TABLET | ORAL | 0 refills | Status: DC

## 2022-05-09 MED ORDER — AMLODIPINE BESYLATE 10 MG PO TABS: ORAL_TABLET | ORAL | 0 refills | Status: DC

## 2022-05-09 NOTE — Addendum Note (Signed)
Addended by: Antonietta Barcelona D on: 05/09/2022 03:25 PM   Modules accepted: Orders

## 2022-05-10 ENCOUNTER — Encounter: Payer: Self-pay | Admitting: Pharmacist

## 2022-05-10 ENCOUNTER — Telehealth: Payer: Self-pay | Admitting: Pharmacist

## 2022-05-10 DIAGNOSIS — J410 Simple chronic bronchitis: Secondary | ICD-10-CM

## 2022-05-10 MED ORDER — AIRSUPRA 90-80 MCG/ACT IN AERO: 2.0000 | INHALATION_SPRAY | Freq: Once | RESPIRATORY_TRACT | 5 refills | Status: DC | PRN

## 2022-05-10 NOTE — Telephone Encounter (Signed)
HPI Patient presents today to see pharmacy team for asthma inhaler medication assistance.  Past medical history includes:     Past Medical History:  Diagnosis Date   Allergy     High cholesterol     Hypertension     Migraine     Vertigo      Asthma      Number of hospitalizations in past year: 0 Number of asthma exacerbations in past year: 2   Respiratory Medications Current: advair diskus--does not like the powder; prefers MDI Tried in past: symbicort, qvar Patient reports adherence challenges, financial   OBJECTIVE No Known Allergies   Current Outpatient Medications on File Prior to Visit  Medication Sig Dispense Refill   amLODipine (NORVASC) 10 MG tablet TAKE 1 TABLET BY MOUTH EVERY DAY 30 tablet 0   atorvastatin (LIPITOR) 20 MG tablet TAKE 1 TABLET BY MOUTH EVERY DAY 30 tablet 0   budesonide-formoterol (SYMBICORT) 80-4.5 MCG/ACT inhaler Inhale 2 puffs into the lungs 2 (two) times daily. 1 each 3   fluticasone (FLONASE) 50 MCG/ACT nasal spray Place 2 sprays into both nostrils daily. 16 g 6   hydrochlorothiazide (HYDRODIURIL) 25 MG tablet TAKE 1 TABLET (25 MG TOTAL) BY MOUTH DAILY. 30 tablet 0   levalbuterol (XOPENEX HFA) 45 MCG/ACT inhaler Inhale 2 puffs into the lungs every 6 (six) hours as needed for wheezing or shortness of breath (to REPLACE albuterol). 1 each 12   loratadine (CLARITIN) 10 MG tablet Take 1 tablet (10 mg total) by mouth daily. 30 tablet 11   montelukast (SINGULAIR) 10 MG tablet Take 1 tablet (10 mg total) by mouth at bedtime. 30 tablet 3   predniSONE (DELTASONE) 20 MG tablet Take 1 tablet (20 mg total) by mouth daily with breakfast. 6 tablet 0   No current facility-administered medications on file prior to visit.         Outpatient Encounter Medications as of 11/23/2021  Medication Sig   amLODipine (NORVASC) 10 MG tablet TAKE 1 TABLET BY MOUTH EVERY DAY   atorvastatin (LIPITOR) 20 MG tablet TAKE 1 TABLET BY MOUTH EVERY DAY  (NEEDS TO BE SEEN BEFORE NEXT REFILL)   budesonide-formoterol (SYMBICORT) 80-4.5 MCG/ACT inhaler Inhale 2 puffs into the lungs 2 (two) times daily.   fluticasone (FLONASE) 50 MCG/ACT nasal spray Place 2 sprays into both nostrils daily.   fluticasone-salmeterol (ADVAIR) 100-50 MCG/ACT AEPB Inhale 1 puff into the lungs 2 (two) times daily.   hydrochlorothiazide (HYDRODIURIL) 25 MG tablet TAKE 1 TABLET BY MOUTH EVERY DAY   levalbuterol (XOPENEX HFA) 45 MCG/ACT inhaler Inhale 2 puffs into the lungs every 6 (six) hours as needed for wheezing or shortness of breath (to REPLACE albuterol). (Patient not taking: Reported on 10/02/2021)   loratadine (CLARITIN) 10 MG tablet Take 1 tablet (10 mg total) by mouth daily.   montelukast (SINGULAIR) 10 MG tablet Take 1 tablet (10 mg total) by mouth at bedtime.    No facility-administered encounter medications on file as of 11/23/2021.          Immunization History  Administered Date(s) Administered   Influenza,inj,Quad PF,6+ Mos 05/09/2021      PFTs       No data to display             Eosinophils    IgE     Assessment    Inhaler Optimization -- unable to complete due to cost; we are working  to find an affordable option for patient with high deductible insurance plan    Reviewed appropriate use of maintenance vs rescue inhalers.  Stressed importance of using maintenance inhaler daily and rescue inhaler only as needed.  Patient verbalized understanding. Medication Reconciliation   A drug regimen assessment was performed, including review of allergies, interactions, disease-state management, dosing and immunization history. Medications were reviewed with the patient, including name, instructions, indication, goals of therapy, potential side effects, importance of adherence, and safe use.   Drug interaction(s): none   Immunizations   Patient is indicated for the influenzae, pneumonia, and shingles vaccinations.  4. Hypertension--reports  controlled with avg BP at home ranging from 125-134/70-84.  On appropriate meds for HTN.      PLAN -Patient with high deductible -- pulmonology could not assist due to high deductible -Breztri samples given (no ics/laba available) to bridge patient to a supply of ICS/laba Patient prefers MDI vs DPI -PAN foundation closed for asthma -will attempt to call in [place] with new copay card to see if affordable   All questions encouraged and answered.  Instructed patient to reach out with any further questions or concerns.   Thank you for allowing pharmacy to participate in this patient's care.   This appointment required 25 minutes of patient care (this includes precharting, chart review, review of results, face-to-face care, etc.).    Regina Eck, PharmD, BCPS, BCACP Clinical Pharmacist, [place]  [facility]  T [phone]

## 2022-05-29 ENCOUNTER — Ambulatory Visit: Payer: BC Managed Care – PPO | Admitting: Family Medicine

## 2022-05-31 ENCOUNTER — Other Ambulatory Visit: Payer: Self-pay | Admitting: Family Medicine

## 2022-06-04 ENCOUNTER — Other Ambulatory Visit: Payer: Self-pay | Admitting: Family Medicine

## 2022-06-04 MED ORDER — HYDROCHLOROTHIAZIDE 25 MG PO TABS: 25.0000 mg | ORAL_TABLET | Freq: Every day | ORAL | 0 refills | Status: DC

## 2022-06-04 NOTE — Telephone Encounter (Signed)
Refill denied-pt needs appointment for refills. Please schedule pt with PCP.

## 2022-06-04 NOTE — Telephone Encounter (Signed)
MADE APPT ON 07/04/22 / PT IS OUT OF MEDS

## 2022-06-06 ENCOUNTER — Other Ambulatory Visit: Payer: Self-pay | Admitting: Family Medicine

## 2022-06-06 DIAGNOSIS — E7800 Pure hypercholesterolemia, unspecified: Secondary | ICD-10-CM

## 2022-06-10 ENCOUNTER — Other Ambulatory Visit: Payer: Self-pay | Admitting: Family Medicine

## 2022-06-10 DIAGNOSIS — I10 Essential (primary) hypertension: Secondary | ICD-10-CM

## 2022-06-19 ENCOUNTER — Other Ambulatory Visit: Payer: Self-pay | Admitting: Family Medicine

## 2022-07-04 ENCOUNTER — Ambulatory Visit: Payer: BC Managed Care – PPO | Admitting: Family Medicine

## 2022-07-11 ENCOUNTER — Other Ambulatory Visit: Payer: Self-pay | Admitting: Family Medicine

## 2022-07-11 DIAGNOSIS — I10 Essential (primary) hypertension: Secondary | ICD-10-CM

## 2022-07-12 ENCOUNTER — Ambulatory Visit: Payer: BC Managed Care – PPO | Admitting: Family Medicine

## 2022-07-12 ENCOUNTER — Encounter: Payer: Self-pay | Admitting: Family Medicine

## 2022-07-12 VITALS — BP 128/81 | HR 88 | Temp 97.8°F | Ht 63.0 in | Wt 227.6 lb

## 2022-07-12 DIAGNOSIS — R062 Wheezing: Secondary | ICD-10-CM | POA: Diagnosis not present

## 2022-07-12 DIAGNOSIS — J219 Acute bronchiolitis, unspecified: Secondary | ICD-10-CM

## 2022-07-12 MED ORDER — FLUTICASONE PROPIONATE 50 MCG/ACT NA SUSP
2.0000 | Freq: Every day | NASAL | 6 refills | Status: DC
Start: 2022-07-12 — End: 2023-03-22

## 2022-07-12 MED ORDER — CETIRIZINE HCL 10 MG PO TABS
10.0000 mg | ORAL_TABLET | Freq: Every day | ORAL | 11 refills | Status: DC
Start: 2022-07-12 — End: 2022-08-17

## 2022-07-12 MED ORDER — METHYLPREDNISOLONE ACETATE 40 MG/ML IJ SUSP
40.0000 mg | Freq: Once | INTRAMUSCULAR | Status: AC
Start: 2022-07-12 — End: 2022-07-12
  Administered 2022-07-12: 60 mg via INTRAMUSCULAR

## 2022-07-12 MED ORDER — GUAIFENESIN ER 600 MG PO TB12
600.0000 mg | ORAL_TABLET | Freq: Two times a day (BID) | ORAL | 0 refills | Status: AC
Start: 2022-07-12 — End: 2022-07-22

## 2022-07-12 MED ORDER — AMOXICILLIN-POT CLAVULANATE 875-125 MG PO TABS
1.0000 | ORAL_TABLET | Freq: Two times a day (BID) | ORAL | 0 refills | Status: AC
Start: 2022-07-12 — End: 2022-07-22

## 2022-07-12 NOTE — Progress Notes (Signed)
Subjective:  Patient ID: Cynthia [name], female    DOB: November 14, 1965, 57 y.o.   MRN: [id number]  Patient Care Team: Baruch Gouty, FNP as PCP - General (Family Medicine) Izora Gala, MD as Consulting Physician (Otolaryngology)   Chief Complaint:  Cough, Nasal Congestion, Wheezing, and Sore Throat (X 3 days )   HPI: Cynthia [name] is a 57 y.o. female presenting on 07/12/2022 for Cough, Nasal Congestion, Wheezing, and Sore Throat (X 3 days )   Cough This is a recurrent problem. The problem has been gradually worsening. The problem occurs every few minutes. The cough is Productive of purulent sputum. Associated symptoms include ear congestion, nasal congestion, postnasal drip, rhinorrhea, a sore throat and wheezing. Pertinent negatives include no chest pain, chills, fever, heartburn, hemoptysis, myalgias, rash, sweats or weight loss. Nothing aggravates the symptoms. She has tried OTC cough suppressant for the symptoms. The treatment provided no relief. Her past medical history is significant for environmental allergies. There is no history of bronchitis.  Wheezing  This is a recurrent problem. The problem has been gradually worsening. Associated symptoms include coughing, rhinorrhea, a sore throat and sputum production. Pertinent negatives include no chest pain, chills, coryza, fever, hemoptysis or rash. The symptoms are aggravated by any activity. She has tried OTC cough suppressant for the symptoms. The treatment provided no relief.   Relevant past medical, surgical, family, and social history reviewed and updated as indicated.  Allergies and medications reviewed and updated. Data reviewed: Chart in Epic.   Past Medical History:  Diagnosis Date   Allergy    High cholesterol    Hypertension    Migraine    Vertigo     Past Surgical History:  Procedure Laterality Date   ABDOMINAL HYSTERECTOMY     BREAST BIOPSY Left 2018   BREAST EXCISIONAL BIOPSY Left    BREAST SURGERY  Left    lump removed    CESAREAN SECTION     x2   CYST REMOVAL LEG     FRACTURE SURGERY Right    Arm   TONSILLECTOMY      Social History   Socioeconomic History   Marital status: Married    Spouse name: Not on file   Number of children: Not on file   Years of education: Not on file   Highest education level: Not on file  Occupational History   Not on file  Tobacco Use   Smoking status: Former    Packs/day: 0.50    Years: 10.00    Additional pack years: 0.00    Total pack years: 5.00    Types: Cigarettes    Quit date: 06/04/2016    Years since quitting: 6.1   Smokeless tobacco: Never  Vaping Use   Vaping Use: Never used  Substance and Sexual Activity   Alcohol use: Yes    Comment: occ   Drug use: No   Sexual activity: Not Currently    Partners: Male    Comment: Married  Other Topics Concern   Not on file  Social History Narrative   Lives at home w/ her husband   Left-handed   Caffeine: 2-3 sodas per day   Social Determinants of Health   Financial Resource Strain: Not on file  Food Insecurity: Not on file  Transportation Needs: Not on file  Physical Activity: Not on file  Stress: Not on file  Social Connections: Not on file  Intimate Partner Violence: Not on file    Outpatient  Encounter Medications as of 07/12/2022  Medication Sig   Albuterol-Budesonide (AIRSUPRA) 90-80 MCG/ACT AERO Inhale 2 Inhalations into the lungs once as needed for up to 1 dose (MAX 6 doses (12 inhalations) per 24 hours).   amLODipine (NORVASC) 10 MG tablet TAKE 1 TABLET BY MOUTH EVERY DAY (NEEDS TO BE SEEN BEFORE NEXT REFILL)   amoxicillin-clavulanate (AUGMENTIN) 875-125 MG tablet Take 1 tablet by mouth 2 (two) times daily for 10 days.   atorvastatin (LIPITOR) 20 MG tablet TAKE 1 TABLET BY MOUTH EVERY DAY   budesonide-formoterol (SYMBICORT) 80-4.5 MCG/ACT inhaler Inhale 2 puffs into the lungs 2 (two) times daily.   cetirizine (ZYRTEC) 10 MG tablet Take 1 tablet (10 mg total) by mouth  daily.   fluticasone (FLONASE) 50 MCG/ACT nasal spray Place 2 sprays into both nostrils daily.   guaiFENesin (MUCINEX) 600 MG 12 hr tablet Take 1 tablet (600 mg total) by mouth 2 (two) times daily for 10 days.   hydrochlorothiazide (HYDRODIURIL) 25 MG tablet Take 1 tablet (25 mg total) by mouth daily.   levalbuterol (XOPENEX HFA) 45 MCG/ACT inhaler Inhale 2 puffs into the lungs every 6 (six) hours as needed for wheezing or shortness of breath (to REPLACE albuterol).   montelukast (SINGULAIR) 10 MG tablet Take 1 tablet (10 mg total) by mouth at bedtime.   [DISCONTINUED] fluticasone (FLONASE) 50 MCG/ACT nasal spray Place 2 sprays into both nostrils daily.   [DISCONTINUED] loratadine (CLARITIN) 10 MG tablet Take 1 tablet (10 mg total) by mouth daily.   [DISCONTINUED] predniSONE (DELTASONE) 20 MG tablet Take 1 tablet (20 mg total) by mouth daily with breakfast.   Facility-Administered Encounter Medications as of 07/12/2022  Medication   methylPREDNISolone acetate (DEPO-MEDROL) injection 40 mg    No Known Allergies  Review of Systems  Constitutional:  Positive for activity change. Negative for appetite change, chills, diaphoresis, fatigue, fever, unexpected weight change and weight loss.  HENT:  Positive for postnasal drip, rhinorrhea, sinus pressure, sinus pain and sore throat. Negative for dental problem, facial swelling, hearing loss, mouth sores, nosebleeds, sneezing, tinnitus and voice change.   Eyes: Negative.  Negative for photophobia and visual disturbance.  Respiratory:  Positive for cough, sputum production and wheezing. Negative for apnea, hemoptysis, choking and chest tightness.   Cardiovascular:  Negative for chest pain, palpitations and leg swelling.  Gastrointestinal:  Negative for blood in stool, constipation, heartburn and nausea.  Endocrine: Negative.  Negative for polydipsia, polyphagia and polyuria.  Genitourinary:  Negative for decreased urine volume, difficulty urinating,  dysuria, frequency and urgency.  Musculoskeletal:  Negative for arthralgias and myalgias.  Skin: Negative.  Negative for rash.  Allergic/Immunologic: Positive for environmental allergies.  Neurological:  Negative for dizziness, tremors, seizures, syncope, facial asymmetry, speech difficulty, weakness, light-headedness and numbness.  Hematological: Negative.   Psychiatric/Behavioral:  Negative for confusion, hallucinations, sleep disturbance and suicidal ideas.   All other systems reviewed and are negative.       Objective:  BP 128/81   Pulse 88   Temp 97.8 F (36.6 C) (Temporal)   Ht 5\' 3"  (1.6 m)   Wt 227 lb 9.6 oz (103.2 kg)   SpO2 93%   BMI 40.32 kg/m    Wt Readings from Last 3 Encounters:  07/12/22 227 lb 9.6 oz (103.2 kg)  01/22/22 224 lb (101.6 kg)  11/20/21 222 lb 3.2 oz (100.8 kg)    Physical Exam Vitals and nursing note reviewed.  Constitutional:      General: She is not in acute  distress.    Appearance: Normal appearance. She is well-developed and well-groomed. She is obese. She is not ill-appearing, toxic-appearing or diaphoretic.  HENT:     Head: Normocephalic and atraumatic.     Jaw: There is normal jaw occlusion.     Right Ear: Hearing normal. A middle ear effusion is present. Tympanic membrane is not erythematous.     Left Ear: Hearing normal. A middle ear effusion is present. Tympanic membrane is not erythematous.     Nose: Congestion present.     Mouth/Throat:     Lips: Pink.     Mouth: Mucous membranes are moist.     Pharynx: Oropharynx is clear. Uvula midline.  Eyes:     General: Lids are normal. Allergic shiner present.     Extraocular Movements: Extraocular movements intact.     Conjunctiva/sclera: Conjunctivae normal.     Pupils: Pupils are equal, round, and reactive to light.  Neck:     Thyroid: No thyroid mass, thyromegaly or thyroid tenderness.     Vascular: No carotid bruit or JVD.     Trachea: Trachea and phonation normal.   Cardiovascular:     Rate and Rhythm: Normal rate and regular rhythm.     Chest Wall: PMI is not displaced.     Pulses: Normal pulses.     Heart sounds: Normal heart sounds. No murmur heard.    No friction rub. No gallop.  Pulmonary:     Effort: Pulmonary effort is normal. No respiratory distress.     Breath sounds: Wheezing present.  Abdominal:     General: There is no abdominal bruit.     Palpations: There is no hepatomegaly or splenomegaly.  Musculoskeletal:        General: Normal range of motion.     Cervical back: Normal range of motion and neck supple.     Right lower leg: No edema.     Left lower leg: No edema.  Lymphadenopathy:     Cervical: No cervical adenopathy.  Skin:    General: Skin is warm and dry.     Capillary Refill: Capillary refill takes less than 2 seconds.     Coloration: Skin is not cyanotic, jaundiced or pale.     Findings: No rash.  Neurological:     General: No focal deficit present.     Mental Status: She is alert and oriented to person, place, and time.     Sensory: Sensation is intact.     Motor: Motor function is intact.     Coordination: Coordination is intact.     Gait: Gait is intact.     Deep Tendon Reflexes: Reflexes are normal and symmetric.  Psychiatric:        Attention and Perception: Attention and perception normal.        Mood and Affect: Mood and affect normal.        Speech: Speech normal.        Behavior: Behavior normal. Behavior is cooperative.        Thought Content: Thought content normal.        Cognition and Memory: Cognition and memory normal.        Judgment: Judgment normal.     Results for orders placed or performed in visit on 08/28/21  Novel Coronavirus, NAA ([facility])   Specimen: Nasopharyngeal(NP) swabs in vial transport medium  Result Value Ref Range   SARS-CoV-2, NAA Not Detected Not Detected       Pertinent labs & imaging results  that were available during my care of the patient were reviewed by me and  considered in my medical decision making.  Assessment & Plan:  Roxie was seen today for cough, nasal congestion, wheezing and sore throat.  Diagnoses and all orders for this visit:  Acute bronchiolitis with bronchospasm Wheezing Ongoing and worsening symptoms despite symptomatic care at home. Will add below to regimen. Pt aware to use inhalers and Singulair as prescribed. Report new, worsening, or persistent symptoms.  -     cetirizine (ZYRTEC) 10 MG tablet; Take 1 tablet (10 mg total) by mouth daily. -     guaiFENesin (MUCINEX) 600 MG 12 hr tablet; Take 1 tablet (600 mg total) by mouth 2 (two) times daily for 10 days. -     amoxicillin-clavulanate (AUGMENTIN) 875-125 MG tablet; Take 1 tablet by mouth 2 (two) times daily for 10 days. -     fluticasone (FLONASE) 50 MCG/ACT nasal spray; Place 2 sprays into both nostrils daily. -     methylPREDNISolone acetate (DEPO-MEDROL) injection 40 mg     Continue all other maintenance medications.  Follow up plan: Return if symptoms worsen or fail to improve.   Continue healthy lifestyle choices, including diet (rich in fruits, vegetables, and lean proteins, and low in salt and simple carbohydrates) and exercise (at least 30 minutes of moderate physical activity daily).   The above assessment and management plan was discussed with the patient. The patient verbalized understanding of and has agreed to the management plan. Patient is aware to call the clinic if they develop any new symptoms or if symptoms persist or worsen. Patient is aware when to return to the clinic for a follow-up visit. Patient educated on when it is appropriate to go to the emergency department.   Monia Pouch, FNP-C [facility] [phone]

## 2022-07-15 ENCOUNTER — Other Ambulatory Visit: Payer: Self-pay | Admitting: Family Medicine

## 2022-07-15 DIAGNOSIS — I10 Essential (primary) hypertension: Secondary | ICD-10-CM

## 2022-07-16 ENCOUNTER — Encounter: Payer: Self-pay | Admitting: Family Medicine

## 2022-07-16 NOTE — Telephone Encounter (Signed)
LMTCB to schedule appt Letter mailed 

## 2022-07-16 NOTE — Telephone Encounter (Signed)
30 days was sent in 06/11/22 - NTBS

## 2022-07-23 MED ORDER — AMLODIPINE BESYLATE 10 MG PO TABS
ORAL_TABLET | ORAL | 0 refills | Status: DC
Start: 2022-07-23 — End: 2022-08-20

## 2022-07-23 NOTE — Telephone Encounter (Signed)
30-D sent in, appt on 08/14/22 pt aware

## 2022-07-23 NOTE — Addendum Note (Signed)
Addended by: Julious Payer D on: 07/23/2022 02:18 PM   Modules accepted: Orders

## 2022-07-23 NOTE — Telephone Encounter (Signed)
Made appt for 5/7 - out of blood pressure medication. Would like to know if enough can be called in until appt.

## 2022-08-03 ENCOUNTER — Telehealth: Payer: Self-pay | Admitting: Family Medicine

## 2022-08-03 NOTE — Telephone Encounter (Signed)
Pt called demanding to speak with someone regarding her FMLA. Pt was very rude. Explained to pt that Cynthia [name] is not here today but that someone was covering for her and I would send them a message to check on status of FMLA and call her. Pt was very rude and said that she already knows the FMLA was done but says it was done wrong and needs to speak with someone asap about it.

## 2022-08-03 NOTE — Telephone Encounter (Signed)
Patient wanted intermittent FMLA for asthma and COPD. Patient will get [place] new FMLA paperwork and will get it to [place] today. Patient aware that we will not charge her for this FMLA form since she states it was not filled out correctly.

## 2022-08-07 NOTE — Telephone Encounter (Signed)
Paperwork has been placed on PCP's desk.

## 2022-08-08 NOTE — Telephone Encounter (Signed)
[employer] updated form completed and signed. They have been faxed to [facility] at fax number [phone]. Patient has been contacted and informed they are complete.

## 2022-08-14 ENCOUNTER — Ambulatory Visit: Payer: BC Managed Care – PPO | Admitting: Family Medicine

## 2022-08-17 ENCOUNTER — Ambulatory Visit: Payer: BC Managed Care – PPO | Admitting: Family Medicine

## 2022-08-17 ENCOUNTER — Encounter: Payer: Self-pay | Admitting: Family Medicine

## 2022-08-17 VITALS — BP 133/88 | HR 80 | Temp 98.0°F | Ht 63.0 in | Wt 227.6 lb

## 2022-08-17 DIAGNOSIS — J4531 Mild persistent asthma with (acute) exacerbation: Secondary | ICD-10-CM

## 2022-08-17 DIAGNOSIS — J301 Allergic rhinitis due to pollen: Secondary | ICD-10-CM

## 2022-08-17 MED ORDER — BREZTRI AEROSPHERE 160-9-4.8 MCG/ACT IN AERO
2.0000 | INHALATION_SPRAY | Freq: Two times a day (BID) | RESPIRATORY_TRACT | 11 refills | Status: AC
Start: 2022-08-17 — End: ?

## 2022-08-17 MED ORDER — CETIRIZINE HCL 10 MG PO TABS
10.0000 mg | ORAL_TABLET | Freq: Every day | ORAL | 11 refills | Status: AC
Start: 2022-08-17 — End: ?

## 2022-08-17 MED ORDER — METHYLPREDNISOLONE ACETATE 40 MG/ML IJ SUSP
40.0000 mg | Freq: Once | INTRAMUSCULAR | Status: AC
Start: 2022-08-17 — End: 2022-08-17
  Administered 2022-08-17: 60 mg via INTRAMUSCULAR

## 2022-08-17 MED ORDER — PREDNISONE 20 MG PO TABS
ORAL_TABLET | ORAL | 0 refills | Status: DC
Start: 2022-08-17 — End: 2022-08-17

## 2022-08-17 MED ORDER — MONTELUKAST SODIUM 10 MG PO TABS
10.0000 mg | ORAL_TABLET | Freq: Every day | ORAL | 3 refills | Status: AC
Start: 2022-08-17 — End: ?

## 2022-08-17 NOTE — Progress Notes (Signed)
Subjective:  Patient ID: Cynthia [name], female    DOB: Aug 17, 1965, 57 y.o.   MRN: [id number]   Patient Care Team: Sonny Masters, FNP as PCP - General (Family Medicine) Serena Colonel, MD as Consulting Physician (Otolaryngology)    Chief Complaint:  Cough, Wheezing, and faical pressure (X 2 days )     HPI: Cynthia [name] is a 57 y.o. female presenting on 08/17/2022 for Cough, Wheezing, and faical pressure (X 2 days )     Pt presents today for recurrent allergy, asthma, and sinusitis pressure. She has not been using her singulair, zyrtec, or inhalers. States she feels the medications are not working or they make her heart race. She was referred to ENT and pulmonology. Has not been seen by allergy. It has been over 6 moths since she saw pulmonology.    Cough This is a recurrent problem. The problem has been waxing and waning. The problem occurs constantly. The cough is Non-productive. Associated symptoms include ear congestion, ear pain, headaches, nasal congestion, postnasal drip, rhinorrhea, a sore throat, shortness of breath and wheezing. Pertinent negatives include no chest pain, chills, fever, heartburn, hemoptysis, myalgias, rash, sweats or weight loss. The symptoms are aggravated by pollens, fumes and dust.  Wheezing  Associated symptoms include coughing, ear pain, headaches, rhinorrhea, shortness of breath and a sore throat. Pertinent negatives include no chest pain, chills, fever, hemoptysis or rash.          Relevant past medical, surgical, family, and social history reviewed and updated as indicated.  Allergies and medications reviewed and updated. Data reviewed: Chart in Epic.         Past Medical History:  Diagnosis Date   Allergy     High cholesterol     Hypertension     Migraine     Vertigo             Past Surgical History:  Procedure Laterality Date   ABDOMINAL HYSTERECTOMY       BREAST BIOPSY Left 2018   BREAST EXCISIONAL BIOPSY Left     BREAST  SURGERY Left      lump removed    CESAREAN SECTION        x2   CYST REMOVAL LEG       FRACTURE SURGERY Right      Arm   TONSILLECTOMY          Social History         Socioeconomic History   Marital status: Married      Spouse name: Not on file   Number of children: Not on file   Years of education: Not on file   Highest education level: Not on file  Occupational History   Not on file  Tobacco Use   Smoking status: Former      Packs/day: 0.50      Years: 10.00      Additional pack years: 0.00      Total pack years: 5.00      Types: Cigarettes      Quit date: 06/04/2016      Years since quitting: 6.2   Smokeless tobacco: Never  Vaping Use   Vaping Use: Never used  Substance and Sexual Activity   Alcohol use: Yes      Comment: occ   Drug use: No   Sexual activity: Not Currently      Partners: Male      Comment: Married  Other Topics Concern  Not on file  Social History Narrative    Lives at home w/ her husband    Left-handed    Caffeine: 2-3 sodas per day    Social Determinants of Health    Financial Resource Strain: Not on file  Food Insecurity: Not on file  Transportation Needs: Not on file  Physical Activity: Not on file  Stress: Not on file  Social Connections: Not on file  Intimate Partner Violence: Not on file          Outpatient Encounter Medications as of 08/17/2022  Medication Sig   Albuterol-Budesonide (AIRSUPRA) 90-80 MCG/ACT AERO Inhale 2 Inhalations into the lungs once as needed for up to 1 dose (MAX 6 doses (12 inhalations) per 24 hours).   amLODipine (NORVASC) 10 MG tablet TAKE 1 TABLET BY MOUTH EVERY DAY   atorvastatin (LIPITOR) 20 MG tablet TAKE 1 TABLET BY MOUTH EVERY DAY   Budeson-Glycopyrrol-Formoterol (BREZTRI AEROSPHERE) 160-9-4.8 MCG/ACT AERO Inhale 2 puffs into the lungs 2 (two) times daily.   fluticasone (FLONASE) 50 MCG/ACT nasal spray Place 2 sprays into both nostrils daily.   hydrochlorothiazide (HYDRODIURIL) 25 MG tablet Take  1 tablet (25 mg total) by mouth daily.   levalbuterol (XOPENEX HFA) 45 MCG/ACT inhaler Inhale 2 puffs into the lungs every 6 (six) hours as needed for wheezing or shortness of breath (to REPLACE albuterol).   [DISCONTINUED] budesonide-formoterol (SYMBICORT) 80-4.5 MCG/ACT inhaler Inhale 2 puffs into the lungs 2 (two) times daily.   [DISCONTINUED] cetirizine (ZYRTEC) 10 MG tablet Take 1 tablet (10 mg total) by mouth daily.   [DISCONTINUED] montelukast (SINGULAIR) 10 MG tablet Take 1 tablet (10 mg total) by mouth at bedtime.   [DISCONTINUED] predniSONE (DELTASONE) 20 MG tablet 2 po at sametime daily for 5 days- start tomorrow   cetirizine (ZYRTEC) 10 MG tablet Take 1 tablet (10 mg total) by mouth daily.   montelukast (SINGULAIR) 10 MG tablet Take 1 tablet (10 mg total) by mouth at bedtime.   [EXPIRED] methylPREDNISolone acetate (DEPO-MEDROL) injection 40 mg      No facility-administered encounter medications on file as of 08/17/2022.      No Known Allergies   Review of Systems  Constitutional:  Negative for chills, fever and weight loss.  HENT:  Positive for ear pain, postnasal drip, rhinorrhea and sore throat.   Respiratory:  Positive for cough, shortness of breath and wheezing. Negative for hemoptysis.   Cardiovascular:  Negative for chest pain.  Gastrointestinal:  Negative for heartburn.  Musculoskeletal:  Negative for myalgias.  Skin:  Negative for rash.  Neurological:  Positive for headaches.           Objective:  BP 133/88   Pulse 80   Temp 98 F (36.7 C) (Temporal)   Ht 5\' 3"  (1.6 m)   Wt 227 lb 9.6 oz (103.2 kg)   SpO2 94%   BMI 40.32 kg/m        Wt Readings from Last 3 Encounters:  08/17/22 227 lb 9.6 oz (103.2 kg)  07/12/22 227 lb 9.6 oz (103.2 kg)  01/22/22 224 lb (101.6 kg)      Physical Exam Vitals and nursing note reviewed.  Constitutional:      General: She is not in acute distress.    Appearance: Normal appearance. She is obese. She is not ill-appearing,  toxic-appearing or diaphoretic.  HENT:     Head: Normocephalic and atraumatic.     Right Ear: Hearing, tympanic membrane, ear canal and external ear normal.  Left Ear: Hearing, tympanic membrane, ear canal and external ear normal.     Nose: Congestion present.     Right Sinus: No maxillary sinus tenderness or frontal sinus tenderness.     Left Sinus: No maxillary sinus tenderness or frontal sinus tenderness.     Comments: Cobblestoning to posterior oropharynx.     Mouth/Throat:     Mouth: Mucous membranes are moist.     Pharynx: No oropharyngeal exudate or posterior oropharyngeal erythema.  Eyes:     Conjunctiva/sclera: Conjunctivae normal.     Pupils: Pupils are equal, round, and reactive to light.  Cardiovascular:     Rate and Rhythm: Normal rate and regular rhythm.     Heart sounds: Normal heart sounds.  Pulmonary:     Effort: Pulmonary effort is normal.     Breath sounds: Normal breath sounds. No wheezing or rhonchi.  Musculoskeletal:     Cervical back: Normal range of motion and neck supple.     Right lower leg: No edema.     Left lower leg: No edema.  Skin:    General: Skin is warm and dry.     Capillary Refill: Capillary refill takes less than 2 seconds.  Neurological:     General: No focal deficit present.     Mental Status: She is alert and oriented to person, place, and time.  Psychiatric:        Mood and Affect: Mood normal.        Behavior: Behavior normal.        Thought Content: Thought content normal.        Judgment: Judgment normal.            Results for orders placed or performed in visit on 08/28/21  Novel Coronavirus, NAA ([facility])    Specimen: Nasopharyngeal(NP) swabs in vial transport medium  Result Value Ref Range    SARS-CoV-2, NAA Not Detected Not Detected         Pertinent labs & imaging results that were available during my care of the patient were reviewed by me and considered in my medical decision making.   Assessment & Plan:   Ezri was seen today for cough, wheezing and faical pressure.   Diagnoses and all orders for this visit:   Mild persistent reactive airway disease with acute exacerbation Importance of medication compliance discussed in detail. Restart below medications. Burst of steroids provided. Referral to allergy and pulmonology placed.  -     montelukast (SINGULAIR) 10 MG tablet; Take 1 tablet (10 mg total) by mouth at bedtime. -     Budeson-Glycopyrrol-Formoterol (BREZTRI AEROSPHERE) 160-9-4.8 MCG/ACT AERO; Inhale 2 puffs into the lungs 2 (two) times daily. -     Ambulatory referral to Pulmonology -     Discontinue: predniSONE (DELTASONE) 20 MG tablet; 2 po at sametime daily for 5 days- start tomorrow -     methylPREDNISolone acetate (DEPO-MEDROL) injection 40 mg   Non-seasonal allergic rhinitis due to pollen Restart medications as prescribed. Referral to allergist placed.  -     cetirizine (ZYRTEC) 10 MG tablet; Take 1 tablet (10 mg total) by mouth daily. -     Ambulatory referral to Allergy       Continue all other maintenance medications.   Follow up plan: Return if symptoms worsen or fail to improve.     Continue healthy lifestyle choices, including diet (rich in fruits, vegetables, and lean proteins, and low in salt and simple carbohydrates) and exercise (at least 30  minutes of moderate physical activity daily).   Educational handout given for asthma   The above assessment and management plan was discussed with the patient. The patient verbalized understanding of and has agreed to the management plan. Patient is aware to call the clinic if they develop any new symptoms or if symptoms persist or worsen. Patient is aware when to return to the clinic for a follow-up visit. Patient educated on when it is appropriate to go to the emergency department.    Kari Baars, FNP-C [facility] [phone]

## 2022-08-20 ENCOUNTER — Other Ambulatory Visit: Payer: Self-pay | Admitting: Family Medicine

## 2022-08-20 DIAGNOSIS — I10 Essential (primary) hypertension: Secondary | ICD-10-CM

## 2022-08-26 ENCOUNTER — Other Ambulatory Visit: Payer: Self-pay | Admitting: Family Medicine

## 2022-08-26 DIAGNOSIS — K644 Residual hemorrhoidal skin tags: Secondary | ICD-10-CM

## 2022-09-05 ENCOUNTER — Ambulatory Visit: Payer: BC Managed Care – PPO | Admitting: Family Medicine

## 2022-09-11 ENCOUNTER — Ambulatory Visit: Payer: BC Managed Care – PPO | Admitting: Family Medicine

## 2022-09-11 ENCOUNTER — Encounter: Payer: Self-pay | Admitting: Family Medicine

## 2022-09-11 VITALS — BP 125/83 | HR 75 | Temp 97.9°F | Resp 20 | Ht 63.0 in | Wt 225.0 lb

## 2022-09-11 DIAGNOSIS — Z8249 Family history of ischemic heart disease and other diseases of the circulatory system: Secondary | ICD-10-CM

## 2022-09-11 DIAGNOSIS — E782 Mixed hyperlipidemia: Secondary | ICD-10-CM | POA: Diagnosis not present

## 2022-09-11 DIAGNOSIS — I10 Essential (primary) hypertension: Secondary | ICD-10-CM | POA: Diagnosis not present

## 2022-09-11 LAB — CMP14+EGFR

## 2022-09-11 LAB — CBC WITH DIFFERENTIAL/PLATELET
Basophils Absolute: 0.1 10*3/uL (ref 0.0–0.2)
Basos: 1 %
Eos: 2 %
Immature Granulocytes: 0 %
Lymphocytes Absolute: 2.8 10*3/uL (ref 0.7–3.1)
MCH: 26 pg — ABNORMAL LOW (ref 26.6–33.0)
MCHC: 32.4 g/dL (ref 31.5–35.7)
Neutrophils: 52 %
RDW: 13.8 % (ref 11.7–15.4)
WBC: 7.6 10*3/uL (ref 3.4–10.8)

## 2022-09-11 LAB — LIPID PANEL

## 2022-09-11 LAB — LDL CHOLESTEROL, DIRECT

## 2022-09-11 LAB — THYROID PANEL WITH TSH

## 2022-09-11 NOTE — Progress Notes (Signed)
Subjective:  Patient ID: Cynthia [name], female    DOB: 05-11-1965, 57 y.o.   MRN: [id number]  Patient Care Team: Sonny Masters, FNP as PCP - General (Family Medicine) Serena Colonel, MD as Consulting Physician (Otolaryngology)   Chief Complaint:  Wants labwork (2 nephews had heart attacks and she is worried/)   HPI: Cynthia [name] is a 57 y.o. female presenting on 09/11/2022 for family history of MI and CHF. Pt requests labwork (2 nephews had heart attacks and she is worried/)  Pt denies any CP or discomfort at this time or in last several days but is worried about family cardiac history and wants to be assessed by cardiology  Pt CO intermittant SOB last episode a week ago with weird feeling that comes and goes. Denies any discomfort today. States that Its not indigestion or gas. Sharp in nature mid chest. Pt wants to seen by an cardiologist .  Recently 5 y/o Nephew had cardiac arrest and defibrillatoron cruise ship. ROSC and 3 stents and wears a loop monitor/defibrillator. Last months other nephew heart attach at age 25. Pt want to be seen and evaluated by cardiologist   1. Family history of myocardial infarction Two nephews with history of MI.    2. Family history of heart failure Mother CHF Father HTN, CVA, MI, and kidney failure with kidney transplant     Relevant past medical, surgical, family, and social history reviewed and updated as indicated.  Allergies and medications reviewed and updated. Data reviewed: Chart in Epic.   Past Medical History:  Diagnosis Date   Allergy    High cholesterol    Hypertension    Migraine    Vertigo     Past Surgical History:  Procedure Laterality Date   ABDOMINAL HYSTERECTOMY     BREAST BIOPSY Left 2018   BREAST EXCISIONAL BIOPSY Left    BREAST SURGERY Left    lump removed    CESAREAN SECTION     x2   CYST REMOVAL LEG     FRACTURE SURGERY Right    Arm   TONSILLECTOMY      Social History   Socioeconomic  History   Marital status: Married    Spouse name: Not on file   Number of children: Not on file   Years of education: Not on file   Highest education level: Not on file  Occupational History   Not on file  Tobacco Use   Smoking status: Former    Packs/day: 0.50    Years: 10.00    Additional pack years: 0.00    Total pack years: 5.00    Types: Cigarettes    Quit date: 06/04/2016    Years since quitting: 6.2   Smokeless tobacco: Never  Vaping Use   Vaping Use: Never used  Substance and Sexual Activity   Alcohol use: Yes    Comment: occ   Drug use: No   Sexual activity: Not Currently    Partners: Male    Comment: Married  Other Topics Concern   Not on file  Social History Narrative   Lives at home w/ her husband   Left-handed   Caffeine: 2-3 sodas per day   Social Determinants of Health   Financial Resource Strain: Not on file  Food Insecurity: Not on file  Transportation Needs: Not on file  Physical Activity: Not on file  Stress: Not on file  Social Connections: Not on file  Intimate Partner Violence: Not on file  Outpatient Encounter Medications as of 09/11/2022  Medication Sig   Albuterol-Budesonide (AIRSUPRA) 90-80 MCG/ACT AERO Inhale 2 Inhalations into the lungs once as needed for up to 1 dose (MAX 6 doses (12 inhalations) per 24 hours).   amLODipine (NORVASC) 10 MG tablet TAKE 1 TABLET BY MOUTH EVERY DAY   atorvastatin (LIPITOR) 20 MG tablet TAKE 1 TABLET BY MOUTH EVERY DAY   Budeson-Glycopyrrol-Formoterol (BREZTRI AEROSPHERE) 160-9-4.8 MCG/ACT AERO Inhale 2 puffs into the lungs 2 (two) times daily.   cetirizine (ZYRTEC) 10 MG tablet Take 1 tablet (10 mg total) by mouth daily.   fluticasone (FLONASE) 50 MCG/ACT nasal spray Place 2 sprays into both nostrils daily.   hydrochlorothiazide (HYDRODIURIL) 25 MG tablet TAKE 1 TABLET (25 MG TOTAL) BY MOUTH DAILY.   levalbuterol (XOPENEX HFA) 45 MCG/ACT inhaler Inhale 2 puffs into the lungs every 6 (six) hours as needed  for wheezing or shortness of breath (to REPLACE albuterol).   montelukast (SINGULAIR) 10 MG tablet Take 1 tablet (10 mg total) by mouth at bedtime.   No facility-administered encounter medications on file as of 09/11/2022.    No Known Allergies  Review of Systems  Constitutional: Negative.   HENT: Negative.    Eyes: Negative.   Respiratory:  Positive for cough (Intermittany worse in mornings. Dosesnt last  more than a few coughs). Negative for shortness of breath.   Cardiovascular:  Positive for palpitations (last week had Intermittant rfluttering center of chest. not linked to any event activity, or time of day. See HPIoccasionally shartp esting makes better). Negative for chest pain and leg swelling.  Gastrointestinal: Negative.   Endocrine: Negative.   Genitourinary: Negative.   Musculoskeletal: Negative.   Skin: Negative.   Allergic/Immunologic: Negative.   Neurological: Negative.   Hematological: Negative.   Psychiatric/Behavioral: Negative.    All other systems reviewed and are negative.       Objective:  BP 125/83   Pulse 75   Temp 97.9 F (36.6 C) (Temporal)   Resp 20   Ht 5\' 3"  (1.6 m)   Wt 225 lb (102.1 kg)   SpO2 95%   BMI 39.86 kg/m    Wt Readings from Last 3 Encounters:  09/11/22 225 lb (102.1 kg)  08/17/22 227 lb 9.6 oz (103.2 kg)  07/12/22 227 lb 9.6 oz (103.2 kg)    Physical Exam Vitals and nursing note reviewed.  Constitutional:      General: She is not in acute distress.    Appearance: Normal appearance. She is well-developed and well-groomed. She is obese. She is not ill-appearing, toxic-appearing or diaphoretic.  HENT:     Head: Normocephalic and atraumatic.     Jaw: There is normal jaw occlusion.     Right Ear: Hearing normal.     Left Ear: Hearing normal.     Nose: Nose normal.     Mouth/Throat:     Lips: Pink.     Mouth: Mucous membranes are moist.     Pharynx: Oropharynx is clear. Uvula midline.  Eyes:     General: Lids are normal.      Extraocular Movements: Extraocular movements intact.     Conjunctiva/sclera: Conjunctivae normal.     Pupils: Pupils are equal, round, and reactive to light.  Neck:     Thyroid: No thyroid mass, thyromegaly or thyroid tenderness.     Vascular: No carotid bruit or JVD.     Trachea: Trachea and phonation normal.  Cardiovascular:     Rate and Rhythm: Normal rate  and regular rhythm.     Chest Wall: PMI is not displaced.     Pulses: Normal pulses.     Heart sounds: Normal heart sounds. No murmur heard.    No friction rub. No gallop.  Pulmonary:     Effort: Pulmonary effort is normal. No respiratory distress.     Breath sounds: Normal breath sounds. No wheezing, rhonchi or rales.  Chest:     Chest wall: No tenderness.  Abdominal:     General: Bowel sounds are normal. There is no distension or abdominal bruit.     Palpations: Abdomen is soft. There is no hepatomegaly or splenomegaly.     Tenderness: There is no abdominal tenderness. There is no right CVA tenderness or left CVA tenderness.     Hernia: No hernia is present.  Musculoskeletal:        General: Normal range of motion.     Cervical back: Normal range of motion and neck supple.     Right lower leg: No edema.     Left lower leg: No edema.  Lymphadenopathy:     Cervical: No cervical adenopathy.  Skin:    General: Skin is warm and dry.     Capillary Refill: Capillary refill takes less than 2 seconds.     Coloration: Skin is not cyanotic, jaundiced or pale.     Findings: No rash.  Neurological:     General: No focal deficit present.     Mental Status: She is alert and oriented to person, place, and time.     Sensory: Sensation is intact.     Motor: Motor function is intact.     Coordination: Coordination is intact.     Gait: Gait is intact.     Deep Tendon Reflexes: Reflexes are normal and symmetric.  Psychiatric:        Attention and Perception: Attention and perception normal.        Mood and Affect: Affect normal.  Mood is anxious.        Speech: Speech normal.        Behavior: Behavior normal. Behavior is cooperative.        Thought Content: Thought content normal.        Cognition and Memory: Cognition and memory normal.        Judgment: Judgment normal.     Comments: Seems mildly anxious regarding family cardiac history and implications for her health     Results for orders placed or performed in visit on 08/28/21  Novel Coronavirus, NAA ([facility])   Specimen: Nasopharyngeal(NP) swabs in vial transport medium  Result Value Ref Range   SARS-CoV-2, NAA Not Detected Not Detected     EKG: SR with incomplete BBB, no acute ST-T changes, no ectopy, no prior for comparison. Kari Baars, FNP-C  Pertinent labs & imaging results that were available during my care of the patient were reviewed by me and considered in my medical decision making.  Assessment & Plan:  Maleigha was seen today for  lab work and cardiology consult secondary to family  Family history of myocardial infarction, Family history of heart failure  Diagnoses and all orders for this visit: Obesity, Family history of myocardial infarction, Family history of heart failure  Hyperlipidemia Diet encouraged - increase intake of fresh fruits and vegetables, increase intake of lean proteins. Bake, broil, or grill foods. Avoid fried, greasy, and fatty foods. Avoid fast foods. Increase intake of fiber-rich whole grains. Exercise encouraged - at least 150 minutes per  week and advance as tolerated.  Goal BMI < 25. Continue medications as prescribed. Follow up in 3-6 months as discussed.    Essential HTN BP well controlled. Changes were not  made in regimen. Goal BP is 130/80. Pt aware to report any persistent high or low readings. DASH diet and exercise encouraged. Exercise at least 150 minutes per week and increase as tolerated. Goal BMI > 25. Stress management encouraged. Avoid nicotine and tobacco product use. Avoid excessive alcohol and NSAID's.  Avoid more than 2000 mg of sodium daily. Medications as prescribed. Follow up as scheduled.     12 leadEKG  Labs Drawn today BMET, Lipids, BNP, CBC Continue all maintenance medications.  Follow up plan: Cardiology consult  Return to clinic with concerns but if active chest pain call 911 or gotomergency department. Seek medical attention immediatly.   Continue healthy lifestyle choices, including diet (rich in fruits, vegetables, and lean proteins, and low in salt and simple carbohydrates) and exercise (at least 30 minutes of moderate physical activity daily).  Educational handout given for DASH diet and HTN  The above assessment and management plan was discussed with the patient. The patient verbalized understanding of and has agreed to the management plan. Patient is aware to call the clinic if they develop any new symptoms or if symptoms persist or worsen. Patient is aware when to return to the clinic for a follow-up visit. Patient educated on when it is appropriate to go to the emergency department.   Maryelizabeth Kaufmann AGNP student [facility] [phone]   I personally was present during the history, physical exam, and medical decision-making activities of this visit and have verified that the services and findings are accurately documented in the nurse practitioner student's note.  Kari Baars, FNP-C [facility] [address] [place], [place] [address] [phone]

## 2022-09-11 NOTE — Patient Instructions (Signed)

## 2022-09-12 LAB — CBC WITH DIFFERENTIAL/PLATELET
EOS (ABSOLUTE): 0.1 10*3/uL (ref 0.0–0.4)
Hematocrit: 38.6 % (ref 34.0–46.6)
Hemoglobin: 12.5 g/dL (ref 11.1–15.9)
Immature Grans (Abs): 0 10*3/uL (ref 0.0–0.1)
Lymphs: 37 %
MCV: 80 fL (ref 79–97)
Monocytes Absolute: 0.6 10*3/uL (ref 0.1–0.9)
Monocytes: 8 %
Neutrophils Absolute: 4.1 10*3/uL (ref 1.4–7.0)
Platelets: 404 10*3/uL (ref 150–450)
RBC: 4.81 x10E6/uL (ref 3.77–5.28)

## 2022-09-12 LAB — CMP14+EGFR
ALT: 22 IU/L (ref 0–32)
Albumin/Globulin Ratio: 1.5 (ref 1.2–2.2)
BUN/Creatinine Ratio: 14 (ref 9–23)
BUN: 12 mg/dL (ref 6–24)
Bilirubin Total: 0.4 mg/dL (ref 0.0–1.2)
Calcium: 9.8 mg/dL (ref 8.7–10.2)
Potassium: 3.7 mmol/L (ref 3.5–5.2)
Sodium: 140 mmol/L (ref 134–144)
Total Protein: 6.7 g/dL (ref 6.0–8.5)
eGFR: 80 mL/min/{1.73_m2} (ref >59)

## 2022-09-12 LAB — THYROID PANEL WITH TSH
T3 Uptake Ratio: 26 % (ref 24–39)
T4, Total: 7.3 ug/dL (ref 4.5–12.0)
TSH: 0.908 u[IU]/mL (ref 0.450–4.500)

## 2022-09-12 LAB — LIPID PANEL
Cholesterol, Total: 156 mg/dL (ref 100–199)
Triglycerides: 120 mg/dL (ref 0–149)

## 2022-09-12 LAB — BRAIN NATRIURETIC PEPTIDE: BNP: 4.6 pg/mL (ref 0.0–100.0)

## 2022-09-17 ENCOUNTER — Other Ambulatory Visit: Payer: Self-pay | Admitting: Family Medicine

## 2022-09-17 DIAGNOSIS — E7800 Pure hypercholesterolemia, unspecified: Secondary | ICD-10-CM

## 2022-09-17 DIAGNOSIS — I10 Essential (primary) hypertension: Secondary | ICD-10-CM

## 2022-09-19 IMAGING — US US BREAST BX W LOC DEV 1ST LESION IMG BX SPEC US GUIDE*L*
1 series · 11 of 11 positions shown · non-contrast
Comparison: Previous exam(s).
COMPARISON: Previous exam(s).

Addendum:
CLINICAL DATA: Patient presents for ultrasound-guided core biopsy
of LEFT breast mass. Patient has enlarging LEFT breast mass,
previously biopsied on 09/10/2016
demonstrating PASH, UDH, and fibrocystic changes.

EXAM:
ULTRASOUND GUIDED LEFT BREAST CORE NEEDLE BIOPSY

[Series 1: us breast bx w loc dev 1st lesion img bx spec us g · 0.06mm/px · 11 of 11 slices shown]
[im 1/11]
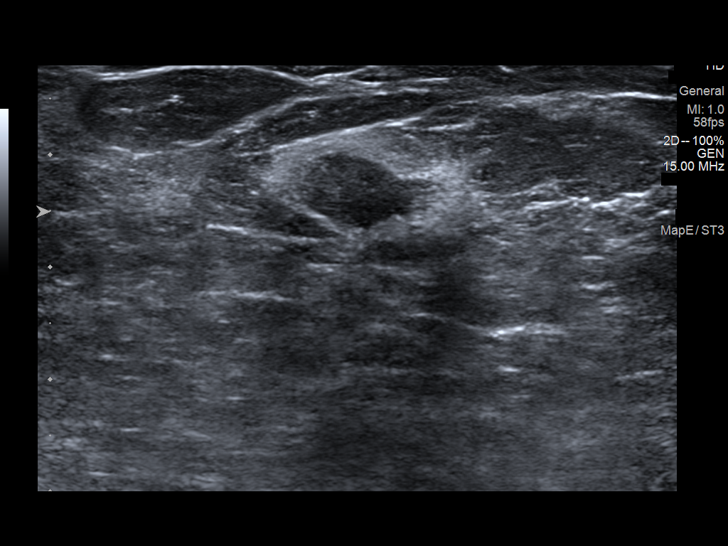
[im 2/11]
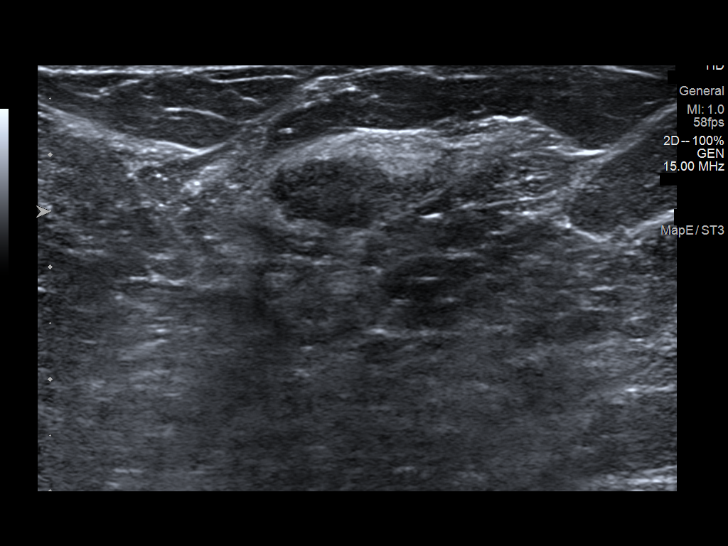
[im 3/11]
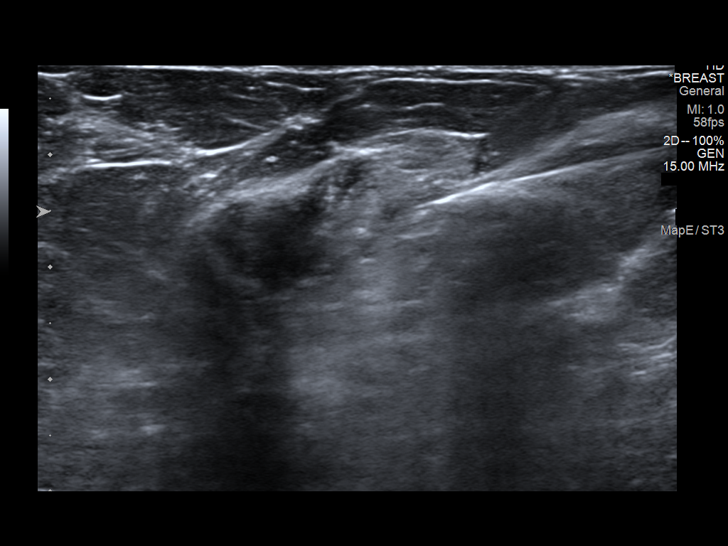
[im 4/11]
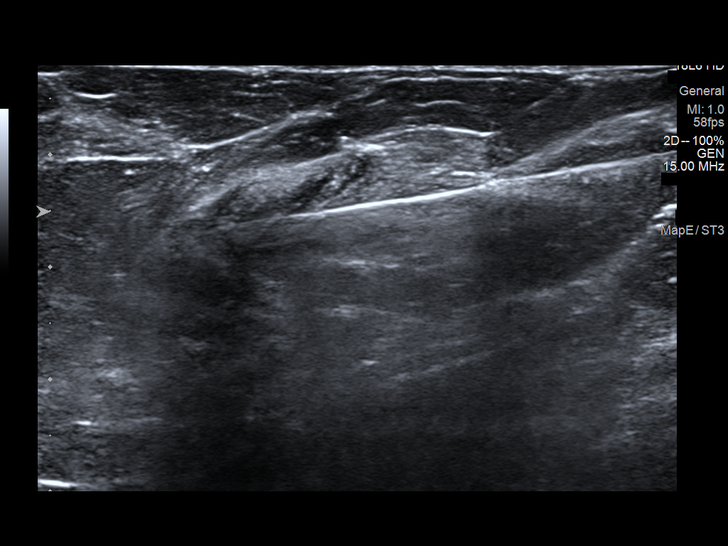
[im 5/11]
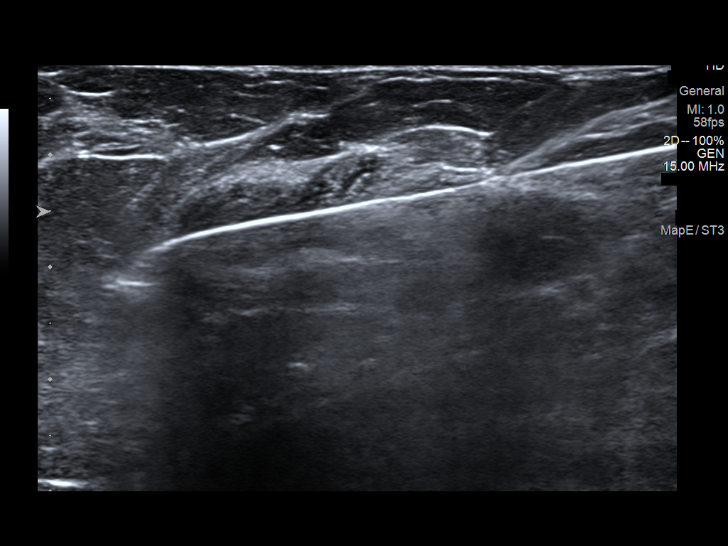
[im 6/11]
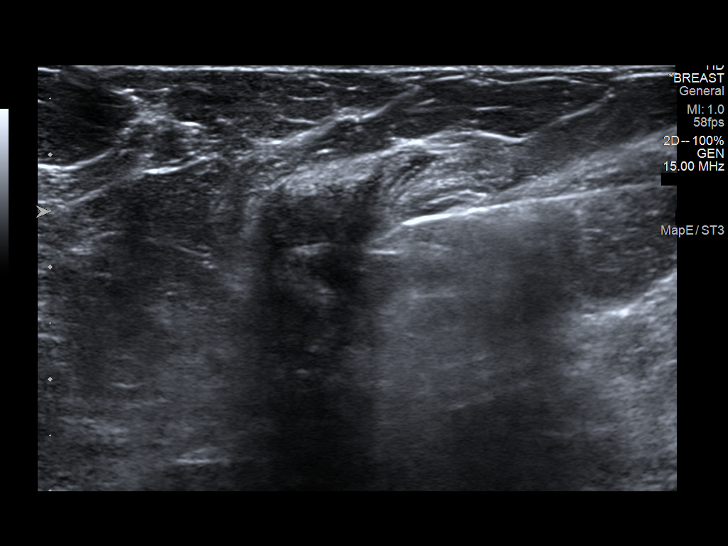
[im 7/11]
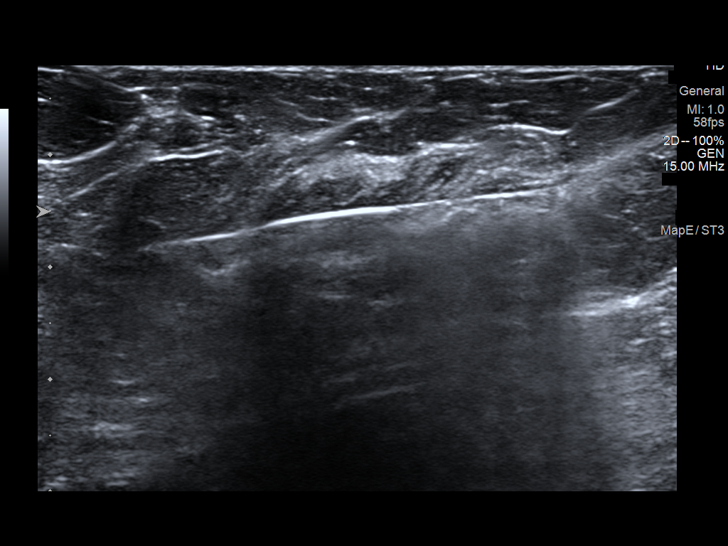
[im 8/11]
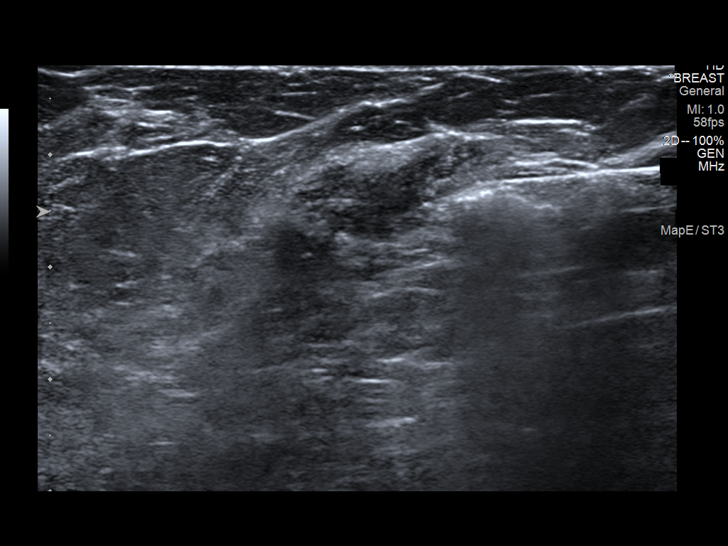
[im 9/11]
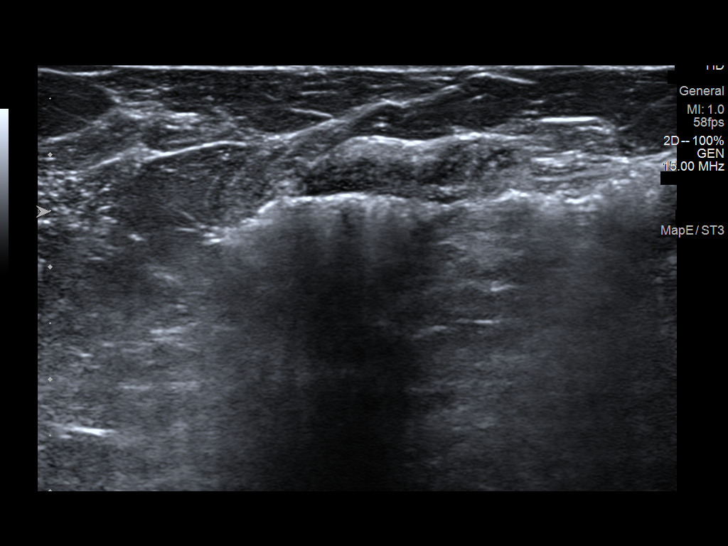
[im 10/11]
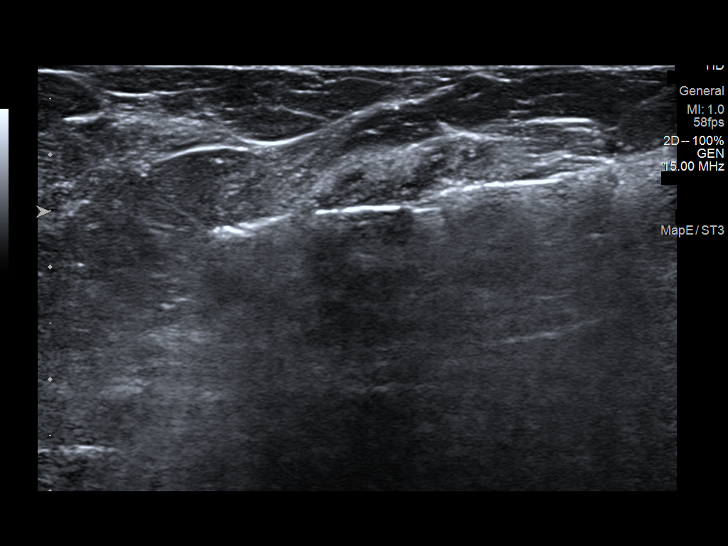
[im 11/11]
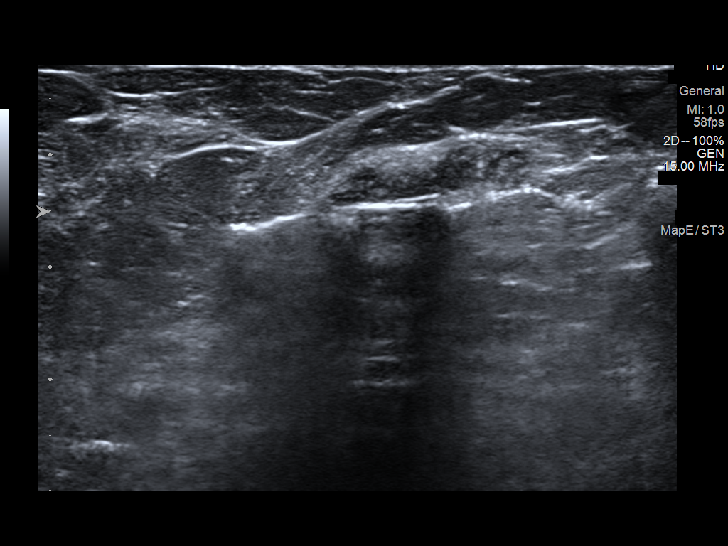

[11 of 11 positions shown; findings below may reference images not displayed]



Lesion quadrant: LOWER OUTER QUADRANT LEFT breast

Using sterile technique and 1% Lidocaine as local anesthetic, under
direct ultrasound visualization, a 12 gauge Sinha device was
used to perform biopsy of mass in the LOWER OUTER QUADRANT of LEFT
breast using a LATERAL approach. At the conclusion of the procedure
heart tissue marker clip was deployed into the biopsy cavity. Follow
up 2 view mammogram was performed and dictated separately.
IMPRESSION: Ultrasound guided biopsy of LEFT breast mass. No apparent
complications.

ADDENDUM:
Pathology revealed FIBROADENOMA of the LEFT breast, 4 o'clock,
1cmfn, (heart clip). This was found to be concordant by Dr.
Otilus Tidey.

Pathology results were discussed with the patient by telephone. The
patient reported doing well after the biopsy with tenderness at the
site. Post biopsy instructions and care were reviewed and questions
were answered. The patient was encouraged to call The [REDACTED] for any additional concerns. My direct phone
number was provided.

The patient was instructed to return for annual screening
mammography in June 2022.

Pathology results reported by Hofik Joby, RN on 07/18/2021.



Lesion quadrant: LOWER OUTER QUADRANT LEFT breast

Using sterile technique and 1% Lidocaine as local anesthetic, under
direct ultrasound visualization, a 12 gauge Sinha device was
used to perform biopsy of mass in the LOWER OUTER QUADRANT of LEFT
breast using a LATERAL approach. At the conclusion of the procedure
heart tissue marker clip was deployed into the biopsy cavity. Follow
up 2 view mammogram was performed and dictated separately.
IMPRESSION: Ultrasound guided biopsy of LEFT breast mass. No apparent
complications.

## 2022-09-19 IMAGING — MG MM BREAST LOCALIZATION CLIP
4 series · 4 of 12 positions shown · non-contrast
Comparison: Previous exam(s).

CLINICAL DATA: Status post ultrasound-guided core biopsy of LEFT
breast mass.

EXAM:
3D DIAGNOSTIC LEFT MAMMOGRAM POST ULTRASOUND BIOPSY

[L ML synth-2D]
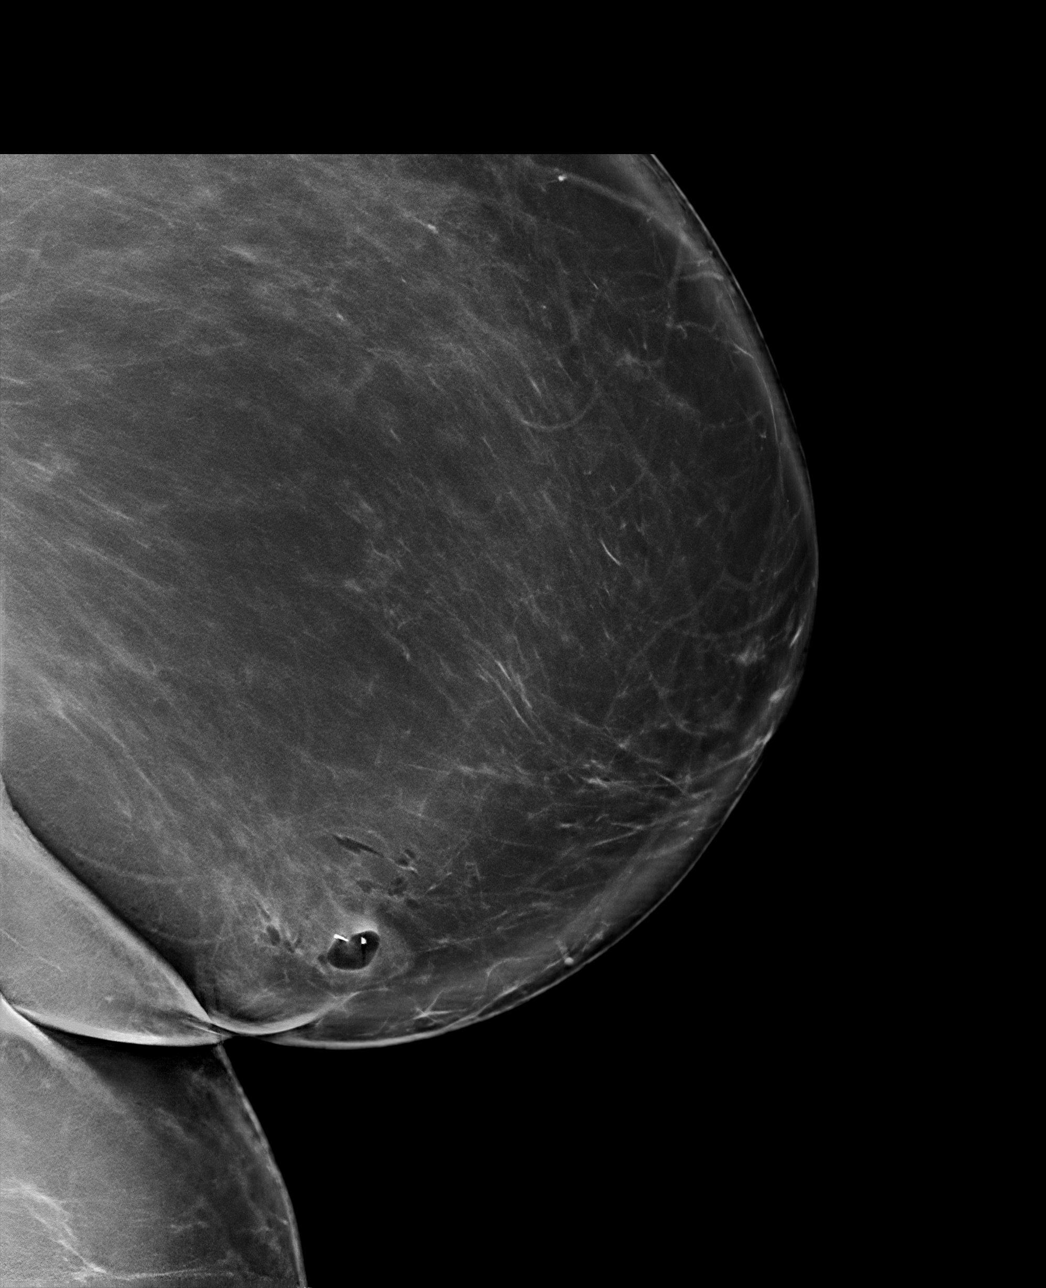

[L CC synth-2D]
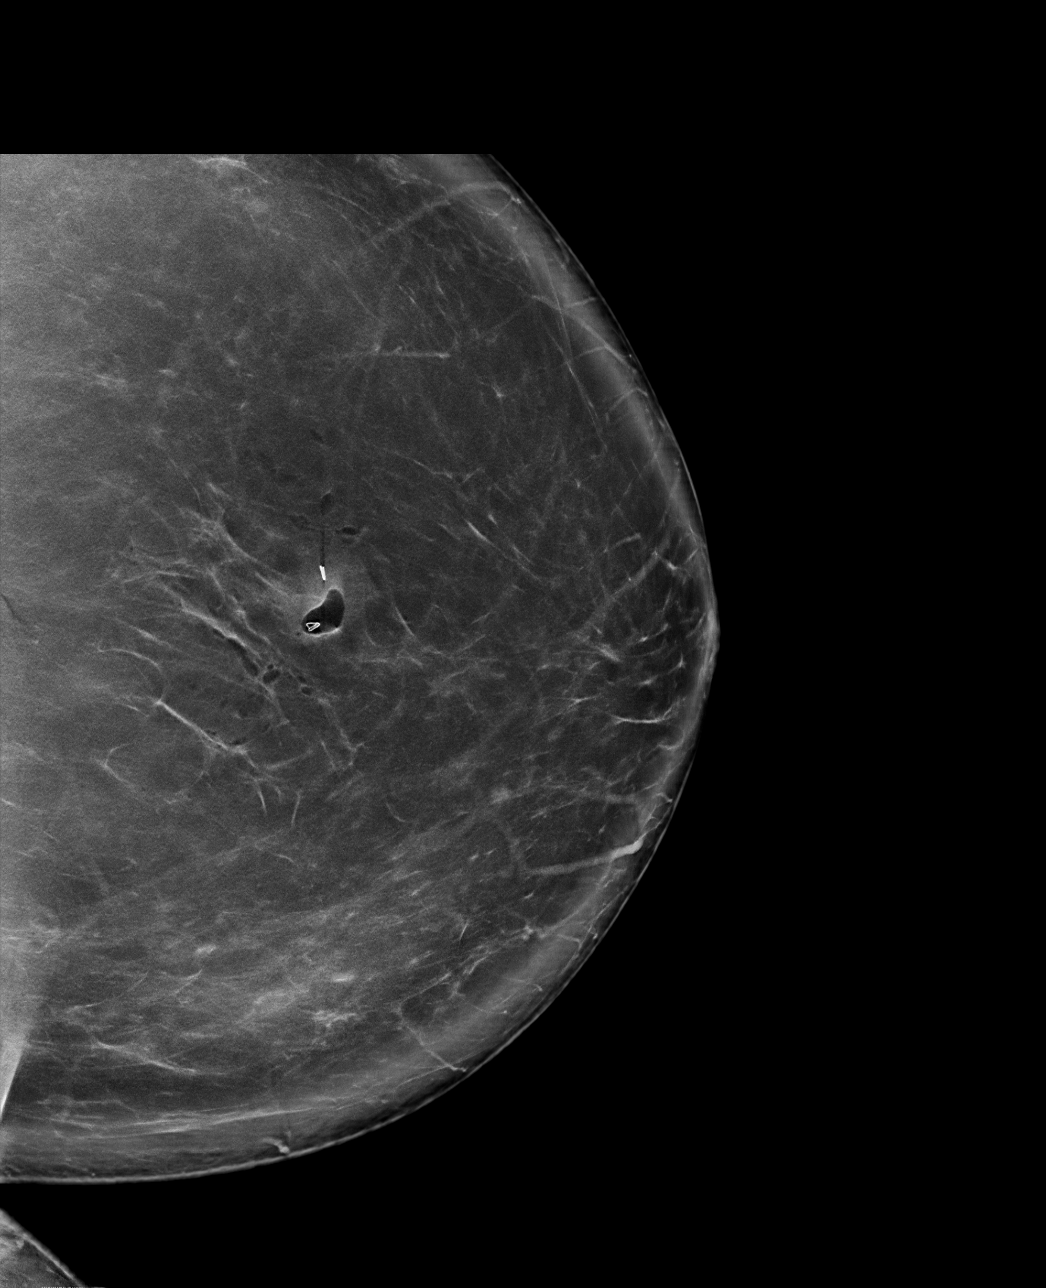

[L CC tomo · tomo slice 61/120.0]
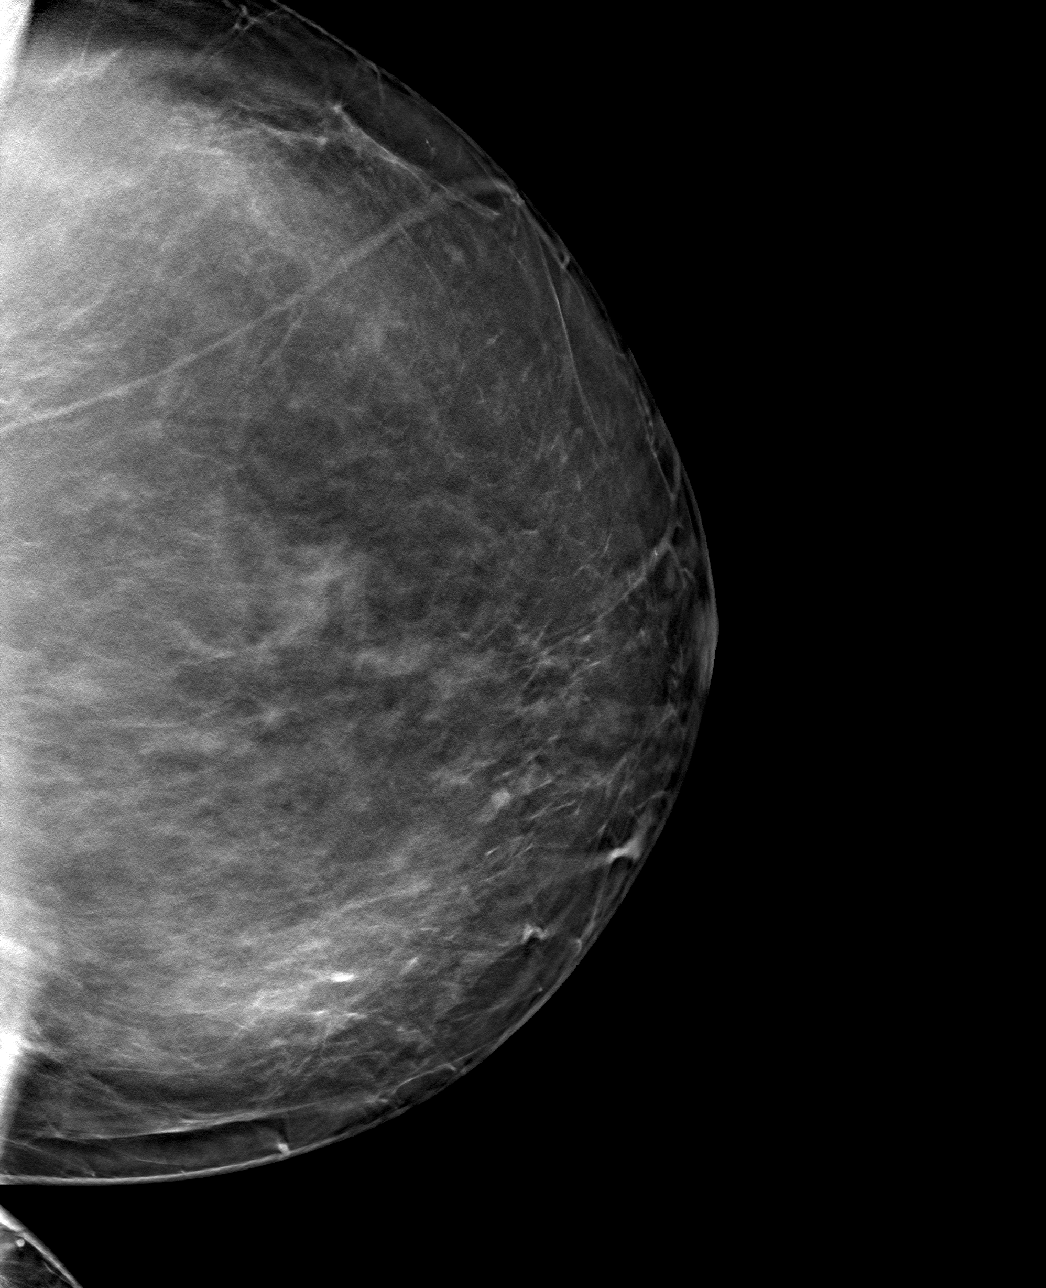

[L ML tomo · tomo slice 61/122.0]
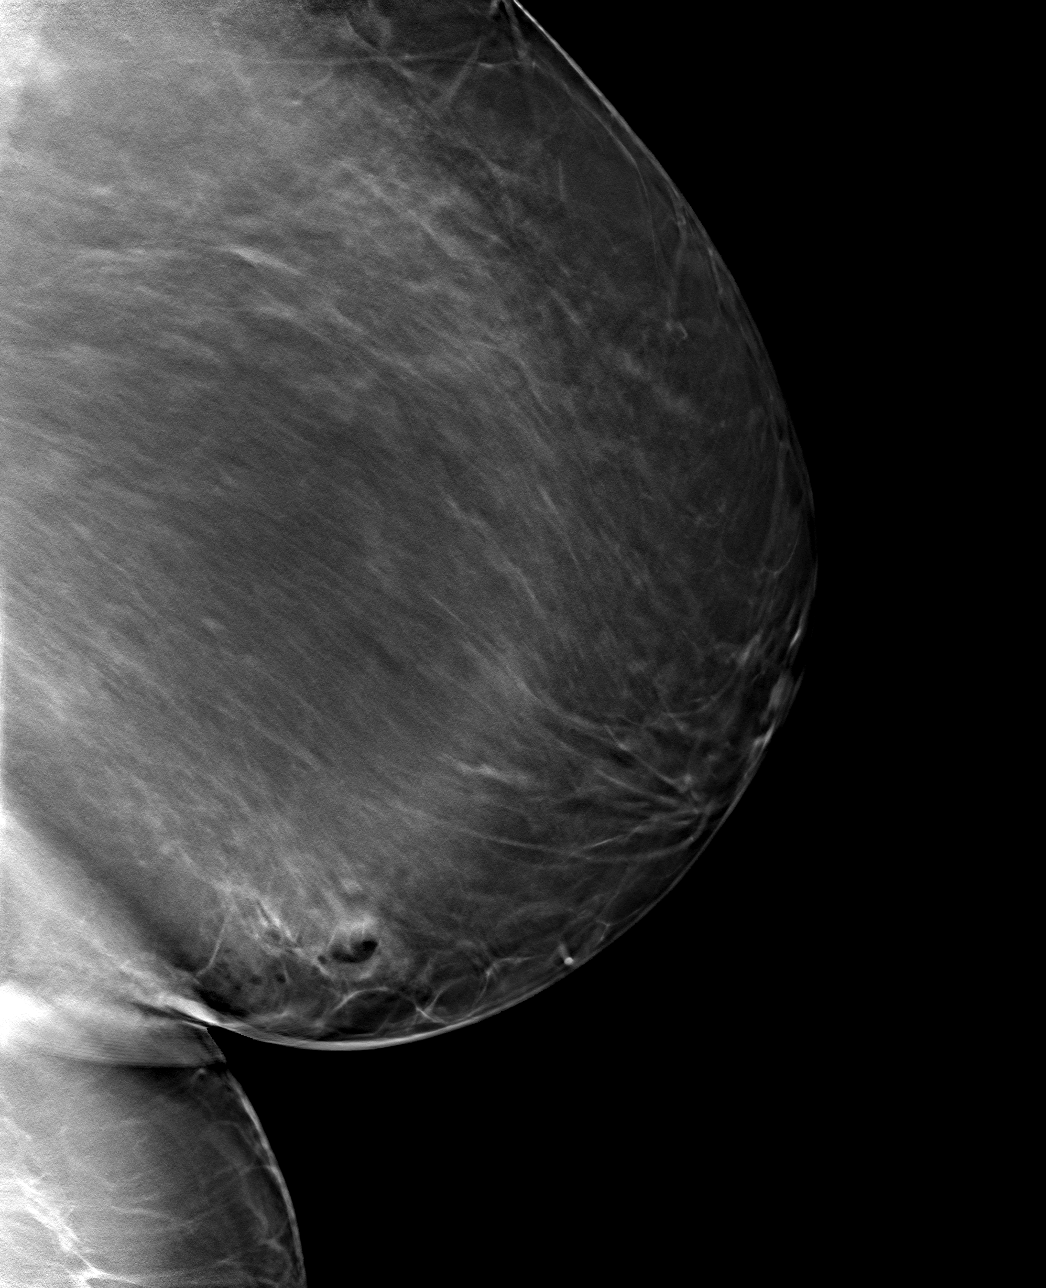

[4 of 12 positions shown; findings below may reference images not displayed]

FINDINGS: 3D Mammographic images were obtained following ultrasound guided
biopsy of mass in the 4 o'clock location of the LEFT breast and
placement of a heart shaped. The biopsy marking clip is in expected
position at the site of biopsy. The heart shaped clip is adjacent to
a ribbon shaped clip placed at the time previous biopsy performed
09/10/2016
IMPRESSION: Appropriate positioning of the heart shaped biopsy marking clip at
the site of biopsy in the mass in the LOWER OUTER QUADRANT LEFT
breast.

Final Assessment: Post Procedure Mammograms for Marker Placement

## 2022-10-31 ENCOUNTER — Ambulatory Visit: Payer: BC Managed Care – PPO | Admitting: Internal Medicine

## 2022-11-06 ENCOUNTER — Ambulatory Visit: Payer: BC Managed Care – PPO | Admitting: Allergy

## 2022-11-22 ENCOUNTER — Other Ambulatory Visit: Payer: Self-pay | Admitting: Family Medicine

## 2022-11-23 ENCOUNTER — Ambulatory Visit (INDEPENDENT_AMBULATORY_CARE_PROVIDER_SITE_OTHER): Payer: BC Managed Care – PPO | Admitting: Family Medicine

## 2022-11-23 ENCOUNTER — Encounter: Payer: Self-pay | Admitting: Family Medicine

## 2022-11-23 VITALS — BP 117/76 | HR 97 | Temp 97.7°F | Ht 63.0 in | Wt 228.6 lb

## 2022-11-23 DIAGNOSIS — R197 Diarrhea, unspecified: Secondary | ICD-10-CM | POA: Diagnosis not present

## 2022-11-23 DIAGNOSIS — K921 Melena: Secondary | ICD-10-CM

## 2022-11-23 DIAGNOSIS — J069 Acute upper respiratory infection, unspecified: Secondary | ICD-10-CM | POA: Diagnosis not present

## 2022-11-23 NOTE — Progress Notes (Signed)
Subjective:  Patient ID: Cynthia [name], female    DOB: [birth year], 57 y.o.   MRN: [id number]  Patient Care Team: Sonny Masters, FNP as PCP - General (Family Medicine) Serena Colonel, MD as Consulting Physician (Otolaryngology)   Chief Complaint:  Cough, Nasal Congestion, and Diarrhea (X 3 days with black stool )   HPI: Cynthia [name] is a 57 y.o. female presenting on 11/23/2022 for Cough, Nasal Congestion, and Diarrhea (X 3 days with black stool )   Cough This is a recurrent problem. The current episode started more than 1 month ago. The problem has been waxing and waning. The problem occurs every few minutes. The cough is Non-productive. Associated symptoms include headaches, nasal congestion, postnasal drip, rhinorrhea and wheezing. Pertinent negatives include no chest pain, chills, ear congestion, ear pain, fever, heartburn, hemoptysis, myalgias, rash, sore throat, shortness of breath, sweats or weight loss. Nothing aggravates the symptoms. She has tried leukotriene antagonists, OTC cough suppressant, a beta-agonist inhaler and steroid inhaler for the symptoms. The treatment provided mild relief. Her past medical history is significant for asthma and bronchitis.  Diarrhea  This is a new problem. The current episode started in the past 7 days. The problem occurs 2 to 4 times per day. The problem has been waxing and waning. The stool consistency is described as Bloody. Associated symptoms include abdominal pain, coughing and headaches. Pertinent negatives include no arthralgias, bloating, chills, fever, increased  flatus, myalgias, sweats, URI, vomiting or weight loss. Nothing aggravates the symptoms. There are no known risk factors. Treatments tried: Pepto. The treatment provided no relief.       Relevant past medical, surgical, family, and social history reviewed and updated as indicated.  Allergies and medications reviewed and updated. Data reviewed: Chart in Epic.   Past  Medical History:  Diagnosis Date   Allergy    High cholesterol    Hypertension    Migraine    Vertigo     Past Surgical History:  Procedure Laterality Date   ABDOMINAL HYSTERECTOMY     BREAST BIOPSY Left 2018   BREAST EXCISIONAL BIOPSY Left    BREAST SURGERY Left    lump removed    CESAREAN SECTION     x2   CYST REMOVAL LEG     FRACTURE SURGERY Right    Arm   TONSILLECTOMY      Social History   Socioeconomic History   Marital status: Married    Spouse name: Not on file   Number of children: Not on file   Years of education: Not on file   Highest education level: Not on file  Occupational History   Not on file  Tobacco Use   Smoking status: Former    Current packs/day: 0.00    Average packs/day: 0.5 packs/day for 10.0 years (5.0 ttl pk-yrs)    Types: Cigarettes    Start date: 06/04/2006    Quit date: 06/04/2016    Years since quitting: 6.4   Smokeless tobacco: Never  Vaping Use   Vaping status: Never Used  Substance and Sexual Activity   Alcohol use: Yes    Comment: occ   Drug use: No   Sexual activity: Not Currently    Partners: Male    Comment: Married  Other Topics Concern   Not on file  Social History Narrative   Lives at home w/ her husband   Left-handed   Caffeine: 2-3 sodas per day   Social Determinants of Health  Financial Resource Strain: Not on file  Food Insecurity: Not on file  Transportation Needs: Not on file  Physical Activity: Not on file  Stress: Not on file  Social Connections: Not on file  Intimate Partner Violence: Not on file    Outpatient Encounter Medications as of 11/23/2022  Medication Sig   Albuterol-Budesonide (AIRSUPRA) 90-80 MCG/ACT AERO Inhale 2 Inhalations into the lungs once as needed for up to 1 dose (MAX 6 doses (12 inhalations) per 24 hours).   amLODipine (NORVASC) 10 MG tablet TAKE 1 TABLET BY MOUTH EVERY DAY   atorvastatin (LIPITOR) 20 MG tablet TAKE 1 TABLET BY MOUTH EVERY DAY    Budeson-Glycopyrrol-Formoterol (BREZTRI AEROSPHERE) 160-9-4.8 MCG/ACT AERO Inhale 2 puffs into the lungs 2 (two) times daily.   cetirizine (ZYRTEC) 10 MG tablet Take 1 tablet (10 mg total) by mouth daily.   fluticasone (FLONASE) 50 MCG/ACT nasal spray Place 2 sprays into both nostrils daily.   hydrochlorothiazide (HYDRODIURIL) 25 MG tablet TAKE 1 TABLET (25 MG TOTAL) BY MOUTH DAILY.   levalbuterol (XOPENEX HFA) 45 MCG/ACT inhaler Inhale 2 puffs into the lungs every 6 (six) hours as needed for wheezing or shortness of breath (to REPLACE albuterol).   montelukast (SINGULAIR) 10 MG tablet Take 1 tablet (10 mg total) by mouth at bedtime.   No facility-administered encounter medications on file as of 11/23/2022.    No Known Allergies  Review of Systems  Constitutional:  Positive for activity change, appetite change and fatigue. Negative for chills, diaphoresis, fever, unexpected weight change and weight loss.  HENT:  Positive for congestion, postnasal drip and rhinorrhea. Negative for dental problem, drooling, ear discharge, ear pain, facial swelling, hearing loss, mouth sores, nosebleeds, sinus pressure, sinus pain, sneezing, sore throat, tinnitus, trouble swallowing and voice change.   Respiratory:  Positive for cough and wheezing. Negative for apnea, hemoptysis, choking, chest tightness, shortness of breath and stridor.   Cardiovascular:  Negative for chest pain, palpitations and leg swelling.  Gastrointestinal:  Positive for abdominal pain, blood in stool, diarrhea and nausea. Negative for abdominal distention, anal bleeding, bloating, constipation, flatus, heartburn, rectal pain and vomiting.  Genitourinary:  Negative for decreased urine volume and difficulty urinating.  Musculoskeletal:  Negative for arthralgias and myalgias.  Skin:  Negative for rash.  Neurological:  Positive for headaches. Negative for dizziness, tremors, seizures, syncope, facial asymmetry, speech difficulty, weakness,  light-headedness and numbness.  Psychiatric/Behavioral:  Negative for confusion.   All other systems reviewed and are negative.       Objective:  BP 117/76   Pulse 97   Temp 97.7 F (36.5 C) (Temporal)   Ht 5\' 3"  (1.6 m)   Wt 228 lb 9.6 oz (103.7 kg)   SpO2 91%   BMI 40.49 kg/m    Wt Readings from Last 3 Encounters:  11/23/22 228 lb 9.6 oz (103.7 kg)  09/11/22 225 lb (102.1 kg)  08/17/22 227 lb 9.6 oz (103.2 kg)    Physical Exam Vitals and nursing note reviewed.  Constitutional:      General: She is not in acute distress.    Appearance: Normal appearance. She is well-developed and well-groomed. She is obese. She is not ill-appearing, toxic-appearing or diaphoretic.  HENT:     Head: Normocephalic and atraumatic.     Jaw: There is normal jaw occlusion.     Right Ear: Hearing, tympanic membrane, ear canal and external ear normal.     Left Ear: Hearing, tympanic membrane, ear canal and external ear normal.  Nose: Nose normal.     Mouth/Throat:     Lips: Pink.     Mouth: Mucous membranes are moist.     Pharynx: Oropharynx is clear. Uvula midline. Postnasal drip present. No pharyngeal swelling, oropharyngeal exudate, posterior oropharyngeal erythema or uvula swelling.  Eyes:     General: Lids are normal.     Extraocular Movements: Extraocular movements intact.     Conjunctiva/sclera: Conjunctivae normal.     Pupils: Pupils are equal, round, and reactive to light.  Neck:     Thyroid: No thyroid mass, thyromegaly or thyroid tenderness.     Vascular: No carotid bruit or JVD.     Trachea: Trachea and phonation normal.  Cardiovascular:     Rate and Rhythm: Normal rate and regular rhythm.     Chest Wall: PMI is not displaced.     Pulses: Normal pulses.     Heart sounds: Normal heart sounds. No murmur heard.    No friction rub. No gallop.  Pulmonary:     Effort: Pulmonary effort is normal. No respiratory distress.     Breath sounds: Normal breath sounds. No wheezing.   Abdominal:     General: Bowel sounds are normal. There is no distension or abdominal bruit.     Palpations: Abdomen is soft. There is no hepatomegaly or splenomegaly.     Tenderness: There is no abdominal tenderness. There is no right CVA tenderness or left CVA tenderness.     Hernia: No hernia is present.  Musculoskeletal:     Cervical back: Normal range of motion and neck supple.     Right lower leg: No edema.     Left lower leg: No edema.  Lymphadenopathy:     Cervical: No cervical adenopathy.  Skin:    General: Skin is warm and dry.     Capillary Refill: Capillary refill takes less than 2 seconds.     Coloration: Skin is not cyanotic, jaundiced or pale.     Findings: No rash.  Neurological:     General: No focal deficit present.     Mental Status: She is alert and oriented to person, place, and time.     Sensory: Sensation is intact.     Motor: Motor function is intact.     Coordination: Coordination is intact.     Gait: Gait is intact.     Deep Tendon Reflexes: Reflexes are normal and symmetric.  Psychiatric:        Attention and Perception: Attention and perception normal.        Mood and Affect: Mood and affect normal.        Speech: Speech normal.        Behavior: Behavior normal. Behavior is cooperative.        Thought Content: Thought content normal.        Cognition and Memory: Cognition and memory normal.        Judgment: Judgment normal.     Results for orders placed or performed in visit on 09/11/22  LDL Cholesterol, Direct  Result Value Ref Range   LDL Direct 87 0 - 99 mg/dL  Brain natriuretic peptide  Result Value Ref Range   BNP 4.6 0.0 - 100.0 pg/mL  CBC with Differential/Platelet  Result Value Ref Range   WBC 7.6 3.4 - 10.8 x10E3/uL   RBC 4.81 3.77 - 5.28 x10E6/uL   Hemoglobin 12.5 11.1 - 15.9 g/dL   Hematocrit 37.8 58.8 - 46.6 %   MCV 80 79 -  97 fL   MCH 26.0 (L) 26.6 - 33.0 pg   MCHC 32.4 31.5 - 35.7 g/dL   RDW 86.5 78.4 - 69.6 %   Platelets  404 150 - 450 x10E3/uL   Neutrophils 52 Not Estab. %   Lymphs 37 Not Estab. %   Monocytes 8 Not Estab. %   Eos 2 Not Estab. %   Basos 1 Not Estab. %   Neutrophils Absolute 4.1 1.4 - 7.0 x10E3/uL   Lymphocytes Absolute 2.8 0.7 - 3.1 x10E3/uL   Monocytes Absolute 0.6 0.1 - 0.9 x10E3/uL   EOS (ABSOLUTE) 0.1 0.0 - 0.4 x10E3/uL   Basophils Absolute 0.1 0.0 - 0.2 x10E3/uL   Immature Granulocytes 0 Not Estab. %   Immature Grans (Abs) 0.0 0.0 - 0.1 x10E3/uL  CMP14+EGFR  Result Value Ref Range   Glucose 88 70 - 99 mg/dL   BUN 12 6 - 24 mg/dL   Creatinine, Ser 2.95 0.57 - 1.00 mg/dL   eGFR 80 >28 UX/LKG/4.01   BUN/Creatinine Ratio 14 9 - 23   Sodium 140 134 - 144 mmol/L   Potassium 3.7 3.5 - 5.2 mmol/L   Chloride 101 96 - 106 mmol/L   CO2 26 20 - 29 mmol/L   Calcium 9.8 8.7 - 10.2 mg/dL   Total Protein 6.7 6.0 - 8.5 g/dL   Albumin 4.0 3.8 - 4.9 g/dL   Globulin, Total 2.7 1.5 - 4.5 g/dL   Albumin/Globulin Ratio 1.5 1.2 - 2.2   Bilirubin Total 0.4 0.0 - 1.2 mg/dL   Alkaline Phosphatase 125 (H) 44 - 121 IU/L   AST 19 0 - 40 IU/L   ALT 22 0 - 32 IU/L  Lipid panel  Result Value Ref Range   Cholesterol, Total 156 100 - 199 mg/dL   Triglycerides 027 0 - 149 mg/dL   HDL 44 >25 mg/dL   VLDL Cholesterol Cal 22 5 - 40 mg/dL   LDL Chol Calc (NIH) 90 0 - 99 mg/dL   Chol/HDL Ratio 3.5 0.0 - 4.4 ratio  Thyroid Panel With TSH  Result Value Ref Range   TSH 0.908 0.450 - 4.500 uIU/mL   T4, Total 7.3 4.5 - 12.0 ug/dL   T3 Uptake Ratio 26 24 - 39 %   Free Thyroxine Index 1.9 1.2 - 4.9       Pertinent labs & imaging results that were available during my care of the patient were reviewed by me and considered in my medical decision making.  Assessment & Plan:  Cynthia [name] was seen today for cough, nasal congestion and diarrhea.  Diagnoses and all orders for this visit:  Black stool Diarrhea in adult patient Will obtain labs today. Black colored stool could be due to pepto use. Will check fecal  occult as well. Referral to GI placed.  -     CBC with Differential/Platelet -     CMP14+EGFR -     Fecal occult blood, imunochemical; Future -     Ambulatory referral to Gastroenterology  URI with cough and congestion Improving. Will check for below. No indications of acute bacterial illness, continue current medications.  -     COVID-19, Flu A+B and RSV     Continue all other maintenance medications.  Follow up plan: Return if symptoms worsen or fail to improve.   Continue healthy lifestyle choices, including diet (rich in fruits, vegetables, and lean proteins, and low in salt and simple carbohydrates) and exercise (at least 30 minutes of moderate physical activity daily).  Educational handout given for diarrhea  The above assessment and management plan was discussed with the patient. The patient verbalized understanding of and has agreed to the management plan. Patient is aware to call the clinic if they develop any new symptoms or if symptoms persist or worsen. Patient is aware when to return to the clinic for a follow-up visit. Patient educated on when it is appropriate to go to the emergency department.   Kari Baars, FNP-C [facility] [phone]

## 2022-11-24 LAB — CMP14+EGFR
ALT: 11 IU/L (ref 0–32)
AST: 16 IU/L (ref 0–40)
Albumin: 3.9 g/dL (ref 3.8–4.9)
Alkaline Phosphatase: 117 IU/L (ref 44–121)
BUN/Creatinine Ratio: 11 (ref 9–23)
BUN: 9 mg/dL (ref 6–24)
Bilirubin Total: 0.3 mg/dL (ref 0.0–1.2)
CO2: 26 mmol/L (ref 20–29)
Calcium: 9.4 mg/dL (ref 8.7–10.2)
Chloride: 103 mmol/L (ref 96–106)
Creatinine, Ser: 0.79 mg/dL (ref 0.57–1.00)
Globulin, Total: 2.8 g/dL (ref 1.5–4.5)
Glucose: 82 mg/dL (ref 70–99)
Potassium: 3.3 mmol/L — ABNORMAL LOW (ref 3.5–5.2)
Sodium: 143 mmol/L (ref 134–144)
Total Protein: 6.7 g/dL (ref 6.0–8.5)
eGFR: 86 mL/min/{1.73_m2} (ref >59)

## 2022-11-24 LAB — CBC WITH DIFFERENTIAL/PLATELET
Basophils Absolute: 0 10*3/uL (ref 0.0–0.2)
Basos: 1 %
EOS (ABSOLUTE): 0.3 10*3/uL (ref 0.0–0.4)
Eos: 4 %
Hematocrit: 36.9 % (ref 34.0–46.6)
Hemoglobin: 11.7 g/dL (ref 11.1–15.9)
Immature Grans (Abs): 0 10*3/uL (ref 0.0–0.1)
Immature Granulocytes: 1 %
Lymphocytes Absolute: 2.2 10*3/uL (ref 0.7–3.1)
Lymphs: 34 %
MCH: 25.8 pg — ABNORMAL LOW (ref 26.6–33.0)
MCHC: 31.7 g/dL (ref 31.5–35.7)
MCV: 81 fL (ref 79–97)
Monocytes Absolute: 0.6 10*3/uL (ref 0.1–0.9)
Monocytes: 9 %
Neutrophils Absolute: 3.4 10*3/uL (ref 1.4–7.0)
Neutrophils: 51 %
Platelets: 384 10*3/uL (ref 150–450)
RBC: 4.54 x10E6/uL (ref 3.77–5.28)
RDW: 13.4 % (ref 11.7–15.4)
WBC: 6.5 10*3/uL (ref 3.4–10.8)

## 2022-11-27 ENCOUNTER — Other Ambulatory Visit: Payer: Self-pay | Admitting: *Deleted

## 2022-11-27 DIAGNOSIS — E876 Hypokalemia: Secondary | ICD-10-CM

## 2022-11-27 LAB — COVID-19, FLU A+B AND RSV
Influenza A, NAA: NOT DETECTED
Influenza B, NAA: NOT DETECTED
RSV, NAA: NOT DETECTED
SARS-CoV-2, NAA: NOT DETECTED

## 2022-12-04 ENCOUNTER — Other Ambulatory Visit: Payer: BC Managed Care – PPO

## 2022-12-04 DIAGNOSIS — K921 Melena: Secondary | ICD-10-CM

## 2022-12-04 DIAGNOSIS — R197 Diarrhea, unspecified: Secondary | ICD-10-CM

## 2022-12-04 DIAGNOSIS — E876 Hypokalemia: Secondary | ICD-10-CM

## 2022-12-04 LAB — BMP8+EGFR
BUN/Creatinine Ratio: 16 (ref 9–23)
BUN: 13 mg/dL (ref 6–24)
CO2: 25 mmol/L (ref 20–29)
Calcium: 9.4 mg/dL (ref 8.7–10.2)
Chloride: 104 mmol/L (ref 96–106)
Creatinine, Ser: 0.82 mg/dL (ref 0.57–1.00)
Glucose: 102 mg/dL — ABNORMAL HIGH (ref 70–99)
Potassium: 3.2 mmol/L — ABNORMAL LOW (ref 3.5–5.2)
Sodium: 141 mmol/L (ref 134–144)
eGFR: 82 mL/min/{1.73_m2} (ref >59)

## 2022-12-05 ENCOUNTER — Other Ambulatory Visit: Payer: Self-pay | Admitting: Family Medicine

## 2022-12-05 DIAGNOSIS — E876 Hypokalemia: Secondary | ICD-10-CM

## 2022-12-05 LAB — FECAL OCCULT BLOOD, IMMUNOCHEMICAL: Fecal Occult Bld: NEGATIVE

## 2022-12-05 MED ORDER — POTASSIUM CHLORIDE CRYS ER 20 MEQ PO TBCR
20.0000 meq | EXTENDED_RELEASE_TABLET | Freq: Every day | ORAL | 3 refills | Status: DC
Start: 2022-12-05 — End: 2023-03-22

## 2023-01-02 NOTE — Progress Notes (Deleted)
New Patient Note  RE: Cynthia [name] MRN: [id number] DOB: 1965-05-23 Date of Office Visit: 01/03/2023  Consult requested by: Sonny Masters, FNP Primary care provider: Sonny Masters, FNP  Chief Complaint: No chief complaint on file.  History of Present Illness: I had the pleasure of seeing Cynthia [name] for initial evaluation at the [facility] on 01/02/2023. She is a 57 y.o. female, who is referred here by Sonny Masters, FNP for the evaluation of ***.  Discussed the use of AI scribe software for clinical note transcription with the patient, who gave verbal consent to proceed.  History of Present Illness             ***  Assessment and Plan: Cynthia [name] is a 57 y.o. female with: ***  Assessment and Plan               No follow-ups on file.  No orders of the defined types were placed in this encounter.  Lab Orders  No laboratory test(s) ordered today    Other allergy screening: Asthma: {Blank single:19197::"yes","no"} Rhino conjunctivitis: {Blank single:19197::"yes","no"} Food allergy: {Blank single:19197::"yes","no"} Medication allergy: {Blank single:19197::"yes","no"} Hymenoptera allergy: {Blank single:19197::"yes","no"} Urticaria: {Blank single:19197::"yes","no"} Eczema:{Blank single:19197::"yes","no"} History of recurrent infections suggestive of immunodeficency: {Blank single:19197::"yes","no"}  Diagnostics: Spirometry:  Tracings reviewed. Her effort: {Blank single:19197::"Good reproducible efforts.","It was hard to get consistent efforts and there is a question as to whether this reflects a maximal maneuver.","Poor effort, data can not be interpreted."} FVC: ***L FEV1: ***L, ***% predicted FEV1/FVC ratio: ***% Interpretation: {Blank single:19197::"Spirometry consistent with mild obstructive disease","Spirometry consistent with moderate obstructive disease","Spirometry consistent with severe obstructive disease","Spirometry  consistent with possible restrictive disease","Spirometry consistent with mixed obstructive and restrictive disease","Spirometry uninterpretable due to technique","Spirometry consistent with normal pattern","No overt abnormalities noted given today's efforts"}.  Please see scanned spirometry results for details.  Skin Testing: {Blank single:19197::"Select foods","Environmental allergy panel","Environmental allergy panel and select foods","Food allergy panel","None","Deferred due to recent antihistamines use"}. *** Results discussed with patient/family.   Past Medical History: Patient Active Problem List   Diagnosis Date Noted  . Family history of myocardial infarction 09/11/2022  . Allergic conjunctivitis of both eyes 05/09/2021  . Vitamin D deficiency 05/09/2021  . Morbid obesity (HCC) 05/09/2021  . Mixed hyperlipidemia 05/09/2021  . Simple chronic bronchitis (HCC) 01/03/2021  . Bronchitis with bronchospasm 06/09/2018  . Allergic rhinitis due to pollen 01/15/2018  . Acute recurrent frontal sinusitis 01/15/2018  . Reactive airway disease 01/15/2018  . Headache syndrome 04/13/2016  . Chronic maxillary sinusitis 01/23/2016  . History of uterine fibroid 01/23/2016  . S/P hysterectomy 01/23/2016  . Body mass index 36.0-36.9, adult 01/23/2016  . Essential hypertension 12/15/2015  . Slow transit constipation 12/15/2015   Past Medical History:  Diagnosis Date  . Allergy   . High cholesterol   . Hypertension   . Migraine   . Vertigo    Past Surgical History: Past Surgical History:  Procedure Laterality Date  . ABDOMINAL HYSTERECTOMY    . BREAST BIOPSY Left 2018  . BREAST EXCISIONAL BIOPSY Left   . BREAST SURGERY Left    lump removed   . CESAREAN SECTION     x2  . CYST REMOVAL LEG    . FRACTURE SURGERY Right    Arm  . TONSILLECTOMY     Medication List:  Current Outpatient Medications  Medication Sig Dispense Refill  . Albuterol-Budesonide (AIRSUPRA) 90-80 MCG/ACT AERO  Inhale 2 Inhalations into the lungs once as needed for up  to 1 dose (MAX 6 doses (12 inhalations) per 24 hours). 10.7 g 5  . amLODipine (NORVASC) 10 MG tablet TAKE 1 TABLET BY MOUTH EVERY DAY 90 tablet 1  . atorvastatin (LIPITOR) 20 MG tablet TAKE 1 TABLET BY MOUTH EVERY DAY 90 tablet 1  . Budeson-Glycopyrrol-Formoterol (BREZTRI AEROSPHERE) 160-9-4.8 MCG/ACT AERO Inhale 2 puffs into the lungs 2 (two) times daily. 10.7 g 11  . cetirizine (ZYRTEC) 10 MG tablet Take 1 tablet (10 mg total) by mouth daily. 30 tablet 11  . fluticasone (FLONASE) 50 MCG/ACT nasal spray Place 2 sprays into both nostrils daily. 16 g 6  . hydrochlorothiazide (HYDRODIURIL) 25 MG tablet TAKE 1 TABLET (25 MG TOTAL) BY MOUTH DAILY. 90 tablet 0  . levalbuterol (XOPENEX HFA) 45 MCG/ACT inhaler Inhale 2 puffs into the lungs every 6 (six) hours as needed for wheezing or shortness of breath (to REPLACE albuterol). 1 each 12  . montelukast (SINGULAIR) 10 MG tablet Take 1 tablet (10 mg total) by mouth at bedtime. 30 tablet 3  . potassium chloride SA (KLOR-CON M) 20 MEQ tablet Take 1 tablet (20 mEq total) by mouth daily. 30 tablet 3   No current facility-administered medications for this visit.   Allergies: No Known Allergies Social History: Social History   Socioeconomic History  . Marital status: Married    Spouse name: Not on file  . Number of children: Not on file  . Years of education: Not on file  . Highest education level: Not on file  Occupational History  . Not on file  Tobacco Use  . Smoking status: Former    Current packs/day: 0.00    Average packs/day: 0.5 packs/day for 10.0 years (5.0 ttl pk-yrs)    Types: Cigarettes    Start date: 06/04/2006    Quit date: 06/04/2016    Years since quitting: 6.5  . Smokeless tobacco: Never  Vaping Use  . Vaping status: Never Used  Substance and Sexual Activity  . Alcohol use: Yes    Comment: occ  . Drug use: No  . Sexual activity: Not Currently    Partners: Male     Comment: Married  Other Topics Concern  . Not on file  Social History Narrative   Lives at home w/ her husband   Left-handed   Caffeine: 2-3 sodas per day   Social Determinants of Health   Financial Resource Strain: Not on file  Food Insecurity: Not on file  Transportation Needs: Not on file  Physical Activity: Not on file  Stress: Not on file  Social Connections: Not on file   Lives in a ***. Smoking: *** Occupation: ***  Environmental HistorySurveyor, minerals in the house: Copywriter, advertising in the family room: {Blank single:19197::"yes","no"} Carpet in the bedroom: {Blank single:19197::"yes","no"} Heating: {Blank single:19197::"electric","gas","heat pump"} Cooling: {Blank single:19197::"central","window","heat pump"} Pet: {Blank single:19197::"yes ***","no"}  Family History: Family History  Problem Relation Age of Onset  . Heart failure Mother   . Hypertension Father   . Kidney disease Father   . Cancer Maternal Grandmother   . Cancer Maternal Aunt   . Colon cancer Neg Hx   . Esophageal cancer Neg Hx   . Rectal cancer Neg Hx   . Stomach cancer Neg Hx    Problem                               Relation Asthma                                   ***  Eczema                                *** Food allergy                          *** Allergic rhino conjunctivitis     ***  Review of Systems  Constitutional:  Negative for appetite change, chills, fever and unexpected weight change.  HENT:  Negative for congestion and rhinorrhea.   Eyes:  Negative for itching.  Respiratory:  Negative for cough, chest tightness, shortness of breath and wheezing.   Cardiovascular:  Negative for chest pain.  Gastrointestinal:  Negative for abdominal pain.  Genitourinary:  Negative for difficulty urinating.  Skin:  Negative for rash.  Neurological:  Negative for headaches.   Objective: There were no vitals taken for this visit. There is no height or weight on  file to calculate BMI. Physical Exam Vitals and nursing note reviewed.  Constitutional:      Appearance: Normal appearance. She is well-developed.  HENT:     Head: Normocephalic and atraumatic.     Right Ear: Tympanic membrane and external ear normal.     Left Ear: Tympanic membrane and external ear normal.     Nose: Nose normal.     Mouth/Throat:     Mouth: Mucous membranes are moist.     Pharynx: Oropharynx is clear.  Eyes:     Conjunctiva/sclera: Conjunctivae normal.  Cardiovascular:     Rate and Rhythm: Normal rate and regular rhythm.     Heart sounds: Normal heart sounds. No murmur heard.    No friction rub. No gallop.  Pulmonary:     Effort: Pulmonary effort is normal.     Breath sounds: Normal breath sounds. No wheezing, rhonchi or rales.  Musculoskeletal:     Cervical back: Neck supple.  Skin:    General: Skin is warm.     Findings: No rash.  Neurological:     Mental Status: She is alert and oriented to person, place, and time.  Psychiatric:        Behavior: Behavior normal.  The plan was reviewed with the patient/family, and all questions/concerned were addressed.  It was my pleasure to see Nadeline today and participate in her care. Please feel free to contact me with any questions or concerns.  Sincerely,  Wyline Mood, DO Allergy & Immunology  [facility] office: [phone] [facility] office: [phone]

## 2023-01-03 ENCOUNTER — Ambulatory Visit: Payer: BC Managed Care – PPO | Admitting: Allergy

## 2023-01-10 ENCOUNTER — Ambulatory Visit: Payer: BC Managed Care – PPO | Admitting: Internal Medicine

## 2023-01-16 ENCOUNTER — Encounter: Payer: Self-pay | Admitting: Family Medicine

## 2023-01-16 ENCOUNTER — Ambulatory Visit (INDEPENDENT_AMBULATORY_CARE_PROVIDER_SITE_OTHER): Payer: BC Managed Care – PPO

## 2023-01-16 VITALS — BP 128/83 | HR 71 | Temp 97.8°F | Ht 63.0 in | Wt 230.2 lb

## 2023-01-16 DIAGNOSIS — J0101 Acute recurrent maxillary sinusitis: Secondary | ICD-10-CM | POA: Diagnosis not present

## 2023-01-16 DIAGNOSIS — H66001 Acute suppurative otitis media without spontaneous rupture of ear drum, right ear: Secondary | ICD-10-CM

## 2023-01-16 MED ORDER — CEFDINIR 300 MG PO CAPS: 300.0000 mg | ORAL_CAPSULE | Freq: Two times a day (BID) | ORAL | 0 refills | Status: DC

## 2023-01-16 MED ORDER — METHYLPREDNISOLONE ACETATE 40 MG/ML IJ SUSP
40.0000 mg | Freq: Once | INTRAMUSCULAR | Status: AC
Start: 2023-01-16 — End: 2023-01-16
  Administered 2023-01-16: 60 mg via INTRAMUSCULAR

## 2023-01-16 NOTE — Addendum Note (Signed)
Addended by: Lorelee Cover C on: 01/16/2023 09:02 AM   Modules accepted: Orders

## 2023-01-16 NOTE — Progress Notes (Signed)
Subjective:  Patient ID: Cynthia [name], female    DOB: 1965-11-15, 57 y.o.   MRN: [id number]  Patient Care Team: Sonny Masters, FNP as PCP - General (Family Medicine) Serena Colonel, MD as Consulting Physician (Otolaryngology)   Chief Complaint:  Fatigue, facial pressure, Cough, and Nasal Congestion (X 5 days /)   HPI: Cynthia [name] is a 57 y.o. female presenting on 01/16/2023 for Fatigue, facial pressure, Cough, and Nasal Congestion (X 5 days /)   Cough This is a new problem. The current episode started in the past 7 days. The problem has been gradually worsening. The problem occurs constantly. The cough is Productive of sputum. Associated symptoms include chills, ear congestion, ear pain, a fever, headaches, nasal congestion and a sore throat. Pertinent negatives include no chest pain, heartburn, hemoptysis, myalgias, postnasal drip, rash, rhinorrhea, shortness of breath, sweats, weight loss or wheezing. Nothing aggravates the symptoms. She has tried OTC cough suppressant and rest for the symptoms. The treatment provided mild relief.  Sinus Problem This is a new problem. The current episode started in the past 7 days. The problem has been gradually worsening since onset. The pain is mild. Associated symptoms include chills, congestion, coughing, ear pain, headaches, sinus pressure and a sore throat. Pertinent negatives include no diaphoresis, hoarse voice, neck pain, shortness of breath, sneezing or swollen glands. Past treatments include oral decongestants. The treatment provided mild relief.       Relevant past medical, surgical, family, and social history reviewed and updated as indicated.  Allergies and medications reviewed and updated. Data reviewed: Chart in Epic.   Past Medical History:  Diagnosis Date   Allergy    High cholesterol    Hypertension    Migraine    Vertigo     Past Surgical History:  Procedure Laterality Date   ABDOMINAL HYSTERECTOMY      BREAST BIOPSY Left 2018   BREAST EXCISIONAL BIOPSY Left    BREAST SURGERY Left    lump removed    CESAREAN SECTION     x2   CYST REMOVAL LEG     FRACTURE SURGERY Right    Arm   TONSILLECTOMY      Social History   Socioeconomic History   Marital status: Married    Spouse name: Not on file   Number of children: Not on file   Years of education: Not on file   Highest education level: Not on file  Occupational History   Not on file  Tobacco Use   Smoking status: Former    Current packs/day: 0.00    Average packs/day: 0.5 packs/day for 10.0 years (5.0 ttl pk-yrs)    Types: Cigarettes    Start date: 06/04/2006    Quit date: 06/04/2016    Years since quitting: 6.6   Smokeless tobacco: Never  Vaping Use   Vaping status: Never Used  Substance and Sexual Activity   Alcohol use: Yes    Comment: occ   Drug use: No   Sexual activity: Not Currently    Partners: Male    Comment: Married  Other Topics Concern   Not on file  Social History Narrative   Lives at home w/ her husband   Left-handed   Caffeine: 2-3 sodas per day   Social Determinants of Health   Financial Resource Strain: Not on file  Food Insecurity: Not on file  Transportation Needs: Not on file  Physical Activity: Not on file  Stress: Not on file  Social Connections: Not on file  Intimate Partner Violence: Not on file    Outpatient Encounter Medications as of 01/16/2023  Medication Sig   Albuterol-Budesonide (AIRSUPRA) 90-80 MCG/ACT AERO Inhale 2 Inhalations into the lungs once as needed for up to 1 dose (MAX 6 doses (12 inhalations) per 24 hours).   amLODipine (NORVASC) 10 MG tablet TAKE 1 TABLET BY MOUTH EVERY DAY   atorvastatin (LIPITOR) 20 MG tablet TAKE 1 TABLET BY MOUTH EVERY DAY   Budeson-Glycopyrrol-Formoterol (BREZTRI AEROSPHERE) 160-9-4.8 MCG/ACT AERO Inhale 2 puffs into the lungs 2 (two) times daily.   cefdinir (OMNICEF) 300 MG capsule Take 1 capsule (300 mg total) by mouth 2 (two) times daily. 1  po BID   fluticasone (FLONASE) 50 MCG/ACT nasal spray Place 2 sprays into both nostrils daily.   hydrochlorothiazide (HYDRODIURIL) 25 MG tablet TAKE 1 TABLET (25 MG TOTAL) BY MOUTH DAILY.   levalbuterol (XOPENEX HFA) 45 MCG/ACT inhaler Inhale 2 puffs into the lungs every 6 (six) hours as needed for wheezing or shortness of breath (to REPLACE albuterol).   potassium chloride SA (KLOR-CON M) 20 MEQ tablet Take 1 tablet (20 mEq total) by mouth daily.   cetirizine (ZYRTEC) 10 MG tablet Take 1 tablet (10 mg total) by mouth daily. (Patient not taking: Reported on 01/16/2023)   montelukast (SINGULAIR) 10 MG tablet Take 1 tablet (10 mg total) by mouth at bedtime. (Patient not taking: Reported on 01/16/2023)   No facility-administered encounter medications on file as of 01/16/2023.    No Known Allergies  Review of Systems  Constitutional:  Positive for activity change, appetite change, chills and fever. Negative for diaphoresis, fatigue, unexpected weight change and weight loss.  HENT:  Positive for congestion, ear pain, sinus pressure, sinus pain, sore throat and voice change. Negative for dental problem, drooling, ear discharge, facial swelling, hearing loss, hoarse voice, mouth sores, nosebleeds, postnasal drip, rhinorrhea, sneezing, tinnitus and trouble swallowing.   Eyes:  Negative for photophobia and visual disturbance.  Respiratory:  Positive for cough. Negative for apnea, hemoptysis, choking, chest tightness, shortness of breath, wheezing and stridor.   Cardiovascular:  Negative for chest pain, palpitations and leg swelling.  Gastrointestinal:  Negative for abdominal pain and heartburn.  Endocrine: Negative for polydipsia, polyphagia and polyuria.  Genitourinary:  Negative for decreased urine volume and difficulty urinating.  Musculoskeletal:  Negative for myalgias and neck pain.  Skin:  Negative for rash.  Neurological:  Positive for headaches. Negative for dizziness, tremors, seizures, syncope,  facial asymmetry, speech difficulty, weakness, light-headedness and numbness.  Psychiatric/Behavioral:  Negative for confusion.   All other systems reviewed and are negative.       Objective:  BP 128/83   Pulse 71   Temp 97.8 F (36.6 C) (Temporal)   Ht 5\' 3"  (1.6 m)   Wt 230 lb 3.2 oz (104.4 kg)   SpO2 94%   BMI 40.78 kg/m    Wt Readings from Last 3 Encounters:  01/16/23 230 lb 3.2 oz (104.4 kg)  11/23/22 228 lb 9.6 oz (103.7 kg)  09/11/22 225 lb (102.1 kg)    Physical Exam Vitals and nursing note reviewed.  Constitutional:      General: She is not in acute distress.    Appearance: Normal appearance. She is well-developed and well-groomed. She is obese. She is not ill-appearing, toxic-appearing or diaphoretic.  HENT:     Head: Normocephalic and atraumatic.     Jaw: There is normal jaw occlusion.     Right Ear: Hearing  normal. Tympanic membrane is erythematous and bulging.     Left Ear: Hearing, ear canal and external ear normal. A middle ear effusion is present.     Nose: Congestion present.     Right Turbinates: Enlarged.     Left Turbinates: Enlarged.     Right Sinus: Maxillary sinus tenderness present. No frontal sinus tenderness.     Left Sinus: Maxillary sinus tenderness present. No frontal sinus tenderness.     Mouth/Throat:     Lips: Pink.     Mouth: Mucous membranes are moist.     Pharynx: Oropharynx is clear. Uvula midline. Posterior oropharyngeal erythema and postnasal drip present. No oropharyngeal exudate.  Eyes:     General: Lids are normal.     Extraocular Movements: Extraocular movements intact.     Conjunctiva/sclera: Conjunctivae normal.     Pupils: Pupils are equal, round, and reactive to light.  Neck:     Thyroid: No thyroid mass, thyromegaly or thyroid tenderness.     Vascular: No carotid bruit or JVD.     Trachea: Trachea and phonation normal.  Cardiovascular:     Rate and Rhythm: Normal rate and regular rhythm.     Chest Wall: PMI is not  displaced.     Pulses: Normal pulses.     Heart sounds: Normal heart sounds. No murmur heard.    No friction rub. No gallop.  Pulmonary:     Effort: Pulmonary effort is normal. No respiratory distress.     Breath sounds: Normal breath sounds. No wheezing.  Abdominal:     General: Bowel sounds are normal.     Palpations: Abdomen is soft.  Musculoskeletal:        General: Normal range of motion.     Cervical back: Normal range of motion and neck supple.     Right lower leg: No edema.     Left lower leg: No edema.  Lymphadenopathy:     Cervical: No cervical adenopathy.  Skin:    General: Skin is warm and dry.     Capillary Refill: Capillary refill takes less than 2 seconds.     Coloration: Skin is not cyanotic, jaundiced or pale.     Findings: No rash.  Neurological:     General: No focal deficit present.     Mental Status: She is alert and oriented to person, place, and time.     Sensory: Sensation is intact.     Motor: Motor function is intact.     Coordination: Coordination is intact.     Gait: Gait is intact.     Deep Tendon Reflexes: Reflexes are normal and symmetric.  Psychiatric:        Attention and Perception: Attention and perception normal.        Mood and Affect: Mood and affect normal.        Speech: Speech normal.        Behavior: Behavior normal. Behavior is cooperative.        Thought Content: Thought content normal.        Cognition and Memory: Cognition and memory normal.        Judgment: Judgment normal.     Results for orders placed or performed in visit on 12/04/22  Fecal occult blood, imunochemical   Specimen: Stool   ST  Result Value Ref Range   Fecal Occult Bld Negative Negative  BMP8+EGFR  Result Value Ref Range   Glucose 102 (H) 70 - 99 mg/dL   BUN 13  6 - 24 mg/dL   Creatinine, Ser 1.61 0.57 - 1.00 mg/dL   eGFR 82 >09 UE/AVW/0.98   BUN/Creatinine Ratio 16 9 - 23   Sodium 141 134 - 144 mmol/L   Potassium 3.2 (L) 3.5 - 5.2 mmol/L   Chloride  104 96 - 106 mmol/L   CO2 25 20 - 29 mmol/L   Calcium 9.4 8.7 - 10.2 mg/dL       Pertinent labs & imaging results that were available during my care of the patient were reviewed by me and considered in my medical decision making.  Assessment & Plan:  Notasha was seen today for fatigue, facial pressure, cough and nasal congestion.  Diagnoses and all orders for this visit:  Acute recurrent maxillary sinusitis Non-recurrent acute suppurative otitis media of right ear without spontaneous rupture of tympanic membrane Has tried and failed symptomatic care at home. Burst of steroids in office today. Will start below. Aware to take full course of antibiotics. Report new, worsening, or persistent symptoms.  -     cefdinir (OMNICEF) 300 MG capsule; Take 1 capsule (300 mg total) by mouth 2 (two) times daily. 1 po BID     Continue all other maintenance medications.  Follow up plan: Return if symptoms worsen or fail to improve.   Continue healthy lifestyle choices, including diet (rich in fruits, vegetables, and lean proteins, and low in salt and simple carbohydrates) and exercise (at least 30 minutes of moderate physical activity daily).  Educational handout given for OM, sinusitis   The above assessment and management plan was discussed with the patient. The patient verbalized understanding of and has agreed to the management plan. Patient is aware to call the clinic if they develop any new symptoms or if symptoms persist or worsen. Patient is aware when to return to the clinic for a follow-up visit. Patient educated on when it is appropriate to go to the emergency department.   Kari Baars, FNP-C [facility] [phone]

## 2023-02-06 ENCOUNTER — Telehealth: Payer: Self-pay | Admitting: Family Medicine

## 2023-02-06 DIAGNOSIS — Z0279 Encounter for issue of other medical certificate: Secondary | ICD-10-CM

## 2023-02-06 NOTE — Telephone Encounter (Signed)
[employer] faxed FMLA forms to be completed and signed.  Form Fee Paid? (Y/N)      yes      If NO, form is placed on front office manager desk to hold until payment received. If YES, then form will be placed in the RX/HH Nurse Coordinators box for completion.  Form will not be processed until payment is received

## 2023-02-07 NOTE — Telephone Encounter (Signed)
Information completed and forwarded to PCP 

## 2023-02-07 NOTE — Telephone Encounter (Signed)
pcp completed and signed FMLA forms. They have been faxed to [employer] at fax number [phone]. Patient has been contacted and informed they are complete. Copy at front for pt.

## 2023-02-22 ENCOUNTER — Other Ambulatory Visit: Payer: Self-pay | Admitting: Family Medicine

## 2023-02-26 ENCOUNTER — Ambulatory Visit: Payer: BC Managed Care – PPO | Attending: Internal Medicine | Admitting: Internal Medicine

## 2023-03-21 ENCOUNTER — Other Ambulatory Visit: Payer: Self-pay | Admitting: Family Medicine

## 2023-03-21 NOTE — Telephone Encounter (Signed)
Cynthia [name] pt NTBS 30-d given 02/22/23

## 2023-03-21 NOTE — Telephone Encounter (Signed)
Appt scheduled for 03/22/2023

## 2023-03-22 ENCOUNTER — Ambulatory Visit: Payer: BC Managed Care – PPO | Admitting: Family Medicine

## 2023-03-22 ENCOUNTER — Telehealth: Payer: BC Managed Care – PPO | Admitting: Family Medicine

## 2023-03-22 ENCOUNTER — Encounter: Payer: Self-pay | Admitting: Family Medicine

## 2023-03-22 DIAGNOSIS — I10 Essential (primary) hypertension: Secondary | ICD-10-CM | POA: Diagnosis not present

## 2023-03-22 DIAGNOSIS — E7800 Pure hypercholesterolemia, unspecified: Secondary | ICD-10-CM

## 2023-03-22 DIAGNOSIS — J320 Chronic maxillary sinusitis: Secondary | ICD-10-CM

## 2023-03-22 DIAGNOSIS — E876 Hypokalemia: Secondary | ICD-10-CM

## 2023-03-22 MED ORDER — AMLODIPINE BESYLATE 10 MG PO TABS
ORAL_TABLET | ORAL | 1 refills | Status: DC
Start: 2023-03-22 — End: 2023-10-08

## 2023-03-22 MED ORDER — FLUTICASONE PROPIONATE 50 MCG/ACT NA SUSP: 2.0000 | Freq: Every day | NASAL | 6 refills | Status: AC

## 2023-03-22 MED ORDER — POTASSIUM CHLORIDE CRYS ER 20 MEQ PO TBCR: 20.0000 meq | EXTENDED_RELEASE_TABLET | Freq: Every day | ORAL | 3 refills | Status: DC

## 2023-03-22 MED ORDER — HYDROCHLOROTHIAZIDE 25 MG PO TABS: 25.0000 mg | ORAL_TABLET | Freq: Every day | ORAL | 0 refills | Status: DC

## 2023-03-22 MED ORDER — ATORVASTATIN CALCIUM 20 MG PO TABS: ORAL_TABLET | ORAL | 1 refills | Status: DC

## 2023-03-22 NOTE — Progress Notes (Signed)
Virtual Visit via Video   I connected with patient on 03/22/23 at 1145 by a video enabled telemedicine application and verified that I am speaking with the correct person using two identifiers.  Location patient: Home Location provider: [facility] Persons participating in the virtual visit: Patient and Provider  I discussed the limitations of evaluation and management by telemedicine and the availability of in person appointments. The patient expressed understanding and agreed to proceed.  Subjective:   HPI:  Pt presents today for  Chief Complaint  Patient presents with   Medication Refill   Pt needs a refill on her chronic medications. Her husband was recently diagnosed with cancer and she has not been able to get into the office as she is taking him to treatments. She is compliant with all of her medications and denies associated side effects. States her blood pressure is well controlled. No headaches, chest pain, leg swelling, visual changes or confusion. She is on potassium repletion due to hydrochlorothiazide use and hypokalemia. She tolerates this well. She denies myalgias from her statin therapy. She has chronic sinusitis but reports she has not had any significant episodes recently.   Review of Systems  Constitutional:  Negative for chills, diaphoresis, fever, malaise/fatigue and weight loss.  HENT:  Positive for congestion and sinus pain. Negative for ear discharge, ear pain, hearing loss, nosebleeds, sore throat and tinnitus.   Respiratory:  Negative for cough, hemoptysis, sputum production, shortness of breath, wheezing and stridor.   Cardiovascular:  Negative for chest pain, palpitations, orthopnea, claudication, leg swelling and PND.  Neurological: Negative.   All other systems reviewed and are negative.   Patient Active Problem List   Diagnosis Date Noted   Pure hypercholesterolemia 03/22/2023   Hypokalemia 03/22/2023   Family history  of myocardial infarction 09/11/2022   Allergic conjunctivitis of both eyes 05/09/2021   Vitamin D deficiency 05/09/2021   Morbid obesity (HCC) 05/09/2021   Mixed hyperlipidemia 05/09/2021   Simple chronic bronchitis (HCC) 01/03/2021   Bronchitis with bronchospasm 06/09/2018   Allergic rhinitis due to pollen 01/15/2018   Acute recurrent frontal sinusitis 01/15/2018   Reactive airway disease 01/15/2018   Headache syndrome 04/13/2016   Chronic maxillary sinusitis 01/23/2016   History of uterine fibroid 01/23/2016   S/P hysterectomy 01/23/2016   Body mass index 36.0-36.9, adult 01/23/2016   Essential hypertension 12/15/2015   Slow transit constipation 12/15/2015    Social History   Tobacco Use   Smoking status: Former    Current packs/day: 0.00    Average packs/day: 0.5 packs/day for 10.0 years (5.0 ttl pk-yrs)    Types: Cigarettes    Start date: 06/04/2006    Quit date: 06/04/2016    Years since quitting: 6.8   Smokeless tobacco: Never  Substance Use Topics   Alcohol use: Yes    Comment: occ    Current Outpatient Medications:    Albuterol-Budesonide (AIRSUPRA) 90-80 MCG/ACT AERO, Inhale 2 Inhalations into the lungs once as needed for up to 1 dose (MAX 6 doses (12 inhalations) per 24 hours)., Disp: 10.7 g, Rfl: 5   amLODipine (NORVASC) 10 MG tablet, TAKE 1 TABLET BY MOUTH EVERY DAY, Disp: 90 tablet, Rfl: 1   atorvastatin (LIPITOR) 20 MG tablet, TAKE 1 TABLET BY MOUTH EVERY DAY, Disp: 90 tablet, Rfl: 1   Budeson-Glycopyrrol-Formoterol (BREZTRI AEROSPHERE) 160-9-4.8 MCG/ACT AERO, Inhale 2 puffs into the lungs 2 (two) times daily., Disp: 10.7 g, Rfl: 11   cetirizine (ZYRTEC) 10 MG tablet, Take  1 tablet (10 mg total) by mouth daily. (Patient not taking: Reported on 01/16/2023), Disp: 30 tablet, Rfl: 11   fluticasone (FLONASE) 50 MCG/ACT nasal spray, Place 2 sprays into both nostrils daily., Disp: 16 g, Rfl: 6   hydrochlorothiazide (HYDRODIURIL) 25 MG tablet, Take 1 tablet (25 mg total)  by mouth daily., Disp: 90 tablet, Rfl: 0   levalbuterol (XOPENEX HFA) 45 MCG/ACT inhaler, Inhale 2 puffs into the lungs every 6 (six) hours as needed for wheezing or shortness of breath (to REPLACE albuterol)., Disp: 1 each, Rfl: 12   montelukast (SINGULAIR) 10 MG tablet, Take 1 tablet (10 mg total) by mouth at bedtime. (Patient not taking: Reported on 01/16/2023), Disp: 30 tablet, Rfl: 3   potassium chloride SA (KLOR-CON M) 20 MEQ tablet, Take 1 tablet (20 mEq total) by mouth daily., Disp: 30 tablet, Rfl: 3  No Known Allergies  Objective:   There were no vitals taken for this visit.  Patient is well-developed, well-nourished in no acute distress.  Resting comfortably at home.  Head is normocephalic, atraumatic.  No labored breathing.  Speech is clear and coherent with logical content.  Patient is alert and oriented at baseline.    Assessment and Plan:   Shakinah was seen today for medication refill.  Diagnoses and all orders for this visit:  Essential hypertension -     amLODipine (NORVASC) 10 MG tablet; TAKE 1 TABLET BY MOUTH EVERY DAY -     hydrochlorothiazide (HYDRODIURIL) 25 MG tablet; Take 1 tablet (25 mg total) by mouth daily.  Pure hypercholesterolemia -     atorvastatin (LIPITOR) 20 MG tablet; TAKE 1 TABLET BY MOUTH EVERY DAY  Hypokalemia -     potassium chloride SA (KLOR-CON M) 20 MEQ tablet; Take 1 tablet (20 mEq total) by mouth daily.  Chronic maxillary sinusitis -     fluticasone (FLONASE) 50 MCG/ACT nasal spray; Place 2 sprays into both nostrils daily.    Continue all medications. Follow up in office in 3 months for medical management of chronic conditions and all labs.   Return in about 3 months (around 06/20/2023), or if symptoms worsen or fail to improve, for all labs.  Kari Baars, FNP-C [facility] [address] [place], [place] [address] [phone]  03/22/2023  Time spent with the patient: 15 minutes, of which >50% was  spent in obtaining information about symptoms, reviewing previous labs, evaluations, and treatments, counseling about condition (please see the discussed topics above), and developing a plan to further investigate it; had a number of questions which I addressed.

## 2023-06-25 ENCOUNTER — Other Ambulatory Visit: Payer: Self-pay | Admitting: Family Medicine

## 2023-06-25 DIAGNOSIS — I10 Essential (primary) hypertension: Secondary | ICD-10-CM

## 2023-06-26 ENCOUNTER — Other Ambulatory Visit: Payer: Self-pay | Admitting: Family Medicine

## 2023-06-26 DIAGNOSIS — E876 Hypokalemia: Secondary | ICD-10-CM

## 2023-07-25 ENCOUNTER — Ambulatory Visit (INDEPENDENT_AMBULATORY_CARE_PROVIDER_SITE_OTHER): Admitting: Family Medicine

## 2023-07-25 ENCOUNTER — Encounter: Payer: Self-pay | Admitting: Family Medicine

## 2023-07-25 ENCOUNTER — Ambulatory Visit: Admitting: Family Medicine

## 2023-07-25 VITALS — BP 141/87 | HR 82 | Temp 97.5°F | Ht 63.0 in | Wt 230.2 lb

## 2023-07-25 DIAGNOSIS — E559 Vitamin D deficiency, unspecified: Secondary | ICD-10-CM | POA: Diagnosis not present

## 2023-07-25 DIAGNOSIS — E782 Mixed hyperlipidemia: Secondary | ICD-10-CM

## 2023-07-25 DIAGNOSIS — R632 Polyphagia: Secondary | ICD-10-CM

## 2023-07-25 DIAGNOSIS — R5383 Other fatigue: Secondary | ICD-10-CM

## 2023-07-25 DIAGNOSIS — F4321 Adjustment disorder with depressed mood: Secondary | ICD-10-CM

## 2023-07-25 DIAGNOSIS — R5381 Other malaise: Secondary | ICD-10-CM | POA: Diagnosis not present

## 2023-07-25 DIAGNOSIS — J410 Simple chronic bronchitis: Secondary | ICD-10-CM

## 2023-07-25 MED ORDER — FLUOXETINE HCL 10 MG PO CAPS: 10.0000 mg | ORAL_CAPSULE | Freq: Every day | ORAL | 3 refills | Status: DC

## 2023-07-25 MED ORDER — AIRSUPRA 90-80 MCG/ACT IN AERO: 2.0000 | INHALATION_SPRAY | Freq: Once | RESPIRATORY_TRACT | 5 refills | Status: AC | PRN

## 2023-07-25 NOTE — Progress Notes (Signed)
 Subjective:  Patient ID: Cynthia [name], female    DOB: Nov 04, 1965, 58 y.o.   MRN: [id number]  Patient Care Team: Sonny Masters, FNP as PCP - General (Family Medicine) Serena Colonel, MD as Consulting Physician (Otolaryngology)   Chief Complaint:  Depression (X 5 months ago )   HPI: Cynthia [name] is a 58 y.o. female presenting on 07/25/2023 for Depression (X 5 months ago )   Discussed the use of AI scribe software for clinical note transcription with the patient, who gave verbal consent to proceed.  History of Present Illness   She is a 58 year old female who presents with depression related to her husband's cancer diagnosis.  Over the past few months, she has been experiencing depression, which she attributes to her husband's cancer diagnosis. She wants to be strong for him but finds the situation overwhelming. She uses binge eating as a coping mechanism and is concerned about weight gain. She has been frequently absent from work to care for her husband, adding to her stress.  She is currently taking hydrochlorothiazide and amlodipine for hypertension, atorvastatin for hyperlipidemia, and occasionally Claritin for sinus issues. She discontinued montelukast due to heart palpitations. She is not taking her vitamin D supplements and manages a history of low potassium with supplements.  She reports hair loss, which she attributes to stress, and suspects menopause due to symptoms like night sweats and emotional changes. She is concerned about her weight and is interested in weight loss options, noting that many colleagues have successfully lost weight recently.  Her husband is undergoing cancer treatment, including chemotherapy and radiation, and is on a feeding tube. She describes caregiving as challenging and emotionally taxing, significantly impacting her life.         07/25/2023    3:58 PM 09/11/2022    8:03 AM 09/11/2021    9:01 AM 09/05/2021   11:24 AM 08/28/2021    9:06 AM   Depression screen PHQ 2/9  Decreased Interest 0 0 0 0 0  Down, Depressed, Hopeless 1 0 0 0 0  PHQ - 2 Score 1 0 0 0 0  Altered sleeping 3 3 0 0   Tired, decreased energy 3 3 0 0   Change in appetite 3 1 0 0   Feeling bad or failure about yourself  3 0 0 0   Trouble concentrating 2 0 0 0   Moving slowly or fidgety/restless 0 0 0 0   Suicidal thoughts 0 0 0 0   PHQ-9 Score 15 7 0 0   Difficult doing work/chores Not difficult at all Not difficult at all Not difficult at all Not difficult at all       07/25/2023    3:59 PM 09/11/2022    8:03 AM 09/11/2021    9:01 AM 09/05/2021   11:24 AM  GAD 7 : Generalized Anxiety Score  Nervous, Anxious, on Edge 1 3 0 0  Control/stop worrying 1 3 0 0  Worry too much - different things 1 3 0 0  Trouble relaxing 1 3 0 0  Restless 1 0 0 0  Easily annoyed or irritable 1 0 0 0  Afraid - awful might happen 1 0 0 0  Total GAD 7 Score 7 12 0 0  Anxiety Difficulty Not difficult at all Not difficult at all Not difficult at all Not difficult at all        Relevant past medical, surgical, family, and social history reviewed and updated  as indicated.  Allergies and medications reviewed and updated. Data reviewed: Chart in Epic.   Past Medical History:  Diagnosis Date   Allergy    High cholesterol    Hypertension    Migraine    Vertigo     Past Surgical History:  Procedure Laterality Date   ABDOMINAL HYSTERECTOMY     BREAST BIOPSY Left 2018   BREAST EXCISIONAL BIOPSY Left    BREAST SURGERY Left    lump removed    CESAREAN SECTION     x2   CYST REMOVAL LEG     FRACTURE SURGERY Right    Arm   TONSILLECTOMY      Social History   Socioeconomic History   Marital status: Married    Spouse name: Not on file   Number of children: Not on file   Years of education: Not on file   Highest education level: Not on file  Occupational History   Not on file  Tobacco Use   Smoking status: Former    Current packs/day: 0.00    Average packs/day:  0.5 packs/day for 10.0 years (5.0 ttl pk-yrs)    Types: Cigarettes    Start date: 06/04/2006    Quit date: 06/04/2016    Years since quitting: 7.1   Smokeless tobacco: Never  Vaping Use   Vaping status: Never Used  Substance and Sexual Activity   Alcohol use: Yes    Comment: occ   Drug use: No   Sexual activity: Not Currently    Partners: Male    Comment: Married  Other Topics Concern   Not on file  Social History Narrative   Lives at home w/ her husband   Left-handed   Caffeine: 2-3 sodas per day   Social Drivers of Corporate investment banker Strain: Not on file  Food Insecurity: Not on file  Transportation Needs: Not on file  Physical Activity: Not on file  Stress: Not on file  Social Connections: Not on file  Intimate Partner Violence: Not on file    Outpatient Encounter Medications as of 07/25/2023  Medication Sig   amLODipine (NORVASC) 10 MG tablet TAKE 1 TABLET BY MOUTH EVERY DAY   atorvastatin (LIPITOR) 20 MG tablet TAKE 1 TABLET BY MOUTH EVERY DAY   Budeson-Glycopyrrol-Formoterol (BREZTRI AEROSPHERE) 160-9-4.8 MCG/ACT AERO Inhale 2 puffs into the lungs 2 (two) times daily.   FLUoxetine (PROZAC) 10 MG capsule Take 1 capsule (10 mg total) by mouth daily.   fluticasone (FLONASE) 50 MCG/ACT nasal spray Place 2 sprays into both nostrils daily.   hydrochlorothiazide (HYDRODIURIL) 25 MG tablet TAKE 1 TABLET (25 MG TOTAL) BY MOUTH DAILY.   levalbuterol (XOPENEX HFA) 45 MCG/ACT inhaler Inhale 2 puffs into the lungs every 6 (six) hours as needed for wheezing or shortness of breath (to REPLACE albuterol).   montelukast (SINGULAIR) 10 MG tablet Take 1 tablet (10 mg total) by mouth at bedtime.   potassium chloride SA (KLOR-CON M) 20 MEQ tablet TAKE 1 TABLET BY MOUTH EVERY DAY   [DISCONTINUED] Albuterol-Budesonide (AIRSUPRA) 90-80 MCG/ACT AERO Inhale 2 Inhalations into the lungs once as needed for up to 1 dose (MAX 6 doses (12 inhalations) per 24 hours).   Albuterol-Budesonide  (AIRSUPRA) 90-80 MCG/ACT AERO Inhale 2 Inhalations into the lungs once as needed for up to 1 dose (MAX 6 doses (12 inhalations) per 24 hours).   cetirizine (ZYRTEC) 10 MG tablet Take 1 tablet (10 mg total) by mouth daily. (Patient not taking: Reported on 07/25/2023)  No facility-administered encounter medications on file as of 07/25/2023.    No Known Allergies  Pertinent ROS per HPI, otherwise unremarkable      Objective:  BP (!) 141/87   Pulse 82   Temp (!) 97.5 F (36.4 C)   Ht 5\' 3"  (1.6 m)   Wt 230 lb 3.2 oz (104.4 kg)   SpO2 93%   BMI 40.78 kg/m    Wt Readings from Last 3 Encounters:  07/25/23 230 lb 3.2 oz (104.4 kg)  01/16/23 230 lb 3.2 oz (104.4 kg)  11/23/22 228 lb 9.6 oz (103.7 kg)    Physical Exam Vitals and nursing note reviewed.  Constitutional:      General: She is not in acute distress.    Appearance: Normal appearance. She is well-developed and well-groomed. She is morbidly obese. She is not ill-appearing, toxic-appearing or diaphoretic.  HENT:     Head: Normocephalic and atraumatic.     Jaw: There is normal jaw occlusion.     Right Ear: Hearing normal.     Left Ear: Hearing normal.     Nose: Nose normal.     Mouth/Throat:     Lips: Pink.     Mouth: Mucous membranes are moist.     Pharynx: Oropharynx is clear. Uvula midline.  Eyes:     General: Lids are normal.     Extraocular Movements: Extraocular movements intact.     Conjunctiva/sclera: Conjunctivae normal.     Pupils: Pupils are equal, round, and reactive to light.  Neck:     Thyroid: No thyroid mass, thyromegaly or thyroid tenderness.     Vascular: No carotid bruit or JVD.     Trachea: Trachea and phonation normal.  Cardiovascular:     Rate and Rhythm: Normal rate and regular rhythm.     Chest Wall: PMI is not displaced.     Pulses: Normal pulses.     Heart sounds: Normal heart sounds. No murmur heard.    No friction rub. No gallop.  Pulmonary:     Effort: Pulmonary effort is normal. No  respiratory distress.     Breath sounds: Normal breath sounds. No wheezing.  Abdominal:     General: Bowel sounds are normal. There is no distension or abdominal bruit.     Palpations: Abdomen is soft. There is no hepatomegaly or splenomegaly.     Tenderness: There is no abdominal tenderness. There is no right CVA tenderness or left CVA tenderness.     Hernia: No hernia is present.  Musculoskeletal:        General: Normal range of motion.     Cervical back: Normal range of motion and neck supple.     Right lower leg: No edema.     Left lower leg: No edema.  Lymphadenopathy:     Cervical: No cervical adenopathy.  Skin:    General: Skin is warm and dry.     Capillary Refill: Capillary refill takes less than 2 seconds.     Coloration: Skin is not cyanotic, jaundiced or pale.     Findings: No rash.  Neurological:     General: No focal deficit present.     Mental Status: She is alert and oriented to person, place, and time.     Sensory: Sensation is intact.     Motor: Motor function is intact.     Coordination: Coordination is intact.     Gait: Gait is intact.     Deep Tendon Reflexes: Reflexes are normal and  symmetric.  Psychiatric:        Attention and Perception: Attention and perception normal.        Mood and Affect: Mood normal. Affect is tearful.        Speech: Speech normal.        Behavior: Behavior normal. Behavior is cooperative.        Thought Content: Thought content normal. Thought content does not include homicidal or suicidal ideation. Thought content does not include homicidal or suicidal plan.        Cognition and Memory: Cognition and memory normal.        Judgment: Judgment normal.      Results for orders placed or performed in visit on 12/04/22  BMP8+EGFR   Collection Time: 12/04/22  8:47 AM  Result Value Ref Range   Glucose 102 (H) 70 - 99 mg/dL   BUN 13 6 - 24 mg/dL   Creatinine, Ser 1.61 0.57 - 1.00 mg/dL   eGFR 82 >09 UE/AVW/0.98   BUN/Creatinine  Ratio 16 9 - 23   Sodium 141 134 - 144 mmol/L   Potassium 3.2 (L) 3.5 - 5.2 mmol/L   Chloride 104 96 - 106 mmol/L   CO2 25 20 - 29 mmol/L   Calcium 9.4 8.7 - 10.2 mg/dL  Fecal occult blood, imunochemical   Collection Time: 12/04/22  1:23 PM   Specimen: Stool   ST  Result Value Ref Range   Fecal Occult Bld Negative Negative       Pertinent labs & imaging results that were available during my care of the patient were reviewed by me and considered in my medical decision making.  Assessment & Plan:  Albie was seen today for depression.  Diagnoses and all orders for this visit:  Situational depression -     CMP14+EGFR -     CBC with Differential/Platelet -     Thyroid Panel With TSH -     VITAMIN D 25 Hydroxy (Vit-D Deficiency, Fractures) -     FLUoxetine (PROZAC) 10 MG capsule; Take 1 capsule (10 mg total) by mouth daily.  Malaise and fatigue -     CMP14+EGFR -     CBC with Differential/Platelet -     Thyroid Panel With TSH -     VITAMIN D 25 Hydroxy (Vit-D Deficiency, Fractures)  Binge eating -     CMP14+EGFR -     CBC with Differential/Platelet -     Thyroid Panel With TSH -     VITAMIN D 25 Hydroxy (Vit-D Deficiency, Fractures)  Vitamin D deficiency -     CMP14+EGFR -     VITAMIN D 25 Hydroxy (Vit-D Deficiency, Fractures)  Morbid obesity (HCC) -     CMP14+EGFR -     CBC with Differential/Platelet -     Lipid panel -     Thyroid Panel With TSH -     VITAMIN D 25 Hydroxy (Vit-D Deficiency, Fractures)  Mixed hyperlipidemia -     CMP14+EGFR -     Lipid panel  Simple chronic bronchitis (HCC) -     Albuterol-Budesonide (AIRSUPRA) 90-80 MCG/ACT AERO; Inhale 2 Inhalations into the lungs once as needed for up to 1 dose (MAX 6 doses (12 inhalations) per 24 hours).     Assessment and Plan    Situational Depression Experiencing situational depression related to her husband's cancer diagnosis and treatment. Reports feeling overwhelmed and binge eating as a coping  mechanism. Depression ongoing for several months. Discussed starting fluoxetine,  an SSRI, to help regulate serotonin and norepinephrine levels. Explained that side effects like headache or nausea are uncommon and typically resolve within two weeks. Emphasized the importance of self-care to manage her mental health while caring for her husband. - Prescribe fluoxetine 10 mg daily at bedtime. - Order lab work to check thyroid function, vitamin D levels, and other relevant parameters.  Hypertension Currently taking hydrochlorothiazide and amlodipine for hypertension. No issues with current blood pressure management, although emotional stress may temporarily affect blood pressure. - Continue hydrochlorothiazide and amlodipine.  Hyperlipidemia On atorvastatin for cholesterol management, taking it as prescribed at night. No reported issues with this medication. - Continue atorvastatin as prescribed.  Allergic Rhinitis Managing allergic rhinitis with a humidifier and Claritin. Advised to rotate antihistamines every three to six months. Not taking montelukast due to previous side effects of palpitations. - Continue Claritin as needed. - Rotate antihistamines every three to six months as needed. - Copywriter, advertising inhaler for bronchitis or asthma flare-ups.  Weight Management Interested in weight loss and inquired about a weight loss medication. Discussed the importance of lifestyle modifications, including diet and exercise, and the need for insurance coverage for the medication. Advised to demonstrate weight maintenance or loss before considering medication. Explained that weight loss medications require increased water intake, limited fatty foods, and regular exercise. Contraindications include medullary thyroid cancer and pancreatitis. - Provide contact number to check insurance coverage for weight loss medication. - Advise to focus on diet, exercise, and increased water intake. - Reassess weight  management progress in six weeks.  General Health Maintenance Not currently taking vitamin D supplements and managing low potassium. Emphasized the importance of checking lab work to ensure overall health maintenance. - Order lab work to check vitamin D levels and other relevant parameters.  Follow-up Scheduled for a follow-up visit to reassess depression, weight management, and overall health status. - Schedule follow-up appointment in six weeks to reassess depression, weight management, and review lab results.          Continue all other maintenance medications.  Follow up plan: Return in 6 weeks (on 09/05/2023), or if symptoms worsen or fail to improve, for Depression, Anxiety, BMI.   Continue healthy lifestyle choices, including diet (rich in fruits, vegetables, and lean proteins, and low in salt and simple carbohydrates) and exercise (at least 30 minutes of moderate physical activity daily).  Educational handout given for depression   The above assessment and management plan was discussed with the patient. The patient verbalized understanding of and has agreed to the management plan. Patient is aware to call the clinic if they develop any new symptoms or if symptoms persist or worsen. Patient is aware when to return to the clinic for a follow-up visit. Patient educated on when it is appropriate to go to the emergency department.   Kattie Parrot, FNP-C [facility] [phone]

## 2023-07-25 NOTE — Patient Instructions (Addendum)
 Here is a guide to help us  find out which weight loss medications will be covered by your insurance plan.  Please check out this web site  [employer] and follow the instructions.   There is also a phone number you can call if you do not have access to the Internet. Call [phone] (Monday- Friday 8am-8pm) and an associate will help navigate you.   [employer] provides coverage information for more than 80% of the inquiries submitted!!    If your symptoms worsen or you have thoughts of suicide/homicide, PLEASE SEEK IMMEDIATE MEDICAL ATTENTION.  You may always call the National Suicide Hotline.  This is available 24 hours a day, 7 days a week.  Their number is: 843-695-5547  Taking the medicine as directed and not missing any doses is one of the best things you can do to treat your depression.  Here are some things to keep in mind:  Side effects (stomach upset, some increased anxiety) may happen before you notice a benefit.  These side effects typically go away over time. Changes to your dose of medicine or a change in medication all together is sometimes necessary Most people need to be on medication at least 12 months Many people will notice an improvement within two weeks but the full effect of the medication can take up to 4-6 weeks Stopping the medication when you start feeling better often results in a return of symptoms Never discontinue your medication without contacting a health care professional first.  Some medications require gradual discontinuation/ taper and can make you sick if you stop them abruptly.  If your symptoms worsen or you have thoughts of suicide/homicide, PLEASE SEEK IMMEDIATE MEDICAL ATTENTION.  You may always call:  National Suicide Hotline: (684)149-8508 [place] Crisis Line: (475)804-1589 Crisis Recovery in [place]: ([phone] Cell Phone 988  These are available 24 hours a day, 7 days a week.

## 2023-07-26 LAB — THYROID PANEL WITH TSH
Free Thyroxine Index: 2.2 (ref 1.2–4.9)
T3 Uptake Ratio: 27 % (ref 24–39)
T4, Total: 8 ug/dL (ref 4.5–12.0)
TSH: 0.928 u[IU]/mL (ref 0.450–4.500)

## 2023-07-26 LAB — LIPID PANEL
Chol/HDL Ratio: 4.8 ratio — ABNORMAL HIGH (ref 0.0–4.4)
Cholesterol, Total: 152 mg/dL (ref 100–199)
HDL: 32 mg/dL — ABNORMAL LOW (ref >39)
LDL Chol Calc (NIH): 87 mg/dL (ref 0–99)
Triglycerides: 189 mg/dL — ABNORMAL HIGH (ref 0–149)
VLDL Cholesterol Cal: 33 mg/dL (ref 5–40)

## 2023-07-26 LAB — CBC WITH DIFFERENTIAL/PLATELET
Basophils Absolute: 0 10*3/uL (ref 0.0–0.2)
Basos: 1 %
EOS (ABSOLUTE): 0.2 10*3/uL (ref 0.0–0.4)
Eos: 2 %
Hematocrit: 37 % (ref 34.0–46.6)
Hemoglobin: 11.9 g/dL (ref 11.1–15.9)
Immature Grans (Abs): 0 10*3/uL (ref 0.0–0.1)
Immature Granulocytes: 0 %
Lymphocytes Absolute: 3.3 10*3/uL — ABNORMAL HIGH (ref 0.7–3.1)
Lymphs: 45 %
MCH: 25.8 pg — ABNORMAL LOW (ref 26.6–33.0)
MCHC: 32.2 g/dL (ref 31.5–35.7)
MCV: 80 fL (ref 79–97)
Monocytes Absolute: 0.5 10*3/uL (ref 0.1–0.9)
Monocytes: 7 %
Neutrophils Absolute: 3.3 10*3/uL (ref 1.4–7.0)
Neutrophils: 45 %
Platelets: 390 10*3/uL (ref 150–450)
RBC: 4.61 x10E6/uL (ref 3.77–5.28)
RDW: 14.5 % (ref 11.7–15.4)
WBC: 7.3 10*3/uL (ref 3.4–10.8)

## 2023-07-26 LAB — CMP14+EGFR
ALT: 16 IU/L (ref 0–32)
AST: 19 IU/L (ref 0–40)
Albumin: 4.3 g/dL (ref 3.8–4.9)
Alkaline Phosphatase: 122 IU/L — ABNORMAL HIGH (ref 44–121)
BUN/Creatinine Ratio: 16 (ref 9–23)
BUN: 13 mg/dL (ref 6–24)
Bilirubin Total: 0.4 mg/dL (ref 0.0–1.2)
CO2: 21 mmol/L (ref 20–29)
Calcium: 9.7 mg/dL (ref 8.7–10.2)
Chloride: 102 mmol/L (ref 96–106)
Creatinine, Ser: 0.83 mg/dL (ref 0.57–1.00)
Globulin, Total: 2.8 g/dL (ref 1.5–4.5)
Glucose: 88 mg/dL (ref 70–99)
Potassium: 3.6 mmol/L (ref 3.5–5.2)
Sodium: 141 mmol/L (ref 134–144)
Total Protein: 7.1 g/dL (ref 6.0–8.5)
eGFR: 81 mL/min/{1.73_m2} (ref >59)

## 2023-07-26 LAB — VITAMIN D 25 HYDROXY (VIT D DEFICIENCY, FRACTURES): Vit D, 25-Hydroxy: 18 ng/mL — ABNORMAL LOW (ref 30.0–100.0)

## 2023-07-29 ENCOUNTER — Ambulatory Visit: Payer: Self-pay

## 2023-07-29 NOTE — Telephone Encounter (Signed)
 Nurse calling today. LS

## 2023-07-29 NOTE — Telephone Encounter (Signed)
 Pt inquired about  her lab results. RN communicated to pt provider's messages about her labs. In regards to alk phos, pt would like more clarification about what isoenzymes are and when this lab needs to be repeated. Please follow-up with the pt.  In terms of triglycerides and vitamin D , pt verbalized understanding.  Copied from CRM [id number]. Topic: Clinical - Lab/Test Results >> Jul 29, 2023  9:13 AM Baldemar Lev wrote: Reason for CRM: Pt has questions about her lab results Reason for Disposition  Health Information question, no triage required and triager able to answer question  Answer Assessment - Initial Assessment Questions 1. REASON FOR CALL or QUESTION: "What is your reason for calling today?" or "How can I best help you?" or "What question do you have that I can help answer?"     Pt had questions about lab results  Protocols used: Information Only Call - No Triage-A-AH

## 2023-08-06 ENCOUNTER — Ambulatory Visit (INDEPENDENT_AMBULATORY_CARE_PROVIDER_SITE_OTHER): Admitting: Family Medicine

## 2023-08-06 ENCOUNTER — Encounter: Payer: Self-pay | Admitting: Family Medicine

## 2023-08-06 VITALS — BP 139/85 | HR 103 | Temp 98.0°F | Ht 63.0 in | Wt 226.8 lb

## 2023-08-06 DIAGNOSIS — Z0279 Encounter for issue of other medical certificate: Secondary | ICD-10-CM

## 2023-08-06 DIAGNOSIS — F4323 Adjustment disorder with mixed anxiety and depressed mood: Secondary | ICD-10-CM | POA: Insufficient documentation

## 2023-08-06 MED ORDER — HYDROXYZINE PAMOATE 25 MG PO CAPS: 25.0000 mg | ORAL_CAPSULE | Freq: Three times a day (TID) | ORAL | 0 refills | Status: DC | PRN

## 2023-08-06 NOTE — Progress Notes (Signed)
 Subjective:  Patient ID: Cynthia [name], female    DOB: 08/12/1965, 58 y.o.   MRN: [id number]  Patient Care Team: Galvin Jules, FNP as PCP - General (Family Medicine) Janita Mellow, MD as Consulting Physician (Otolaryngology)   Chief Complaint:  Anxiety (Due to her husband having 2 month left to live due to his cancer )   HPI: Cynthia [name] is a 58 y.o. female presenting on 08/06/2023 for Anxiety (Due to her husband having 2 month left to live due to his cancer )    History of Present Illness   Cynthia [name] is a 58 year old female with anxiety and elevated blood pressure who presents with stress related to her husband's health.  She is experiencing significant stress and anxiety due to her husband's recent health decline. Her husband, who had previously been in remission from prostate cancer, has experienced a recurrence of cancer with a poor prognosis. This situation has been emotionally taxing, contributing to her anxiety and elevated blood pressure.  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  She has a history of anxiety and elevated blood pressure. She has not yet started her prescribed medications.         08/06/2023   10:49 AM 07/25/2023    3:58 PM 09/11/2022    8:03 AM 09/11/2021    9:01 AM 09/05/2021   11:24 AM  Depression screen PHQ 2/9  Decreased Interest 3 0 0 0 0  Down, Depressed, Hopeless 3 1 0 0 0  PHQ - 2 Score 6 1 0 0 0  Altered sleeping 3 3 3  0 0  Tired, decreased energy 1 3 3  0 0  Change in appetite 0 3 1 0 0  Feeling bad or failure about yourself  3 3 0 0 0  Trouble concentrating 1 2 0 0 0  Moving slowly or fidgety/restless 0 0 0 0 0  Suicidal thoughts 0 0 0 0 0  PHQ-9 Score 14 15 7  0 0   Difficult doing work/chores Very difficult Not difficult at all Not difficult at all Not difficult at all Not difficult at all      08/06/2023   10:49 AM 07/25/2023    3:59 PM 09/11/2022    8:03 AM 09/11/2021    9:01 AM  GAD 7 : Generalized Anxiety Score  Nervous, Anxious, on Edge 3 1 3  0  Control/stop worrying 3 1 3  0  Worry too much - different things 3 1 3  0  Trouble relaxing 3 1 3  0  Restless 3 1 0 0  Easily annoyed or irritable 3 1 0 0  Afraid - awful might happen 3 1 0 0  Total GAD 7 Score 21 7 12  0  Anxiety Difficulty Extremely difficult Not difficult at all Not difficult at all Not difficult at all        Relevant past medical, surgical, family, and social history reviewed and updated as indicated.  Allergies and medications reviewed and updated. Data reviewed: Chart in Epic.   Past Medical History:  Diagnosis Date   Allergy    High cholesterol    Hypertension    Migraine    Vertigo     Past Surgical History:  Procedure Laterality Date   ABDOMINAL HYSTERECTOMY     BREAST BIOPSY Left 2018   BREAST EXCISIONAL BIOPSY Left    BREAST SURGERY Left    lump removed    CESAREAN SECTION     x2   CYST REMOVAL LEG     FRACTURE SURGERY Right    Arm   TONSILLECTOMY      Social History   Socioeconomic History   Marital status: Married    Spouse name: Not on file   Number of children: Not on file   Years of education: Not on file   Highest education level: Not on file  Occupational History   Not on file  Tobacco Use   Smoking status: Former    Current packs/day: 0.00    Average packs/day: 0.5 packs/day for 10.0 years (5.0 ttl pk-yrs)    Types: Cigarettes    Start date: 06/04/2006    Quit date: 06/04/2016    Years since quitting: 7.1   Smokeless tobacco: Never  Vaping Use   Vaping status: Never Used  Substance and Sexual Activity   Alcohol use: Yes    Comment: occ   Drug use: No   Sexual activity: Not Currently    Partners: Male    Comment: Married   Other Topics Concern   Not on file  Social History Narrative   Lives at home w/ her husband   Left-handed   Caffeine: 2-3 sodas per day   Social Drivers of Health   Financial Resource Strain: Not on file  Food Insecurity: Not on file  Transportation Needs: Not on file  Physical Activity: Not on file  Stress: Not on file  Social Connections: Not on file  Intimate Partner Violence: Not on file    Outpatient Encounter Medications as of 08/06/2023  Medication Sig   Albuterol -Budesonide  (AIRSUPRA ) 90-80 MCG/ACT AERO Inhale 2 Inhalations into the lungs once as needed for up to 1 dose (MAX 6 doses (12 inhalations) per 24 hours).   amLODipine  (NORVASC ) 10 MG tablet TAKE 1 TABLET BY MOUTH EVERY DAY   atorvastatin  (LIPITOR) 20 MG tablet TAKE 1 TABLET BY MOUTH EVERY DAY   Budeson-Glycopyrrol-Formoterol  (BREZTRI  AEROSPHERE) 160-9-4.8 MCG/ACT AERO Inhale 2 puffs into the lungs 2 (two) times daily.   cetirizine  (ZYRTEC ) 10 MG tablet Take 1 tablet (10 mg total) by mouth daily.   fluticasone  (FLONASE ) 50 MCG/ACT nasal spray Place 2 sprays into both nostrils daily.   hydrochlorothiazide  (HYDRODIURIL ) 25 MG tablet TAKE 1 TABLET (25 MG TOTAL) BY MOUTH DAILY.   hydrOXYzine (VISTARIL) 25 MG capsule Take 1 capsule (25 mg total) by mouth every 8 (eight) hours as needed for anxiety.   levalbuterol  (XOPENEX  HFA) 45 MCG/ACT inhaler Inhale 2 puffs into the lungs every 6 (six) hours as needed for wheezing or shortness of breath (to REPLACE albuterol ).   montelukast  (SINGULAIR ) 10 MG tablet Take 1 tablet (10 mg total) by mouth at bedtime.   potassium chloride  SA (KLOR-CON  M) 20 MEQ tablet TAKE 1 TABLET BY MOUTH EVERY DAY   FLUoxetine  (PROZAC ) 10 MG capsule Take 1 capsule (10 mg total) by mouth daily. (Patient not taking: Reported on 08/06/2023)   No facility-administered encounter medications on file as of 08/06/2023.    No Known Allergies  Pertinent ROS per HPI, otherwise unremarkable      Objective:   BP 139/85   Pulse (!) 103   Temp 98 F (36.7 C)   Ht 5\' 3"  (1.6 m)   Wt 226 lb  12.8 oz (102.9 kg)   SpO2 93%   BMI 40.18 kg/m    Wt Readings from Last 3 Encounters:  08/06/23 226 lb 12.8 oz (102.9 kg)  07/25/23 230 lb 3.2 oz (104.4 kg)  01/16/23 230 lb 3.2 oz (104.4 kg)    Physical Exam Vitals and nursing note reviewed.  Constitutional:      Appearance: Normal appearance. She is morbidly obese.  HENT:     Head: Normocephalic and atraumatic.     Nose: Nose normal.     Mouth/Throat:     Mouth: Mucous membranes are moist.  Eyes:     Pupils: Pupils are equal, round, and reactive to light.  Cardiovascular:     Rate and Rhythm: Normal rate and regular rhythm.     Heart sounds: Normal heart sounds.  Pulmonary:     Effort: Pulmonary effort is normal.     Breath sounds: Normal breath sounds.  Skin:    General: Skin is warm and dry.     Capillary Refill: Capillary refill takes less than 2 seconds.  Neurological:     General: No focal deficit present.     Mental Status: She is alert and oriented to person, place, and time.  Psychiatric:        Attention and Perception: Attention and perception normal.        Mood and Affect: Mood normal. Affect is tearful.        Speech: Speech normal.        Behavior: Behavior normal. Behavior is cooperative.        Thought Content: Thought content normal.        Cognition and Memory: Cognition and memory normal.        Judgment: Judgment normal.       Results for orders placed or performed in visit on 07/25/23  CMP14+EGFR   Collection Time: 07/25/23  4:19 PM  Result Value Ref Range   Glucose 88 70 - 99 mg/dL   BUN 13 6 - 24 mg/dL   Creatinine, Ser 1.61 0.57 - 1.00 mg/dL   eGFR 81 >09 UE/AVW/0.98   BUN/Creatinine Ratio 16 9 - 23   Sodium 141 134 - 144 mmol/L   Potassium 3.6 3.5 - 5.2 mmol/L   Chloride 102 96 - 106 mmol/L   CO2 21 20 - 29 mmol/L   Calcium  9.7 8.7 - 10.2 mg/dL   Total Protein 7.1 6.0 - 8.5 g/dL   Albumin 4.3  3.8 - 4.9 g/dL   Globulin, Total 2.8 1.5 - 4.5 g/dL   Bilirubin Total 0.4 0.0 - 1.2 mg/dL   Alkaline Phosphatase 122 (H) 44 - 121 IU/L   AST 19 0 - 40 IU/L   ALT 16 0 - 32 IU/L  CBC with Differential/Platelet   Collection Time: 07/25/23  4:19 PM  Result Value Ref Range   WBC 7.3 3.4 - 10.8 x10E3/uL   RBC 4.61 3.77 - 5.28 x10E6/uL   Hemoglobin 11.9 11.1 - 15.9 g/dL   Hematocrit 11.9 14.7 - 46.6 %   MCV 80 79 - 97 fL   MCH 25.8 (L) 26.6 - 33.0 pg   MCHC 32.2 31.5 - 35.7 g/dL   RDW 82.9 56.2 - 13.0 %   Platelets 390 150 - 450 x10E3/uL   Neutrophils 45 Not Estab. %   Lymphs 45 Not Estab. %   Monocytes 7 Not Estab. %   Eos 2 Not Estab. %   Basos 1 Not Estab. %   Neutrophils  Absolute 3.3 1.4 - 7.0 x10E3/uL   Lymphocytes Absolute 3.3 (H) 0.7 - 3.1 x10E3/uL   Monocytes Absolute 0.5 0.1 - 0.9 x10E3/uL   EOS (ABSOLUTE) 0.2 0.0 - 0.4 x10E3/uL   Basophils Absolute 0.0 0.0 - 0.2 x10E3/uL   Immature Granulocytes 0 Not Estab. %   Immature Grans (Abs) 0.0 0.0 - 0.1 x10E3/uL  Lipid panel   Collection Time: 07/25/23  4:19 PM  Result Value Ref Range   Cholesterol, Total 152 100 - 199 mg/dL   Triglycerides 098 (H) 0 - 149 mg/dL   HDL 32 (L) >11 mg/dL   VLDL Cholesterol Cal 33 5 - 40 mg/dL   LDL Chol Calc (NIH) 87 0 - 99 mg/dL   Chol/HDL Ratio 4.8 (H) 0.0 - 4.4 ratio  Thyroid  Panel With TSH   Collection Time: 07/25/23  4:19 PM  Result Value Ref Range   TSH 0.928 0.450 - 4.500 uIU/mL   T4, Total 8.0 4.5 - 12.0 ug/dL   T3 Uptake Ratio 27 24 - 39 %   Free Thyroxine Index 2.2 1.2 - 4.9  VITAMIN D  25 Hydroxy (Vit-D Deficiency, Fractures)   Collection Time: 07/25/23  4:19 PM  Result Value Ref Range   Vit D, 25-Hydroxy 18.0 (L) 30.0 - 100.0 ng/mL       Pertinent labs & imaging results that were available during my care of the patient were reviewed by me and considered in my medical decision making.  Assessment & Plan:  Bralyn was seen today for anxiety.  Diagnoses and all orders for  this visit:  Situational mixed anxiety and depressive disorder -     hydrOXYzine (VISTARIL) 25 MG capsule; Take 1 capsule (25 mg total) by mouth every 8 (eight) hours as needed for anxiety.     Assessment and Plan    Anxiety Increased anxiety due to husband's health condition and work-related stress. She has not yet started prescribed medications but plans to do so. Hydroxyzine prescribed for acute anxiety episodes with caution about potential drowsiness. Prozac  prescribed for daily use to manage anxiety. - Prescribe hydroxyzine as needed for acute anxiety episodes, cautioning about potential drowsiness. - Instruct her to start taking Prozac  daily, preferably at the same time each day. - Schedule follow-up appointment in four weeks to reassess anxiety and medication efficacy.  Hypertension Elevated blood pressure likely due to increased stress related to husband's health condition and work-related issues. She reports inability to work due to stress and elevated blood pressure. - Provide note for short-term disability starting from yesterday until cleared by provider. - Instruct her to have employer fax necessary paperwork for short-term disability to the office.          Continue all other maintenance medications.  Follow up plan: Return in 4 weeks (on 09/03/2023), or if symptoms worsen or fail to improve, for Depression, Anxiety.   Continue healthy lifestyle choices, including diet (rich in fruits, vegetables, and lean proteins, and low in salt and simple carbohydrates) and exercise (at least 30 minutes of moderate physical activity daily).  Educational handout given for GAD  The above assessment and management plan was discussed with the patient. The patient verbalized understanding of and has agreed to the management plan. Patient is aware to call the clinic if they develop any new symptoms or if symptoms persist or worsen. Patient is aware when to return to the clinic for a  follow-up visit. Patient educated on when it is appropriate to go to the emergency department.  Kattie Parrot, FNP-C [facility] [phone]

## 2023-08-12 ENCOUNTER — Telehealth: Payer: Self-pay | Admitting: Family Medicine

## 2023-08-12 NOTE — Telephone Encounter (Unsigned)
 Copied from CRM [id number]. Topic: General - Other >> Aug 12, 2023  8:17 AM Everette C wrote: Reason for CRM: The patient would like to speak with staff member Caryl Clas when possible to continue ongoing discussions related to their FMLA paperwork and confirmation that it has been submitted as instructed. Please contact further if/when available

## 2023-08-12 NOTE — Telephone Encounter (Signed)
 Called patient and it was faxed

## 2023-08-13 ENCOUNTER — Telehealth: Payer: Self-pay | Admitting: Family Medicine

## 2023-08-13 NOTE — Telephone Encounter (Signed)
 Left message for her to call back

## 2023-08-13 NOTE — Telephone Encounter (Signed)
 Copied from CRM [id number]. Topic: General - Other >> Aug 12, 2023  4:55 PM Adrianna P wrote: Reason for CRM: patient called to speak with Odilia Bennett, please contact patient

## 2023-08-14 ENCOUNTER — Encounter: Payer: Self-pay | Admitting: Family Medicine

## 2023-08-14 NOTE — Telephone Encounter (Signed)
 Letter has been sent for dismissal.

## 2023-08-29 ENCOUNTER — Ambulatory Visit (HOSPITAL_BASED_OUTPATIENT_CLINIC_OR_DEPARTMENT_OTHER): Admitting: Family Medicine

## 2023-08-29 ENCOUNTER — Encounter (HOSPITAL_BASED_OUTPATIENT_CLINIC_OR_DEPARTMENT_OTHER): Payer: Self-pay | Admitting: Family Medicine

## 2023-08-29 VITALS — BP 140/90 | HR 90 | Ht 62.0 in | Wt 224.7 lb

## 2023-08-29 DIAGNOSIS — F419 Anxiety disorder, unspecified: Secondary | ICD-10-CM

## 2023-08-29 DIAGNOSIS — I10 Essential (primary) hypertension: Secondary | ICD-10-CM

## 2023-08-29 DIAGNOSIS — F32A Depression, unspecified: Secondary | ICD-10-CM | POA: Insufficient documentation

## 2023-08-29 NOTE — Patient Instructions (Addendum)
 Start your fluoxetine  10mg  daily   Please have your work fax the paperwork to [facility]  Fax [phone]

## 2023-08-29 NOTE — Progress Notes (Signed)
 New Patient Office Visit  Subjective:   Cynthia [name] March 30, 1966 08/29/2023  Chief Complaint  Patient presents with   New Patient (Initial Visit)    Establishing care    HPI: Cynthia [name] presents today to establish care at [facility]. Introduced to Publishing rights manager role and practice setting.  All questions answered.   Last PCP: [facility] Concerns: See below   Patient reports she was placed on leave for her husband's end of life care. She states her prior PCP had dismissed her from the practice due to miscommunication. Patient recently was told her husband has 2 weeks to live and is visibly upset. She is very tearful and has uncontrolled anxiety, depression due to ongoing stressors. She states her husband is in ICU. Patient is requesting to have her short-term disability paperwork completed for an extension of at least 4 more weeks as has been is receiving end-of-life care in the ICU currently.  Patient states she was placed on fluoxetine  for uncontrolled anxiety and hydroxyzine  as needed for sleep from her last PCP, but she has not started either medications.  She denies impairment with sleep currently, but states anxiety and depression are uncontrolled.     08/06/2023   10:49 AM 07/25/2023    3:59 PM 09/11/2022    8:03 AM 09/11/2021    9:01 AM  GAD 7 : Generalized Anxiety Score  Nervous, Anxious, on Edge 3 1 3  0  Control/stop worrying 3 1 3  0  Worry too much - different things 3 1 3  0  Trouble relaxing 3 1 3  0  Restless 3 1 0 0  Easily annoyed or irritable 3 1 0 0  Afraid - awful might happen 3 1 0 0  Total GAD 7 Score 21 7 12  0  Anxiety Difficulty Extremely difficult Not difficult at all Not difficult at all Not difficult at all       08/06/2023   10:49 AM 07/25/2023    3:58 PM 09/11/2022    8:03 AM 09/11/2021    9:01 AM 09/05/2021   11:24 AM  Depression screen PHQ 2/9  Decreased  Interest 3 0 0 0 0  Down, Depressed, Hopeless 3 1 0 0 0  PHQ - 2 Score 6 1 0 0 0  Altered sleeping 3 3 3  0 0  Tired, decreased energy 1 3 3  0 0  Change in appetite 0 3 1 0 0  Feeling bad or failure about yourself  3 3 0 0 0  Trouble concentrating 1 2 0 0 0  Moving slowly or fidgety/restless 0 0 0 0 0  Suicidal thoughts 0 0 0 0 0  PHQ-9 Score 14 15 7  0 0  Difficult doing work/chores Very difficult Not difficult at all Not difficult at all Not difficult at all Not difficult at all     The following portions of the patient's history were reviewed and updated as appropriate: past medical history, past surgical history, family history, social history, allergies, medications, and problem list.   Patient Active Problem List   Diagnosis Date Noted   Anxiety and depression 08/29/2023   Situational mixed anxiety and depressive disorder 08/06/2023   Pure hypercholesterolemia 03/22/2023   Hypokalemia 03/22/2023   Family history of myocardial infarction 09/11/2022   Vitamin D  deficiency 05/09/2021   Morbid obesity (HCC) 05/09/2021   Mixed hyperlipidemia 05/09/2021   Simple chronic bronchitis (HCC) 01/03/2021   Allergic rhinitis due  to pollen 01/15/2018   Reactive airway disease 01/15/2018   Headache syndrome 04/13/2016   Chronic maxillary sinusitis 01/23/2016   History of uterine fibroid 01/23/2016   S/P hysterectomy 01/23/2016   Body mass index 36.0-36.9, adult 01/23/2016   Essential hypertension 12/15/2015   Slow transit constipation 12/15/2015   Past Medical History:  Diagnosis Date   Allergy    High cholesterol    Hypertension    Migraine    Vertigo    Past Surgical History:  Procedure Laterality Date   ABDOMINAL HYSTERECTOMY     BREAST BIOPSY Left 2018   BREAST EXCISIONAL BIOPSY Left    BREAST SURGERY Left    lump removed    CESAREAN SECTION     x2   CYST REMOVAL LEG     FRACTURE SURGERY Right    Arm   TONSILLECTOMY     Family History  Problem Relation Age of  Onset   Heart failure Mother    Hypertension Father    Kidney disease Father    Cancer Maternal Grandmother    Cancer Maternal Aunt    Colon cancer Neg Hx    Esophageal cancer Neg Hx    Rectal cancer Neg Hx    Stomach cancer Neg Hx    Social History   Socioeconomic History   Marital status: Married    Spouse name: Not on file   Number of children: Not on file   Years of education: Not on file   Highest education level: Not on file  Occupational History   Not on file  Tobacco Use   Smoking status: Former    Current packs/day: 0.00    Average packs/day: 0.5 packs/day for 10.0 years (5.0 ttl pk-yrs)    Types: Cigarettes    Start date: 06/04/2006    Quit date: 06/04/2016    Years since quitting: 7.2   Smokeless tobacco: Never  Vaping Use   Vaping status: Never Used  Substance and Sexual Activity   Alcohol use: Yes    Comment: occ   Drug use: No   Sexual activity: Not Currently    Partners: Male    Comment: Married  Other Topics Concern   Not on file  Social History Narrative   Lives at home w/ her husband   Left-handed   Caffeine: 2-3 sodas per day   Social Drivers of Corporate investment banker Strain: Not on file  Food Insecurity: Not on file  Transportation Needs: Not on file  Physical Activity: Not on file  Stress: Not on file  Social Connections: Not on file  Intimate Partner Violence: Not on file   Outpatient Medications Prior to Visit  Medication Sig Dispense Refill   Albuterol -Budesonide  (AIRSUPRA ) 90-80 MCG/ACT AERO Inhale 2 Inhalations into the lungs once as needed for up to 1 dose (MAX 6 doses (12 inhalations) per 24 hours). 10.7 g 5   amLODipine  (NORVASC ) 10 MG tablet TAKE 1 TABLET BY MOUTH EVERY DAY 90 tablet 1   atorvastatin  (LIPITOR) 20 MG tablet TAKE 1 TABLET BY MOUTH EVERY DAY 90 tablet 1   Budeson-Glycopyrrol-Formoterol  (BREZTRI  AEROSPHERE) 160-9-4.8 MCG/ACT AERO Inhale 2 puffs into the lungs 2 (two) times daily. 10.7 g 11   cetirizine   (ZYRTEC ) 10 MG tablet Take 1 tablet (10 mg total) by mouth daily. 30 tablet 11   FLUoxetine  (PROZAC ) 10 MG capsule Take 1 capsule (10 mg total) by mouth daily. 90 capsule 3   fluticasone  (FLONASE ) 50 MCG/ACT nasal spray Place 2  sprays into both nostrils daily. 16 g 6   hydrochlorothiazide  (HYDRODIURIL ) 25 MG tablet TAKE 1 TABLET (25 MG TOTAL) BY MOUTH DAILY. 90 tablet 0   hydrOXYzine  (VISTARIL ) 25 MG capsule Take 1 capsule (25 mg total) by mouth every 8 (eight) hours as needed for anxiety. 30 capsule 0   montelukast  (SINGULAIR ) 10 MG tablet Take 1 tablet (10 mg total) by mouth at bedtime. 30 tablet 3   potassium chloride  SA (KLOR-CON  M) 20 MEQ tablet TAKE 1 TABLET BY MOUTH EVERY DAY 90 tablet 0   levalbuterol  (XOPENEX  HFA) 45 MCG/ACT inhaler Inhale 2 puffs into the lungs every 6 (six) hours as needed for wheezing or shortness of breath (to REPLACE albuterol ). 1 each 12   No facility-administered medications prior to visit.   No Known Allergies  ROS: A complete ROS was performed with pertinent positives/negatives noted in the HPI. The remainder of the ROS are negative.   Objective:   Today's Vitals   08/29/23 1011  BP: (!) 153/92  Pulse: 90  SpO2: 95%  Weight: 224 lb 11.2 oz (101.9 kg)  Height: 5\' 2"  (1.575 m)  PainSc: 0-No pain    GENERAL: Well-appearing, in NAD. Well nourished.  SKIN: Pink, warm and dry.  Head: Normocephalic. NECK: Trachea midline. Full ROM w/o pain or tenderness.  RESPIRATORY: Chest wall symmetrical. Respirations even and non-labored.  MSK: Muscle tone and strength appropriate for age.  NEUROLOGIC: No motor or sensory deficits. Steady, even gait. C2-C12 intact.  PSYCH/MENTAL STATUS: Alert, oriented x 3. Cooperative, very tearful, anxious mood and affect.      Assessment & Plan:  1. Anxiety and depression (Primary) Uncontrolled. Discussed grieving and recommend patient start taking fluoxetine  10 mg as prescribed by previous provider.  Safe use of medication  reviewed with patient.  Will complete patient's short-term disability and paperwork.  2. Essential hypertension Likely increased due to ongoing stress and anxiety.  Patient very tearful in the office.  Recommend she continue taking medications as directed and monitor her blood pressure regularly at home with goal of less than 140/80.  If not improved in the next week, she is to reach out to PCP and we will titrate her medication.   Patient to reach out to office if new, worrisome, or unresolved symptoms arise or if no improvement in patient's condition. Patient verbalized understanding and is agreeable to treatment plan. All questions answered to patient's satisfaction.    Return in about 3 months (around 11/29/2023) for ANNUAL PHYSICAL, HYPERTENSION, hyperlipidemia (fasting labs prior) .    Nonda Bays, [place]

## 2023-09-03 ENCOUNTER — Ambulatory Visit: Admitting: Family Medicine

## 2023-09-22 ENCOUNTER — Other Ambulatory Visit: Payer: Self-pay | Admitting: Family Medicine

## 2023-09-22 DIAGNOSIS — E876 Hypokalemia: Secondary | ICD-10-CM

## 2023-09-26 ENCOUNTER — Other Ambulatory Visit: Payer: Self-pay | Admitting: Family Medicine

## 2023-09-26 DIAGNOSIS — I10 Essential (primary) hypertension: Secondary | ICD-10-CM

## 2023-09-26 DIAGNOSIS — E7800 Pure hypercholesterolemia, unspecified: Secondary | ICD-10-CM

## 2023-09-27 ENCOUNTER — Telehealth (HOSPITAL_BASED_OUTPATIENT_CLINIC_OR_DEPARTMENT_OTHER): Admitting: Family Medicine

## 2023-09-27 ENCOUNTER — Encounter (HOSPITAL_BASED_OUTPATIENT_CLINIC_OR_DEPARTMENT_OTHER): Payer: Self-pay | Admitting: Family Medicine

## 2023-09-27 DIAGNOSIS — F4323 Adjustment disorder with mixed anxiety and depressed mood: Secondary | ICD-10-CM | POA: Diagnosis not present

## 2023-09-27 DIAGNOSIS — F4321 Adjustment disorder with depressed mood: Secondary | ICD-10-CM

## 2023-09-27 MED ORDER — FLUOXETINE HCL 10 MG PO CAPS: 10.0000 mg | ORAL_CAPSULE | Freq: Every day | ORAL | 0 refills | Status: DC

## 2023-09-27 MED ORDER — HYDROXYZINE PAMOATE 25 MG PO CAPS: 25.0000 mg | ORAL_CAPSULE | Freq: Three times a day (TID) | ORAL | 0 refills | Status: DC | PRN

## 2023-09-27 NOTE — Progress Notes (Signed)
   Virtual Visit  I connected with  Cynthia [name]  on 11/14/23 by telehealth and verified that I am speaking with the correct person using two identifiers. Visit completed via video.  I discussed the limitations, risks, security and privacy concerns of performing an evaluation and management service by telephone, including the higher likelihood of inaccurate diagnosis and treatment, and the availability of in person appointments.  We also discussed the likely need of an additional face to face encounter for complete and high quality delivery of care.  I also discussed with the patient that there may be a patient responsible charge related to this service. The patient expressed understanding and wishes to proceed.  Provider location is in medical facility. Patient location is at their home, different from provider location. People involved in care of the patient during this telehealth encounter were myself, my nurse/medical assistant, and my front office/scheduling team member.  Review of Systems: No fevers, chills, night sweats, weight loss, chest pain, or shortness of breath.   Objective Findings:    General: Speaking full sentences, no audible heavy breathing.  Sounds alert and appropriately interactive.    Independent interpretation of tests performed by another provider:   None.  Brief History, Exam, Impression, and Recommendations:    Situational mixed anxiety and depressive disorder Patient presents with ongoing difficulty related to underlying anxiety and trouble sleeping.  Symptoms are primarily related to grief reaction related to recent passing of her husband.  On review of chart, she has been prescribed medications in the past including fluoxetine  and hydroxyzine .  These were initially prescribed by her prior PCP before establishing with our office.  She indicates that she did have penicillins ultimately did not start these medications when they were prescribed.  She presents  with continued concerns related to medications. We discussed considerations today related to medication management, we discussed potential risks and benefits related to medications which have been prescribed previously, specifically reviewing fluoxetine  and hydroxyzine .  We discussed additional measures to assist with navigating recent passing of her husband.  She does plan to follow-up with her PCP here in our office in the near future for continued monitoring.  Advised that if she does have a concerns that do arise in the interim, can return to office sooner as needed  I discussed the above assessment and treatment plan with the patient. The patient was provided an opportunity to ask questions and all were answered. The patient agreed with the plan and demonstrated an understanding of the instructions.  The patient was advised to call back or seek an in-person evaluation if the symptoms worsen or if the condition fails to improve as anticipated.  I provided 16 minutes of face to face and non-face-to-face time during this encounter date, time was needed to gather information, review chart, records, communicate/coordinate with staff remotely, as well as complete documentation.   ___________________________________________ [name], MD, ABFM, CAQSM Primary Care and Sports Medicine [facility]

## 2023-10-08 ENCOUNTER — Telehealth (HOSPITAL_BASED_OUTPATIENT_CLINIC_OR_DEPARTMENT_OTHER): Payer: Self-pay | Admitting: Family Medicine

## 2023-10-08 ENCOUNTER — Other Ambulatory Visit (HOSPITAL_BASED_OUTPATIENT_CLINIC_OR_DEPARTMENT_OTHER): Payer: Self-pay | Admitting: Family Medicine

## 2023-10-08 DIAGNOSIS — I10 Essential (primary) hypertension: Secondary | ICD-10-CM

## 2023-10-08 MED ORDER — HYDROCHLOROTHIAZIDE 25 MG PO TABS: 25.0000 mg | ORAL_TABLET | Freq: Every day | ORAL | 1 refills | Status: DC

## 2023-10-08 MED ORDER — AMLODIPINE BESYLATE 10 MG PO TABS: ORAL_TABLET | ORAL | 1 refills | Status: DC

## 2023-10-08 NOTE — Telephone Encounter (Signed)
 Routing to [employer] as an Financial planner. Pt has appt tomorrow 7/2.

## 2023-10-08 NOTE — Telephone Encounter (Signed)
 When calling the patient to remind them about their appointment tomorrow the patient stated none of her medication for her blood pressure (3 prescriptions) have been called in by our office.  The patient states she does not have any more medication to take today.  Please advise.  Thank you!

## 2023-10-09 ENCOUNTER — Encounter (HOSPITAL_BASED_OUTPATIENT_CLINIC_OR_DEPARTMENT_OTHER): Payer: Self-pay | Admitting: Family Medicine

## 2023-10-09 ENCOUNTER — Ambulatory Visit (INDEPENDENT_AMBULATORY_CARE_PROVIDER_SITE_OTHER): Admitting: Family Medicine

## 2023-10-09 VITALS — BP 130/86 | HR 94 | Ht 62.0 in | Wt 222.0 lb

## 2023-10-09 DIAGNOSIS — E7800 Pure hypercholesterolemia, unspecified: Secondary | ICD-10-CM | POA: Diagnosis not present

## 2023-10-09 DIAGNOSIS — F4323 Adjustment disorder with mixed anxiety and depressed mood: Secondary | ICD-10-CM

## 2023-10-09 MED ORDER — ALPRAZOLAM 0.25 MG PO TABS: 0.1250 mg | ORAL_TABLET | Freq: Every evening | ORAL | 0 refills | Status: AC | PRN

## 2023-10-09 MED ORDER — ATORVASTATIN CALCIUM 20 MG PO TABS: ORAL_TABLET | ORAL | 2 refills | Status: AC

## 2023-10-09 NOTE — Progress Notes (Signed)
 Subjective:   Cynthia [name] 1966/04/06 10/09/2023  Chief Complaint  Patient presents with   Medical Management of Chronic Issues    Pt is here today to discuss needing extended time off. States she did start therapy 2 days ago.    HPI: [name] presents today for re-assessment and management of chronic medical conditions.  ANXIETY: [name] presents for the medical management of anxiety. Patient reports she was placed on leave for her husband's end of life care since 09/03/2023. She states her husband passed on 10/19/2023.  She reports impairment with sleep currently, and states anxiety and depression are uncontrolled. She has not started her Fluoxetine  yet due to fears of medication side effects.  She is starting counseling with hospice in the next week. She is needing a renewal of her work leave.  Denies SI/HI.     10/09/2023   10:45 AM 09/27/2023   10:13 AM 08/06/2023   10:49 AM 07/25/2023    3:59 PM  GAD 7 : Generalized Anxiety Score  Nervous, Anxious, on Edge 3 3 3 1   Control/stop worrying 3 3 3 1   Worry too much - different things 3 3 3 1   Trouble relaxing 3 3 3 1   Restless 3 3 3 1   Easily annoyed or irritable 3 3 3 1   Afraid - awful might happen 3 3 3 1   Total GAD 7 Score 21 21 21 7   Anxiety Difficulty Somewhat difficult Somewhat difficult Extremely difficult Not difficult at all      10/09/2023   10:45 AM 09/27/2023   10:13 AM 08/06/2023   10:49 AM 07/25/2023    3:58 PM 09/11/2022    8:03 AM  Depression screen PHQ 2/9  Decreased Interest 3 3 3  0 0  Down, Depressed, Hopeless 3 3 3 1  0  PHQ - 2 Score 6 6 6 1  0  Altered sleeping 3 3 3 3 3   Tired, decreased energy 3 3 1 3 3   Change in appetite 0 0 0 3 1  Feeling bad or failure about yourself  2 2 3 3  0  Trouble concentrating 0 0 1 2 0  Moving slowly or fidgety/restless 3 3 0 0 0  Suicidal thoughts 0 0 0 0 0  PHQ-9 Score 17 17 14 15 7   Difficult doing work/chores Somewhat difficult Somewhat  difficult Very difficult Not difficult at all Not difficult at all      The following portions of the patient's history were reviewed and updated as appropriate: past medical history, past surgical history, family history, social history, allergies, medications, and problem list.   Patient Active Problem List   Diagnosis Date Noted   Anxiety and depression 08/29/2023   Situational mixed anxiety and depressive disorder 08/06/2023   Pure hypercholesterolemia 03/22/2023   Hypokalemia 03/22/2023   Family history of myocardial infarction 09/11/2022   Vitamin D  deficiency 05/09/2021   Morbid obesity (HCC) 05/09/2021   Mixed hyperlipidemia 05/09/2021   Simple chronic bronchitis (HCC) 01/03/2021   Allergic rhinitis due to pollen 01/15/2018   Reactive airway disease 01/15/2018   Headache syndrome 04/13/2016   Chronic maxillary sinusitis 01/23/2016   History of uterine fibroid 01/23/2016   S/P hysterectomy 01/23/2016   Body mass index 36.0-36.9, adult 01/23/2016   Essential hypertension 12/15/2015   Slow transit constipation 12/15/2015   Past Medical History:  Diagnosis Date   Allergy    High cholesterol    Hypertension    Migraine  Vertigo    Past Surgical History:  Procedure Laterality Date   ABDOMINAL HYSTERECTOMY     BREAST BIOPSY Left 2018   BREAST EXCISIONAL BIOPSY Left    BREAST SURGERY Left    lump removed    CESAREAN SECTION     x2   CYST REMOVAL LEG     FRACTURE SURGERY Right    Arm   TONSILLECTOMY     Family History  Problem Relation Age of Onset   Heart failure Mother    Hypertension Father    Kidney disease Father    Cancer Maternal Grandmother    Cancer Maternal Aunt    Colon cancer Neg Hx    Esophageal cancer Neg Hx    Rectal cancer Neg Hx    Stomach cancer Neg Hx    Outpatient Medications Prior to Visit  Medication Sig Dispense Refill   Albuterol -Budesonide  (AIRSUPRA ) 90-80 MCG/ACT AERO Inhale 2 Inhalations into the lungs once as needed for up  to 1 dose (MAX 6 doses (12 inhalations) per 24 hours). 10.7 g 5   amLODipine  (NORVASC ) 10 MG tablet TAKE 1 TABLET BY MOUTH EVERY DAY 90 tablet 1   Budeson-Glycopyrrol-Formoterol  (BREZTRI  AEROSPHERE) 160-9-4.8 MCG/ACT AERO Inhale 2 puffs into the lungs 2 (two) times daily. 10.7 g 11   cetirizine  (ZYRTEC ) 10 MG tablet Take 1 tablet (10 mg total) by mouth daily. 30 tablet 11   FLUoxetine  (PROZAC ) 10 MG capsule Take 1 capsule (10 mg total) by mouth daily. 30 capsule 0   fluticasone  (FLONASE ) 50 MCG/ACT nasal spray Place 2 sprays into both nostrils daily. 16 g 6   hydrochlorothiazide  (HYDRODIURIL ) 25 MG tablet Take 1 tablet (25 mg total) by mouth daily. 90 tablet 1   montelukast  (SINGULAIR ) 10 MG tablet Take 1 tablet (10 mg total) by mouth at bedtime. 30 tablet 3   potassium chloride  SA (KLOR-CON  M) 20 MEQ tablet TAKE 1 TABLET BY MOUTH EVERY DAY 90 tablet 0   atorvastatin  (LIPITOR) 20 MG tablet TAKE 1 TABLET BY MOUTH EVERY DAY 90 tablet 1   hydrOXYzine  (VISTARIL ) 25 MG capsule Take 1 capsule (25 mg total) by mouth every 8 (eight) hours as needed for anxiety. 30 capsule 0   No facility-administered medications prior to visit.   No Known Allergies   ROS: A complete ROS was performed with pertinent positives/negatives noted in the HPI. The remainder of the ROS are negative.    Objective:   Today's Vitals   10/09/23 1042 10/09/23 1133  BP: (!) 150/89 130/86  Pulse: 94   SpO2: 97%   Weight: 222 lb (100.7 kg)   Height: 5' 2 (1.575 m)     Physical Exam          GENERAL: Well-appearing, in NAD. Well nourished.  SKIN: Pink, warm and dry.  Head: Normocephalic. NECK: Trachea midline. Full ROM w/o pain or tenderness.  RESPIRATORY: Chest wall symmetrical. Respirations even and non-labored. CARDIAC: S1, S2 present, regular rate and rhythm without murmur or gallops. Peripheral pulses 2+ bilaterally.  MSK: Muscle tone and strength appropriate for age.  NEUROLOGIC: No motor or sensory deficits.  Steady, even gait. C2-C12 intact.  PSYCH/MENTAL STATUS: Alert, oriented x 3. Tearful affect.   Health Maintenance Due  Topic Date Due   DTaP/Tdap/Td (1 - Tdap) Never done   Pneumococcal Vaccine 37-72 Years old (1 of 2 - PCV) Never done   Zoster Vaccines- Shingrix (1 of 2) Never done   COVID-19 Vaccine (1 - 2024-25 season) Never done  Colonoscopy  05/17/2023   MAMMOGRAM  06/15/2023    No results found for any visits on 10/09/23.  The 10-year ASCVD risk score (Arnett DK, et al., 2019) is: 7.6%     Assessment & Plan:  1. Situational mixed anxiety and depressive disorder (Primary) Uncontrolled. Counseled patient regarding grief process and recommend she start Fluoxetine  10mg  as directed. Alprazolam given with short supply to use in times of severe anxiety at night. Safe use of medication reviewed with patient and she verbalized understanding. PDMP reviewed.  - ALPRAZolam (XANAX) 0.25 MG tablet; Take 0.5-1 tablets (0.125-0.25 mg total) by mouth at bedtime as needed for anxiety.  Dispense: 20 tablet; Refill: 0  2. Pure hypercholesterolemia Pt requested refill. Sent in. Will return for follow up in August.  - atorvastatin  (LIPITOR) 20 MG tablet; TAKE 1 TABLET BY MOUTH EVERY DAY  Dispense: 90 tablet; Refill: 2   Meds ordered this encounter  Medications   ALPRAZolam (XANAX) 0.25 MG tablet    Sig: Take 0.5-1 tablets (0.125-0.25 mg total) by mouth at bedtime as needed for anxiety.    Dispense:  20 tablet    Refill:  0    Supervising Provider:   DE PERU, RAYMOND J [[id number]]   atorvastatin  (LIPITOR) 20 MG tablet    Sig: TAKE 1 TABLET BY MOUTH EVERY DAY    Dispense:  90 tablet    Refill:  2    Supervising Provider:   DE PERU, RAYMOND J [[id number]]   Lab Orders  No laboratory test(s) ordered today   Return if symptoms worsen or fail to improve.    Patient to reach out to office if new, worrisome, or unresolved symptoms arise or if no improvement in patient's condition. Patient  verbalized understanding and is agreeable to treatment plan. All questions answered to patient's satisfaction.    Thersia Schuyler Stark, [place]

## 2023-10-09 NOTE — Patient Instructions (Signed)
 Please have short term disability forms sent to our office. Fax : [phone] Phone: [phone]    Please restart your blood pressure medication and keep a log of blood pressure. Goal is less 130/80.

## 2023-11-04 ENCOUNTER — Telehealth (HOSPITAL_BASED_OUTPATIENT_CLINIC_OR_DEPARTMENT_OTHER): Payer: Self-pay | Admitting: *Deleted

## 2023-11-04 NOTE — Telephone Encounter (Signed)
**Note De-identified  [place] Obfuscation** Please advise 

## 2023-11-04 NOTE — Telephone Encounter (Signed)
 Copied from CRM #[id number]. Topic: Appointments - Scheduling Inquiry for Clinic >> Nov 04, 2023 11:55 AM Cynthia [name] wrote: Reason for CRM: The patient called in not wanting to give too much information other than she wants to see her provider for a follow up for previous concerns. She was seen recently regarding anxiety and depression and after checking her provider does not have anything available for a month. She said know she really needs to be seen sooner. I called and spoke with Keona and she said she will speak with the provider and get back with the patient. I let the patient know and she said ok

## 2023-11-05 ENCOUNTER — Other Ambulatory Visit (HOSPITAL_BASED_OUTPATIENT_CLINIC_OR_DEPARTMENT_OTHER): Payer: Self-pay | Admitting: Family Medicine

## 2023-11-05 ENCOUNTER — Encounter (HOSPITAL_BASED_OUTPATIENT_CLINIC_OR_DEPARTMENT_OTHER): Payer: Self-pay | Admitting: Family Medicine

## 2023-11-05 ENCOUNTER — Telehealth (HOSPITAL_BASED_OUTPATIENT_CLINIC_OR_DEPARTMENT_OTHER): Admitting: Family Medicine

## 2023-11-05 VITALS — Ht 62.0 in | Wt 225.0 lb

## 2023-11-05 DIAGNOSIS — F4323 Adjustment disorder with mixed anxiety and depressed mood: Secondary | ICD-10-CM | POA: Diagnosis not present

## 2023-11-05 NOTE — Progress Notes (Signed)
 Virtual Video Visit  I connected with Cynthia [name] on 11/05/23 at 10:30 AM EDT by a video enabled telemedicine application and verified that I am speaking with the correct person using two identifiers.   Location patient: Home Location provider: Clinic Persons participating in the virtual visit: Patient; Damien Many CMA; Thersia Stark, FNP-C  I discussed the limitations of evaluation and management by telemedicine and the availability of in-person appointments. The patient expressed understanding and agreed to proceed.  Chief Complaint  Patient presents with   Anxiety    Called and spoke with pt. Pt is having problems with anxiety due to job situation.    SUBJECTIVE:  HPI:  ANXIETY AND DEPRESSION: Cynthia [name] presents for the medical management of anxiety and depression. Patient recently lost her spouse and has been treated by PCP in the past 2-3 months for anxiety with recommendation to start Fluoxetine  and use Alprazolam  PRN for breakthrough panic symptoms.  Patient states her family had stated that she has been more upset and 'snappy' at times. She feels that anxiety and depression has slightly improved but still finding herself grieving frequently. She states she has not started fluoxetine  due to concern of possible side effects. She states she is going to church and has a good supportive network. She has started therapy with counselor and goes to Hospice counseling tomorrow. She states she is active at her house doing household chores, gardening and getting back to activities. She states her sleep is impaired but will use Cold medicine if needed. She has not taken Alprazolam  PRN due to side effect concerns. She states she does want to start her medications and continue her counseling with therapist. She is desiring to return to work on 12/10/2023 per recommendation with her therapist.   Denies SI/HI.     11/05/2023   10:33 AM 10/09/2023   10:45 AM 09/27/2023   10:13 AM  08/06/2023   10:49 AM  GAD 7 : Generalized Anxiety Score  Nervous, Anxious, on Edge 1 3 3 3   Control/stop worrying 3 3 3 3   Worry too much - different things 1 3 3 3   Trouble relaxing 1 3 3 3   Restless 1 3 3 3   Easily annoyed or irritable 1 3 3 3   Afraid - awful might happen 1 3 3 3   Total GAD 7 Score 9 21 21 21   Anxiety Difficulty Somewhat difficult Somewhat difficult Somewhat difficult Extremely difficult      11/05/2023   10:31 AM 10/09/2023   10:45 AM 09/27/2023   10:13 AM 08/06/2023   10:49 AM 07/25/2023    3:58 PM  Depression screen PHQ 2/9  Decreased Interest 3 3 3 3  0  Down, Depressed, Hopeless 2 3 3 3 1   PHQ - 2 Score 5 6 6 6 1   Altered sleeping 3 3 3 3 3   Tired, decreased energy 2 3 3 1 3   Change in appetite 1 0 0 0 3  Feeling bad or failure about yourself  1 2 2 3 3   Trouble concentrating 1 0 0 1 2  Moving slowly or fidgety/restless 2 3 3  0 0  Suicidal thoughts 0 0 0 0 0  PHQ-9 Score 15 17 17 14 15   Difficult doing work/chores Somewhat difficult Somewhat difficult Somewhat difficult Very difficult Not difficult at all      The following portions of the patient's history were reviewed and updated as appropriate: medical history, surgical history, medications, allergies, social history, and family history.  Past Medical History:  Diagnosis Date   Allergy    High cholesterol    Hypertension    Migraine    Vertigo    Past Surgical History:  Procedure Laterality Date   ABDOMINAL HYSTERECTOMY     BREAST BIOPSY Left 2018   BREAST EXCISIONAL BIOPSY Left    BREAST SURGERY Left    lump removed    CESAREAN SECTION     x2   CYST REMOVAL LEG     FRACTURE SURGERY Right    Arm   TONSILLECTOMY       Current Outpatient Medications:    Albuterol -Budesonide  (AIRSUPRA ) 90-80 MCG/ACT AERO, Inhale 2 Inhalations into the lungs once as needed for up to 1 dose (MAX 6 doses (12 inhalations) per 24 hours)., Disp: 10.7 g, Rfl: 5   ALPRAZolam  (XANAX ) 0.25 MG tablet, Take  0.5-1 tablets (0.125-0.25 mg total) by mouth at bedtime as needed for anxiety., Disp: 20 tablet, Rfl: 0   amLODipine  (NORVASC ) 10 MG tablet, TAKE 1 TABLET BY MOUTH EVERY DAY, Disp: 90 tablet, Rfl: 1   atorvastatin  (LIPITOR) 20 MG tablet, TAKE 1 TABLET BY MOUTH EVERY DAY, Disp: 90 tablet, Rfl: 2   Budeson-Glycopyrrol-Formoterol  (BREZTRI  AEROSPHERE) 160-9-4.8 MCG/ACT AERO, Inhale 2 puffs into the lungs 2 (two) times daily., Disp: 10.7 g, Rfl: 11   cetirizine  (ZYRTEC ) 10 MG tablet, Take 1 tablet (10 mg total) by mouth daily., Disp: 30 tablet, Rfl: 11   FLUoxetine  (PROZAC ) 10 MG capsule, Take 1 capsule (10 mg total) by mouth daily., Disp: 30 capsule, Rfl: 0   fluticasone  (FLONASE ) 50 MCG/ACT nasal spray, Place 2 sprays into both nostrils daily., Disp: 16 g, Rfl: 6   hydrochlorothiazide  (HYDRODIURIL ) 25 MG tablet, Take 1 tablet (25 mg total) by mouth daily., Disp: 90 tablet, Rfl: 1   montelukast  (SINGULAIR ) 10 MG tablet, Take 1 tablet (10 mg total) by mouth at bedtime., Disp: 30 tablet, Rfl: 3   potassium chloride  SA (KLOR-CON  M) 20 MEQ tablet, TAKE 1 TABLET BY MOUTH EVERY DAY, Disp: 90 tablet, Rfl: 0 No Known Allergies  Social History   Socioeconomic History   Marital status: Married    Spouse name: Not on file   Number of children: Not on file   Years of education: Not on file   Highest education level: Not on file  Occupational History   Not on file  Tobacco Use   Smoking status: Former    Current packs/day: 0.00    Average packs/day: 0.5 packs/day for 10.0 years (5.0 ttl pk-yrs)    Types: Cigarettes    Start date: 06/04/2006    Quit date: 06/04/2016    Years since quitting: 7.4   Smokeless tobacco: Never  Vaping Use   Vaping status: Never Used  Substance and Sexual Activity   Alcohol use: Yes    Comment: occ   Drug use: No   Sexual activity: Not Currently    Partners: Male    Comment: Married  Other Topics Concern   Not on file  Social History Narrative   Lives at home w/ her  husband   Left-handed   Caffeine: 2-3 sodas per day   Social Drivers of Corporate investment banker Strain: Not on file  Food Insecurity: Not on file  Transportation Needs: Not on file  Physical Activity: Not on file  Stress: Not on file  Social Connections: Not on file  Intimate Partner Violence: Not on file    Family History  Problem Relation Age  of Onset   Heart failure Mother    Hypertension Father    Kidney disease Father    Cancer Maternal Grandmother    Cancer Maternal Aunt    Colon cancer Neg Hx    Esophageal cancer Neg Hx    Rectal cancer Neg Hx    Stomach cancer Neg Hx      ROS: A complete ROS was performed with pertinent positives/negatives noted in the HPI. The remainder of the ROS are negative.    OBJECTIVE:  VITALS per patient if applicable: Today's Vitals   11/05/23 1034  Weight: 225 lb (102.1 kg)  Height: 5' 2 (1.575 m)   Body mass index is 41.15 kg/m.   GENERAL: Alert and oriented. Appears well and in no acute distress. HEENT: Atraumatic. Conjunctiva clear. No obvious abnormalities on inspection of external nose and ears. NECK: Normal movements of the head and neck. LUNGS: On inspection, no signs of respiratory distress. Breathing rate appears normal. No obvious gross SOB, gasping or wheezing, and no conversational dyspnea. CV: No obvious cyanosis. MS: Moves all visible extremities without noticeable abnormality. PSYCH/NEURO: Pleasant and cooperative. Patient frequently tearful. Judgment and thought process is coherent. Speech and thought processing grossly intact.  ASSESSMENT AND PLAN: 1. Situational mixed anxiety and depressive disorder (Primary) Improving per patient and increasing her activities at home. Discussed grief process in depth with patient and recommend she continue with scheduled counseling. She is doing better with activities around her home, but not ready to return to work yet. Will extend STD to 12/10/2023. Recommend she use  Alprazolam  1/2 tablet PRN for severe anxiety only and she was agreeable to try. Will not mix medication with other sedating substances or meds. Safe use reviewed. Follow up Sept as scheduled or sooner if needed.    I discussed the assessment and treatment plan with the patient. The patient was provided an opportunity to ask questions and all were answered. The patient agreed with the plan and demonstrated an understanding of the instructions.   The patient was advised to call back or seek an in-person evaluation if the symptoms worsen or if the condition fails to improve as anticipated.  I provided 15 minutes of non-face-to-face time during this encounter.  Return if symptoms worsen or fail to improve.  Thersia Schuyler Stark, [place]

## 2023-11-12 ENCOUNTER — Telehealth (HOSPITAL_BASED_OUTPATIENT_CLINIC_OR_DEPARTMENT_OTHER): Admitting: Family Medicine

## 2023-11-14 NOTE — Assessment & Plan Note (Signed)
 Patient presents with ongoing difficulty related to underlying anxiety and trouble sleeping.  Symptoms are primarily related to grief reaction related to recent passing of her husband.  On review of chart, she has been prescribed medications in the past including fluoxetine  and hydroxyzine .  These were initially prescribed by her prior PCP before establishing with our office.  She indicates that she did have penicillins ultimately did not start these medications when they were prescribed.  She presents with continued concerns related to medications. We discussed considerations today related to medication management, we discussed potential risks and benefits related to medications which have been prescribed previously, specifically reviewing fluoxetine  and hydroxyzine .  We discussed additional measures to assist with navigating recent passing of her husband.  She does plan to follow-up with her PCP here in our office in the near future for continued monitoring.  Advised that if she does have a concerns that do arise in the interim, can return to office sooner as needed

## 2023-12-04 ENCOUNTER — Other Ambulatory Visit (HOSPITAL_BASED_OUTPATIENT_CLINIC_OR_DEPARTMENT_OTHER)

## 2023-12-11 ENCOUNTER — Ambulatory Visit (INDEPENDENT_AMBULATORY_CARE_PROVIDER_SITE_OTHER): Admitting: Family Medicine

## 2023-12-11 ENCOUNTER — Encounter (HOSPITAL_BASED_OUTPATIENT_CLINIC_OR_DEPARTMENT_OTHER): Payer: Self-pay | Admitting: Family Medicine

## 2023-12-11 VITALS — BP 136/78 | HR 81 | Ht 62.0 in | Wt 221.7 lb

## 2023-12-11 DIAGNOSIS — F4321 Adjustment disorder with depressed mood: Secondary | ICD-10-CM | POA: Insufficient documentation

## 2023-12-11 DIAGNOSIS — Z1211 Encounter for screening for malignant neoplasm of colon: Secondary | ICD-10-CM

## 2023-12-11 DIAGNOSIS — E782 Mixed hyperlipidemia: Secondary | ICD-10-CM | POA: Diagnosis not present

## 2023-12-11 DIAGNOSIS — Z1231 Encounter for screening mammogram for malignant neoplasm of breast: Secondary | ICD-10-CM

## 2023-12-11 DIAGNOSIS — Z0000 Encounter for general adult medical examination without abnormal findings: Secondary | ICD-10-CM | POA: Diagnosis not present

## 2023-12-11 DIAGNOSIS — I10 Essential (primary) hypertension: Secondary | ICD-10-CM | POA: Diagnosis not present

## 2023-12-11 NOTE — Patient Instructions (Addendum)
 Shingrix Vaccine- Get at local pharmacy

## 2023-12-11 NOTE — Progress Notes (Signed)
 Subjective:   Cynthia [name] [birth year]  12/11/2023   CC: Chief Complaint  Patient presents with   Annual Exam    Patient is here today for her physical. States she is not sleeping well and also states she still has not gone back to work which she wants to talk about that.    HPI: Cynthia [name] is a 58 y.o. female who presents for a routine health maintenance exam.  Labs collected at time of visit.    GRIEF REACTION:  Patient is requesting extension of FMLA due to ongoing anxiety and grief and inability to focus for at least 2 weeks. She is in individual and group therapy for grief. She is taking her medication Fluoxetine  as directed and believes it has helped with depression, anxiety.   HEALTH SCREENINGS: - Vision Screening: up to date - Dental Visits: Recommended - Pap smear: not applicable - Breast Exam: Declined - STD Screening: Declined - Mammogram (40+): Ordered today  - Colonoscopy (45+): Ordered today  - Bone Density (65+ or under 65 with predisposing conditions): Not applicable  - Lung CA screening with low-dose CT:  Not applicable Adults age 10-80 who are current cigarette smokers or quit within the last 15 years. Must have 20 pack year history.   Depression and Anxiety Screen done today and results listed below:     12/11/2023    8:27 AM 11/05/2023   10:31 AM 10/09/2023   10:45 AM 09/27/2023   10:13 AM 08/06/2023   10:49 AM  Depression screen PHQ 2/9  Decreased Interest 1 3 3 3 3   Down, Depressed, Hopeless 1 2 3 3 3   PHQ - 2 Score 2 5 6 6 6   Altered sleeping 3 3 3 3 3   Tired, decreased energy 1 2 3 3 1   Change in appetite 1 1 0 0 0  Feeling bad or failure about yourself  1 1 2 2 3   Trouble concentrating 2 1 0 0 1  Moving slowly or fidgety/restless 1 2 3 3  0  Suicidal thoughts 0 0 0 0 0  PHQ-9 Score 11 15 17 17 14   Difficult doing work/chores Somewhat difficult Somewhat difficult Somewhat difficult Somewhat difficult Very difficult      12/11/2023     8:29 AM 11/05/2023   10:33 AM 10/09/2023   10:45 AM 09/27/2023   10:13 AM  GAD 7 : Generalized Anxiety Score  Nervous, Anxious, on Edge  1 3 3   Control/stop worrying 1 3 3 3   Worry too much - different things 3 1 3 3   Trouble relaxing 3 1 3 3   Restless 1 1 3 3   Easily annoyed or irritable 3 1 3 3   Afraid - awful might happen 3 1 3 3   Total GAD 7 Score  9 21 21   Anxiety Difficulty Somewhat difficult Somewhat difficult Somewhat difficult Somewhat difficult    IMMUNIZATIONS: - Tdap: Tetanus vaccination status reviewed: Declined. - HPV: Not applicable - Influenza: Refused - Pneumovax: Not applicable - Prevnar 20: Not applicable - Shingrix (50+): Recommended   Past medical history, surgical history, medications, allergies, family history and social history reviewed with patient today and changes made to appropriate areas of the chart.   Past Medical History:  Diagnosis Date   Allergy    High cholesterol    Hypertension    Migraine    Vertigo     Past Surgical History:  Procedure Laterality Date   ABDOMINAL HYSTERECTOMY     BREAST BIOPSY  Left 2018   BREAST EXCISIONAL BIOPSY Left    BREAST SURGERY Left    lump removed    CESAREAN SECTION     x2   CYST REMOVAL LEG     FRACTURE SURGERY Right    Arm   TONSILLECTOMY      Current Outpatient Medications on File Prior to Visit  Medication Sig   Albuterol -Budesonide  (AIRSUPRA ) 90-80 MCG/ACT AERO Inhale 2 Inhalations into the lungs once as needed for up to 1 dose (MAX 6 doses (12 inhalations) per 24 hours).   ALPRAZolam  (XANAX ) 0.25 MG tablet Take 0.5-1 tablets (0.125-0.25 mg total) by mouth at bedtime as needed for anxiety.   amLODipine  (NORVASC ) 10 MG tablet TAKE 1 TABLET BY MOUTH EVERY DAY   atorvastatin  (LIPITOR) 20 MG tablet TAKE 1 TABLET BY MOUTH EVERY DAY   Budeson-Glycopyrrol-Formoterol  (BREZTRI  AEROSPHERE) 160-9-4.8 MCG/ACT AERO Inhale 2 puffs into the lungs 2 (two) times daily.   cetirizine  (ZYRTEC ) 10 MG tablet Take  1 tablet (10 mg total) by mouth daily.   fluticasone  (FLONASE ) 50 MCG/ACT nasal spray Place 2 sprays into both nostrils daily.   hydrochlorothiazide  (HYDRODIURIL ) 25 MG tablet Take 1 tablet (25 mg total) by mouth daily.   montelukast  (SINGULAIR ) 10 MG tablet Take 1 tablet (10 mg total) by mouth at bedtime.   potassium chloride  SA (KLOR-CON  M) 20 MEQ tablet TAKE 1 TABLET BY MOUTH EVERY DAY   No current facility-administered medications on file prior to visit.    No Known Allergies   Social History   Socioeconomic History   Marital status: Married    Spouse name: Not on file   Number of children: Not on file   Years of education: Not on file   Highest education level: Not on file  Occupational History   Not on file  Tobacco Use   Smoking status: Former    Current packs/day: 0.00    Average packs/day: 0.5 packs/day for 10.0 years (5.0 ttl pk-yrs)    Types: Cigarettes    Start date: 06/04/2006    Quit date: 06/04/2016    Years since quitting: 7.5   Smokeless tobacco: Never  Vaping Use   Vaping status: Never Used  Substance and Sexual Activity   Alcohol use: Yes    Comment: occ   Drug use: No   Sexual activity: Not Currently    Partners: Male    Comment: Married  Other Topics Concern   Not on file  Social History Narrative   Lives at home w/ her husband   Left-handed   Caffeine: 2-3 sodas per day   Social Drivers of Corporate investment banker Strain: Not on file  Food Insecurity: Not on file  Transportation Needs: Not on file  Physical Activity: Not on file  Stress: Not on file  Social Connections: Not on file  Intimate Partner Violence: Not on file   Social History   Tobacco Use  Smoking Status Former   Current packs/day: 0.00   Average packs/day: 0.5 packs/day for 10.0 years (5.0 ttl pk-yrs)   Types: Cigarettes   Start date: 06/04/2006   Quit date: 06/04/2016   Years since quitting: 7.5  Smokeless Tobacco Never   Social History   Substance and Sexual  Activity  Alcohol Use Yes   Comment: occ    Family History  Problem Relation Age of Onset   Heart failure Mother    Hypertension Father    Kidney disease Father    Cancer Maternal Grandmother  Cancer Maternal Aunt    Colon cancer Neg Hx    Esophageal cancer Neg Hx    Rectal cancer Neg Hx    Stomach cancer Neg Hx      ROS: Denies fever, fatigue, unexplained weight loss/gain, chest pain, SHOB, and palpitations. Denies neurological deficits, gastrointestinal or genitourinary complaints, and skin changes.   Objective:   Today's Vitals   12/11/23 0823 12/11/23 0848  BP: (!) 147/93 136/78  Pulse: 81   SpO2: 98%   Weight: 221 lb 11.2 oz (100.6 kg)   Height: 5' 2 (1.575 m)     GENERAL APPEARANCE: Well-appearing, in NAD. Well nourished.  SKIN: Pink, warm and dry. Turgor normal. No rash, lesion, ulceration, or ecchymoses. Hair evenly distributed.  HEENT: HEAD: Normocephalic.  EYES: PERRLA. EOMI. Lids intact w/o defect. Sclera white, Conjunctiva pink w/o exudate.  EARS: External ear w/o redness, swelling, masses or lesions. EAC clear. TM's intact, translucent w/o bulging, appropriate landmarks visualized. Appropriate acuity to conversational tones.  NOSE: Septum midline w/o deformity. Nares patent, mucosa pink and non-inflamed w/o drainage. No sinus tenderness.  THROAT: Uvula midline. Oropharynx clear. Tonsils non-inflamed w/o exudate. Oral mucosa pink and moist.  NECK: Supple, Trachea midline. Full ROM w/o pain or tenderness. No lymphadenopathy. Thyroid  non-tender w/o enlargement or palpable masses.  RESPIRATORY: Chest wall symmetrical w/o masses. Respirations even and non-labored. Breath sounds clear to auscultation bilaterally. No wheezes, rales, rhonchi, or crackles. CARDIAC: S1, S2 present, regular rate and rhythm. No gallops, murmurs, rubs, or clicks. PMI w/o lifts, heaves, or thrills. No carotid bruits. Capillary refill <2 seconds. Peripheral pulses 2+ bilaterally. GI:  Abdomen soft w/o distention. Normoactive bowel sounds. No palpable masses or tenderness. No guarding or rebound tenderness. Liver and spleen w/o tenderness or enlargement. No CVA tenderness.   MSK: Muscle tone and strength appropriate for age, w/o atrophy or abnormal movement.  EXTREMITIES: Active ROM intact, w/o tenderness, crepitus, or contracture. No obvious joint deformities or effusions. No clubbing, edema, or cyanosis.  NEUROLOGIC: CN's II-XII intact. Motor strength symmetrical with no obvious weakness. No sensory deficits. DTR's 2+ symmetric bilaterally. Steady, even gait.  PSYCH/MENTAL STATUS: Alert, oriented x 3. Cooperative, appropriate mood and affect.    Assessment & Plan:  1. Annual physical exam (Primary) Discussed preventative screenings, vaccines, and healthy lifestyle with patient. Declined Flu, Tdap in office today. Will obtain fasting labs as part of AE.  - CBC with Differential/Platelet - Comprehensive metabolic panel with GFR  2. Essential hypertension Controlled. Continue current regimen and home monitoring of BP.   3. Situational depression Improved, but still struggling with focus due to grief. Will extend leave for 2 weeks with return on 12/24/2023. Pt in agreement.   4. Mixed hyperlipidemia Will check Lp with fastin labs today.  - Lipid panel  5. Screening for colon cancer - Ambulatory referral to Gastroenterology  6. Breast cancer screening by mammogram - MM 3D SCREENING MAMMOGRAM BILATERAL BREAST; Future   Orders Placed This Encounter  Procedures   MM 3D SCREENING MAMMOGRAM BILATERAL BREAST    Standing Status:   Future    Expiration Date:   12/10/2024    Reason for Exam (SYMPTOM  OR DIAGNOSIS REQUIRED):   Screening    Is the patient pregnant?:   No    Preferred imaging location?:   [facility]   CBC with Differential/Platelet   Comprehensive metabolic panel with GFR   Lipid panel   Ambulatory referral to Gastroenterology    Referral  Priority:   Routine  Referral Type:   Consultation    Referral Reason:   Specialty Services Required    Number of Visits Requested:   1    PATIENT COUNSELING:  - Encouraged a healthy well-balanced diet. Patient may adjust caloric intake to maintain or achieve ideal body weight. May reduce intake of dietary saturated fat and total fat and have adequate dietary potassium and calcium  preferably from fresh fruits, vegetables, and low-fat dairy products.   - Advised to avoid cigarette smoking. - Discussed with the patient that most people either abstain from alcohol or drink within safe limits (<=14/week and <=4 drinks/occasion for males, <=7/weeks and <= 3 drinks/occasion for females) and that the risk for alcohol disorders and other health effects rises proportionally with the number of drinks per week and how often a drinker exceeds daily limits. - Discussed cessation/primary prevention of drug use and availability of treatment for abuse.  - Discussed sexually transmitted diseases, avoidance of unintended pregnancy and contraceptive alternatives.  - Stressed the importance of regular exercise - Injury prevention: Discussed safety belts, safety helmets, smoke detector, smoking near bedding or upholstery.  - Dental health: Discussed importance of regular tooth brushing, flossing, and dental visits.   NEXT PREVENTATIVE PHYSICAL DUE IN 1 YEAR.  Return in about 1 year (around 12/10/2024) for ANNUAL PHYSICAL.  Patient to reach out to office if new, worrisome, or unresolved symptoms arise or if no improvement in patient's condition. Patient verbalized understanding and is agreeable to treatment plan. All questions answered to patient's satisfaction.    Thersia Schuyler Stark, [place]

## 2023-12-12 LAB — CBC WITH DIFFERENTIAL/PLATELET
Basophils Absolute: 0 x10E3/uL (ref 0.0–0.2)
Basos: 1 %
EOS (ABSOLUTE): 0.1 x10E3/uL (ref 0.0–0.4)
Eos: 2 %
Hematocrit: 39.9 % (ref 34.0–46.6)
Hemoglobin: 12.3 g/dL (ref 11.1–15.9)
Immature Grans (Abs): 0 x10E3/uL (ref 0.0–0.1)
Immature Granulocytes: 0 %
Lymphocytes Absolute: 2.2 x10E3/uL (ref 0.7–3.1)
Lymphs: 34 %
MCH: 25.3 pg — ABNORMAL LOW (ref 26.6–33.0)
MCHC: 30.8 g/dL — ABNORMAL LOW (ref 31.5–35.7)
MCV: 82 fL (ref 79–97)
Monocytes Absolute: 0.5 x10E3/uL (ref 0.1–0.9)
Monocytes: 8 %
Neutrophils Absolute: 3.6 x10E3/uL (ref 1.4–7.0)
Neutrophils: 55 %
Platelets: 409 x10E3/uL (ref 150–450)
RBC: 4.87 x10E6/uL (ref 3.77–5.28)
RDW: 14.5 % (ref 11.7–15.4)
WBC: 6.4 x10E3/uL (ref 3.4–10.8)

## 2023-12-12 LAB — LIPID PANEL
Chol/HDL Ratio: 3.9 ratio (ref 0.0–4.4)
Cholesterol, Total: 152 mg/dL (ref 100–199)
HDL: 39 mg/dL — ABNORMAL LOW (ref >39)
LDL Chol Calc (NIH): 92 mg/dL (ref 0–99)
Triglycerides: 114 mg/dL (ref 0–149)
VLDL Cholesterol Cal: 21 mg/dL (ref 5–40)

## 2023-12-12 LAB — COMPREHENSIVE METABOLIC PANEL WITH GFR
ALT: 15 IU/L (ref 0–32)
AST: 17 IU/L (ref 0–40)
Albumin: 4.2 g/dL (ref 3.8–4.9)
Alkaline Phosphatase: 121 IU/L (ref 44–121)
BUN/Creatinine Ratio: 14 (ref 12–28)
BUN: 13 mg/dL (ref 8–27)
Bilirubin Total: 0.4 mg/dL (ref 0.0–1.2)
CO2: 23 mmol/L (ref 20–29)
Calcium: 9.5 mg/dL (ref 8.7–10.3)
Chloride: 103 mmol/L (ref 96–106)
Creatinine, Ser: 0.92 mg/dL (ref 0.57–1.00)
Globulin, Total: 2.8 g/dL (ref 1.5–4.5)
Glucose: 95 mg/dL (ref 70–99)
Potassium: 3.4 mmol/L — ABNORMAL LOW (ref 3.5–5.2)
Sodium: 143 mmol/L (ref 134–144)
Total Protein: 7 g/dL (ref 6.0–8.5)
eGFR: 71 mL/min/1.73 (ref >59)

## 2023-12-15 ENCOUNTER — Ambulatory Visit (HOSPITAL_BASED_OUTPATIENT_CLINIC_OR_DEPARTMENT_OTHER): Payer: Self-pay | Admitting: Family Medicine

## 2023-12-15 NOTE — Progress Notes (Signed)
 Please call Cynthia [name] and make sure she is taking her potassium daily as her level was slightly under normal (3.5-5.0). I would recommend rechecking her level in 1 week. Her MCH does indicate a slight anemia and may be due to b12 or iron deficiency. If she would like iron panel and b12 checked we can add this on to her recheck. Her renal function and liver function is stable. Cholesterol is well controlled currently.

## 2023-12-24 ENCOUNTER — Encounter (HOSPITAL_BASED_OUTPATIENT_CLINIC_OR_DEPARTMENT_OTHER): Payer: Self-pay | Admitting: *Deleted

## 2023-12-24 ENCOUNTER — Other Ambulatory Visit (HOSPITAL_BASED_OUTPATIENT_CLINIC_OR_DEPARTMENT_OTHER): Payer: Self-pay | Admitting: Family Medicine

## 2023-12-24 DIAGNOSIS — D649 Anemia, unspecified: Secondary | ICD-10-CM

## 2023-12-24 DIAGNOSIS — E876 Hypokalemia: Secondary | ICD-10-CM

## 2023-12-25 LAB — IRON,TIBC AND FERRITIN PANEL
Ferritin: 60 ng/mL (ref 15–150)
Iron Saturation: 17 % (ref 15–55)
Iron: 58 ug/dL (ref 27–159)
Total Iron Binding Capacity: 336 ug/dL (ref 250–450)
UIBC: 278 ug/dL (ref 131–425)

## 2023-12-25 LAB — VITAMIN B12: Vitamin B-12: 754 pg/mL (ref 232–1245)

## 2023-12-25 LAB — BASIC METABOLIC PANEL WITH GFR
BUN/Creatinine Ratio: 11 — ABNORMAL LOW (ref 12–28)
BUN: 10 mg/dL (ref 8–27)
CO2: 24 mmol/L (ref 20–29)
Calcium: 9.6 mg/dL (ref 8.7–10.3)
Chloride: 103 mmol/L (ref 96–106)
Creatinine, Ser: 0.94 mg/dL (ref 0.57–1.00)
Glucose: 105 mg/dL — ABNORMAL HIGH (ref 70–99)
Potassium: 3.2 mmol/L — ABNORMAL LOW (ref 3.5–5.2)
Sodium: 143 mmol/L (ref 134–144)
eGFR: 69 mL/min/1.73 (ref >59)

## 2023-12-29 ENCOUNTER — Ambulatory Visit (HOSPITAL_BASED_OUTPATIENT_CLINIC_OR_DEPARTMENT_OTHER): Payer: Self-pay | Admitting: Family Medicine

## 2023-12-30 NOTE — Progress Notes (Signed)
 Patient's B12 and iron panels are stable.  Her potassium is continuing to decrease and is likely due to her hydrochlorothiazide .  Is she currently taking her potassium?  At this time, I would recommend stopping her HCTZ and starting medication such as losartan which will not decrease her potassium.  She will not need to take potassium replacement if she is agreeable to starting the losartan.  I would like to have this rechecked in 1 week with starting new medication.  If she is agreeable please let me know

## 2023-12-31 ENCOUNTER — Other Ambulatory Visit (HOSPITAL_BASED_OUTPATIENT_CLINIC_OR_DEPARTMENT_OTHER): Payer: Self-pay | Admitting: Family Medicine

## 2023-12-31 DIAGNOSIS — E876 Hypokalemia: Secondary | ICD-10-CM

## 2024-01-03 ENCOUNTER — Other Ambulatory Visit (HOSPITAL_BASED_OUTPATIENT_CLINIC_OR_DEPARTMENT_OTHER): Payer: Self-pay | Admitting: Family Medicine

## 2024-01-03 DIAGNOSIS — E876 Hypokalemia: Secondary | ICD-10-CM

## 2024-01-03 MED ORDER — POTASSIUM CHLORIDE CRYS ER 20 MEQ PO TBCR: 20.0000 meq | EXTENDED_RELEASE_TABLET | Freq: Every day | ORAL | 2 refills | Status: DC

## 2024-01-07 ENCOUNTER — Encounter: Payer: Self-pay | Admitting: Internal Medicine

## 2024-01-08 ENCOUNTER — Encounter (HOSPITAL_BASED_OUTPATIENT_CLINIC_OR_DEPARTMENT_OTHER): Payer: Self-pay | Admitting: Family Medicine

## 2024-01-08 ENCOUNTER — Ambulatory Visit (INDEPENDENT_AMBULATORY_CARE_PROVIDER_SITE_OTHER): Admitting: Family Medicine

## 2024-01-08 VITALS — BP 142/90 | HR 82 | Ht 62.0 in | Wt 222.0 lb

## 2024-01-08 DIAGNOSIS — Z111 Encounter for screening for respiratory tuberculosis: Secondary | ICD-10-CM | POA: Diagnosis not present

## 2024-01-08 DIAGNOSIS — I10 Essential (primary) hypertension: Secondary | ICD-10-CM

## 2024-01-08 DIAGNOSIS — E876 Hypokalemia: Secondary | ICD-10-CM

## 2024-01-08 LAB — BASIC METABOLIC PANEL WITH GFR
BUN/Creatinine Ratio: 13 (ref 12–28)
BUN: 11 mg/dL (ref 8–27)
CO2: 23 mmol/L (ref 20–29)
Calcium: 9.8 mg/dL (ref 8.7–10.3)
Chloride: 102 mmol/L (ref 96–106)
Creatinine, Ser: 0.83 mg/dL (ref 0.57–1.00)
Glucose: 86 mg/dL (ref 70–99)
Potassium: 3.9 mmol/L (ref 3.5–5.2)
Sodium: 142 mmol/L (ref 134–144)
eGFR: 81 mL/min/1.73 (ref >59)

## 2024-01-08 MED ORDER — LOSARTAN POTASSIUM 25 MG PO TABS: 25.0000 mg | ORAL_TABLET | Freq: Every day | ORAL | 3 refills | Status: DC

## 2024-01-08 NOTE — Addendum Note (Signed)
 Addended by: Sophonie Goforth on: 01/08/2024 04:55 PM   Modules accepted: Orders

## 2024-01-08 NOTE — Patient Instructions (Addendum)
 STOP HYDROCHLOROTHIAZIDE  STOP POTASSIUM PILL START LOSARTAN 25MG  TOMORROW   Mammogram: Call to schedule with Imaging Address: [address], [place], [place] [address] Phone: [phone] - First Floor

## 2024-01-08 NOTE — Progress Notes (Deleted)
 Subjective:   Cynthia [name] [birth year] 01/08/2024  Chief Complaint  Patient presents with   Medical Management of Chronic Issues    Pt is here following up on potassium levels. Also is needing TB test performed.    Discussed the use of AI scribe software for clinical note transcription with the patient, who gave verbal consent to proceed.  History of Present Illness      HPI: Cynthia [name] presents today for re-assessment and management of chronic medical conditions.   The following portions of the patient's history were reviewed and updated as appropriate: past medical history, past surgical history, family history, social history, allergies, medications, and problem list.   Patient Active Problem List   Diagnosis Date Noted   Situational depression 12/11/2023   Anxiety and depression 08/29/2023   Situational mixed anxiety and depressive disorder 08/06/2023   Pure hypercholesterolemia 03/22/2023   Hypokalemia 03/22/2023   Family history of myocardial infarction 09/11/2022   Vitamin D  deficiency 05/09/2021   Morbid obesity (HCC) 05/09/2021   Mixed hyperlipidemia 05/09/2021   Simple chronic bronchitis (HCC) 01/03/2021   Allergic rhinitis due to pollen 01/15/2018   Reactive airway disease 01/15/2018   Headache syndrome 04/13/2016   Chronic maxillary sinusitis 01/23/2016   History of uterine fibroid 01/23/2016   S/P hysterectomy 01/23/2016   Body mass index 36.0-36.9, adult 01/23/2016   Essential hypertension 12/15/2015   Slow transit constipation 12/15/2015   Past Medical History:  Diagnosis Date   Allergy    High cholesterol    Hypertension    Migraine    Vertigo    Past Surgical History:  Procedure Laterality Date   ABDOMINAL HYSTERECTOMY     BREAST BIOPSY Left 2018   BREAST EXCISIONAL BIOPSY Left    BREAST SURGERY Left    lump removed    CESAREAN SECTION     x2   CYST REMOVAL LEG     FRACTURE SURGERY Right    Arm   TONSILLECTOMY      Family History  Problem Relation Age of Onset   Heart failure Mother    Hypertension Father    Kidney disease Father    Cancer Maternal Grandmother    Cancer Maternal Aunt    Colon cancer Neg Hx    Esophageal cancer Neg Hx    Rectal cancer Neg Hx    Stomach cancer Neg Hx    Outpatient Medications Prior to Visit  Medication Sig Dispense Refill   Albuterol -Budesonide  (AIRSUPRA ) 90-80 MCG/ACT AERO Inhale 2 Inhalations into the lungs once as needed for up to 1 dose (MAX 6 doses (12 inhalations) per 24 hours). 10.7 g 5   ALPRAZolam  (XANAX ) 0.25 MG tablet Take 0.5-1 tablets (0.125-0.25 mg total) by mouth at bedtime as needed for anxiety. 20 tablet 0   amLODipine  (NORVASC ) 10 MG tablet TAKE 1 TABLET BY MOUTH EVERY DAY 90 tablet 1   atorvastatin  (LIPITOR) 20 MG tablet TAKE 1 TABLET BY MOUTH EVERY DAY 90 tablet 2   Budeson-Glycopyrrol-Formoterol  (BREZTRI  AEROSPHERE) 160-9-4.8 MCG/ACT AERO Inhale 2 puffs into the lungs 2 (two) times daily. 10.7 g 11   cetirizine  (ZYRTEC ) 10 MG tablet Take 1 tablet (10 mg total) by mouth daily. 30 tablet 11   fluticasone  (FLONASE ) 50 MCG/ACT nasal spray Place 2 sprays into both nostrils daily. 16 g 6   hydrochlorothiazide  (HYDRODIURIL ) 25 MG tablet Take 1 tablet (25 mg total) by mouth daily. 90 tablet 1   montelukast  (SINGULAIR ) 10 MG tablet Take  1 tablet (10 mg total) by mouth at bedtime. 30 tablet 3   potassium chloride  SA (KLOR-CON  M) 20 MEQ tablet Take 1 tablet (20 mEq total) by mouth daily. 60 tablet 2   No facility-administered medications prior to visit.   No Known Allergies   ROS: A complete ROS was performed with pertinent positives/negatives noted in the HPI. The remainder of the ROS are negative.    Objective:   Today's Vitals   01/08/24 1509  BP: (!) 144/92  Pulse: 82  SpO2: 97%  Weight: 222 lb (100.7 kg)  Height: 5' 2 (1.575 m)    Physical Exam   GENERAL: Well-appearing, in NAD. Well nourished.  SKIN: Pink, warm and dry. No rash,  lesion, ulceration, or ecchymoses.  Head: Normocephalic. NECK: Trachea midline. Full ROM w/o pain or tenderness. No lymphadenopathy.  EARS: Tympanic membranes are intact, translucent without bulging and without drainage. Appropriate landmarks visualized.  EYES: Conjunctiva clear without exudates. EOMI, PERRL, no drainage present.  NOSE: Septum midline w/o deformity. Nares patent, mucosa pink and non-inflamed w/o drainage. No sinus tenderness.  THROAT: Uvula midline. Oropharynx clear. Tonsils non-inflamed without exudate. Mucous membranes pink and moist.  RESPIRATORY: Chest wall symmetrical. Respirations even and non-labored. Breath sounds clear to auscultation bilaterally.  CARDIAC: S1, S2 present, regular rate and rhythm without murmur or gallops. Peripheral pulses 2+ bilaterally.  MSK: Muscle tone and strength appropriate for age. Joints w/o tenderness, redness, or swelling.  EXTREMITIES: Without clubbing, cyanosis, or edema.  NEUROLOGIC: No motor or sensory deficits. Steady, even gait. C2-C12 intact.  PSYCH/MENTAL STATUS: Alert, oriented x 3. Cooperative, appropriate mood and affect.   Health Maintenance Due  Topic Date Due   DTaP/Tdap/Td (1 - Tdap) Never done   Pneumococcal Vaccine: 50+ Years (1 of 2 - PCV) Never done   Zoster Vaccines- Shingrix (1 of 2) Never done   Mammogram  06/15/2022   Colonoscopy  05/17/2023   COVID-19 Vaccine (4 - 2025-26 season) 12/09/2023    No results found for any visits on 01/08/24.  The 10-year ASCVD risk score (Arnett DK, et al., 2019) is: 9%     Assessment & Plan:  *** There are no diagnoses linked to this encounter.  No orders of the defined types were placed in this encounter.  Lab Orders  No laboratory test(s) ordered today   No images are attached to the encounter or orders placed in the encounter.  No follow-ups on file.    Patient to reach out to office if new, worrisome, or unresolved symptoms arise or if no improvement in  patient's condition. Patient verbalized understanding and is agreeable to treatment plan. All questions answered to patient's satisfaction.    Thersia Schuyler Stark, [place]

## 2024-01-08 NOTE — Progress Notes (Signed)
 Subjective:   Cynthia [name] 1965-06-11 01/08/2024  Chief Complaint  Patient presents with   Medical Management of Chronic Issues    Pt is here following up on potassium levels. Also is needing TB test performed.    HPI: Cynthia [name] presents today for re-assessment and management of chronic medical conditions.   Cynthia [name]: Pt is prescribed potassium chloride  , but states she does not take it daily as directed. Pt states a pharmacist told her in the past to watch out for GI bleeding while taking potassium supplements, so she takes it every now and then.    HYPERTENSION: Cynthia [name] presents for the medical management of hypertension.  Patient's current hypertension medication regimen is: amlodipine  10mg  & hydrochlorothiazide  25mg  Patient is currently taking prescribed medications for HTN.  Patient is not regularly keeping a check on BP at home.  Adhering to low sodium diet: no  Exercising Regularly: no Denies headache, dizziness, CP, SHOB, vision changes.   BP Readings from Last 3 Encounters:  01/08/24 (!) 142/90  12/11/23 136/78  10/09/23 130/86    The following portions of the patient's history were reviewed and updated as appropriate: past medical history, past surgical history, family history, social history, allergies, medications, and problem list.   Patient Active Problem List   Diagnosis Date Noted   Situational depression 12/11/2023   Anxiety and depression 08/29/2023   Situational mixed anxiety and depressive disorder 08/06/2023   Pure hypercholesterolemia 03/22/2023   Cynthia [name] 03/22/2023   Family history of myocardial infarction 09/11/2022   Vitamin D  deficiency 05/09/2021   Morbid obesity (HCC) 05/09/2021   Mixed hyperlipidemia 05/09/2021   Simple chronic bronchitis (HCC) 01/03/2021   Allergic rhinitis due to pollen 01/15/2018   Reactive airway disease 01/15/2018   Headache syndrome 04/13/2016   Chronic maxillary  sinusitis 01/23/2016   History of uterine fibroid 01/23/2016   S/P hysterectomy 01/23/2016   Body mass index 36.0-36.9, adult 01/23/2016   Essential hypertension 12/15/2015   Slow transit constipation 12/15/2015   Past Medical History:  Diagnosis Date   Allergy    High cholesterol    Hypertension    Migraine    Vertigo    Past Surgical History:  Procedure Laterality Date   ABDOMINAL HYSTERECTOMY     BREAST BIOPSY Left 2018   BREAST EXCISIONAL BIOPSY Left    BREAST SURGERY Left    lump removed    CESAREAN SECTION     x2   CYST REMOVAL LEG     FRACTURE SURGERY Right    Arm   TONSILLECTOMY     Family History  Problem Relation Age of Onset   Heart failure Mother    Hypertension Father    Kidney disease Father    Cancer Maternal Grandmother    Cancer Maternal Aunt    Colon cancer Neg Hx    Esophageal cancer Neg Hx    Rectal cancer Neg Hx    Stomach cancer Neg Hx    Outpatient Medications Prior to Visit  Medication Sig Dispense Refill   Albuterol -Budesonide  (AIRSUPRA ) 90-80 MCG/ACT AERO Inhale 2 Inhalations into the lungs once as needed for up to 1 dose (MAX 6 doses (12 inhalations) per 24 hours). 10.7 g 5   ALPRAZolam  (XANAX ) 0.25 MG tablet Take 0.5-1 tablets (0.125-0.25 mg total) by mouth at bedtime as needed for anxiety. 20 tablet 0   amLODipine  (NORVASC ) 10 MG tablet TAKE 1 TABLET BY MOUTH EVERY DAY 90 tablet 1   atorvastatin  (  LIPITOR) 20 MG tablet TAKE 1 TABLET BY MOUTH EVERY DAY 90 tablet 2   Budeson-Glycopyrrol-Formoterol  (BREZTRI  AEROSPHERE) 160-9-4.8 MCG/ACT AERO Inhale 2 puffs into the lungs 2 (two) times daily. 10.7 g 11   cetirizine  (ZYRTEC ) 10 MG tablet Take 1 tablet (10 mg total) by mouth daily. 30 tablet 11   fluticasone  (FLONASE ) 50 MCG/ACT nasal spray Place 2 sprays into both nostrils daily. 16 g 6   montelukast  (SINGULAIR ) 10 MG tablet Take 1 tablet (10 mg total) by mouth at bedtime. 30 tablet 3   hydrochlorothiazide  (HYDRODIURIL ) 25 MG tablet Take 1  tablet (25 mg total) by mouth daily. 90 tablet 1   potassium chloride  SA (KLOR-CON  M) 20 MEQ tablet Take 1 tablet (20 mEq total) by mouth daily. 60 tablet 2   No facility-administered medications prior to visit.   No Known Allergies   ROS: A complete ROS was performed with pertinent positives/negatives noted in the HPI. The remainder of the ROS are negative.    Objective:   Today's Vitals   01/08/24 1509 01/08/24 1551  BP: (!) 144/92 (!) 142/90  Pulse: 82   SpO2: 97%   Weight: 100.7 kg   Height: 5' 2 (1.575 m)     GENERAL: Well-appearing, in NAD. Well nourished.  SKIN: Pink, warm and dry. No rash, lesion, ulceration, or ecchymoses.  Head: Normocephalic. NECK: Trachea midline.  RESPIRATORY: Chest wall symmetrical. Respirations even and non-labored.  CARDIAC: Peripheral pulses 2+ bilaterally.  MSK: Muscle tone and strength appropriate for age. Joints w/o tenderness, redness, or swelling.  EXTREMITIES: Without clubbing, cyanosis, or edema.  NEUROLOGIC: No motor or sensory deficits. Steady, even gait. C2-C12 intact.  PSYCH/MENTAL STATUS: Alert, oriented x 3. Cooperative, appropriate mood and affect.   Health Maintenance Due  Topic Date Due   DTaP/Tdap/Td (1 - Tdap) Never done   Pneumococcal Vaccine: 50+ Years (1 of 2 - PCV) Never done   Zoster Vaccines- Shingrix (1 of 2) Never done   Mammogram  06/15/2022   Colonoscopy  05/17/2023   COVID-19 Vaccine (4 - 2025-26 season) 12/09/2023    No results found for any visits on 01/08/24.  The 10-year ASCVD risk score (Arnett DK, et al., 2019) is: 8.7%     Assessment & Plan:  1. Screening-pulmonary TB (Primary) Pt states she is starting a new job, and they require a TB test. Discussed skin PPD test vs blood draw; pt states she would rather have the blood draw done today. Quantiferon lab ordered.   2. Essential hypertension 3. Cynthia [name] Uncontrolled. Cynthia [name] is likely related to hydrochlorothiazide . Discussed switching  from hydrochlorothiazide  to losartan 25mg  to improve BP control & potassium levels. Pt is in agreement with this and understands to stop the hydrochlorothiazide  and potassium supplements, and start losartan. Pt states she has already taken all of her meds today, therefore advised her to start the losartan tomorrow. Will recheck her BMP today & again in 2 weeks.  - Basic Metabolic Panel (BMET)   Meds ordered this encounter  Medications   losartan (COZAAR) 25 MG tablet    Sig: Take 1 tablet (25 mg total) by mouth daily.    Dispense:  30 tablet    Refill:  3    Supervising Provider:   DE PERU, RAYMOND J [[id number]]   Lab Orders         QuantiFERON-TB Gold Plus     No images are attached to the encounter or orders placed in the encounter.  Return for 2 weeks BP  Check and Lab (BMP only) ; 6 months follow up chronic .    Patient to reach out to office if new, worrisome, or unresolved symptoms arise or if no improvement in patient's condition. Patient verbalized understanding and is agreeable to treatment plan. All questions answered to patient's satisfaction.   Treatment plan and recommendation(s) reviewed by supervising preceptor, Thersia CLEMENTEEN Stark, FNP-C, prior to clinic discharge.   Rosina Ada, BSN, RN  DNP Student

## 2024-01-08 NOTE — Progress Notes (Signed)
 Subjective:   Cynthia [name] 08/29/1965 01/08/2024  Chief Complaint  Patient presents with   Medical Management of Chronic Issues    Pt is here following up on potassium levels. Also is needing TB test performed.    HPI: Cynthia [name] presents today for re-assessment and management of chronic medical conditions.   HYPOKALEMIA: Pt is prescribed potassium chloride  20mEq, but states she does not take it daily as directed. Pt states a pharmacist told her in the past to watch out for GI bleeding while taking potassium supplements, so she takes it every now and then. She was recommended to stop hydrochlorothiazide  and switch to Losartan. She is here to discuss this today.    HYPERTENSION: [name] presents for the medical management of hypertension.  Patient's current hypertension medication regimen is: amlodipine  10mg  & hydrochlorothiazide  25mg  Patient is currently taking prescribed medications for HTN.  Patient is not regularly keeping a check on BP at home.  Adhering to low sodium diet: no  Exercising Regularly: no Denies headache, dizziness, CP, SHOB, vision changes.   BP Readings from Last 3 Encounters:  01/08/24 (!) 142/90  12/11/23 136/78  10/09/23 130/86    The following portions of the patient's history were reviewed and updated as appropriate: past medical history, past surgical history, family history, social history, allergies, medications, and problem list.   Patient Active Problem List   Diagnosis Date Noted   Situational depression 12/11/2023   Anxiety and depression 08/29/2023   Situational mixed anxiety and depressive disorder 08/06/2023   Pure hypercholesterolemia 03/22/2023   Hypokalemia 03/22/2023   Family history of myocardial infarction 09/11/2022   Vitamin D  deficiency 05/09/2021   Morbid obesity (HCC) 05/09/2021   Mixed hyperlipidemia 05/09/2021   Simple chronic bronchitis (HCC) 01/03/2021   Allergic rhinitis due to pollen  01/15/2018   Reactive airway disease 01/15/2018   Headache syndrome 04/13/2016   Chronic maxillary sinusitis 01/23/2016   History of uterine fibroid 01/23/2016   S/P hysterectomy 01/23/2016   Body mass index 36.0-36.9, adult 01/23/2016   Essential hypertension 12/15/2015   Slow transit constipation 12/15/2015   Past Medical History:  Diagnosis Date   Allergy    High cholesterol    Hypertension    Migraine    Vertigo    Past Surgical History:  Procedure Laterality Date   ABDOMINAL HYSTERECTOMY     BREAST BIOPSY Left 2018   BREAST EXCISIONAL BIOPSY Left    BREAST SURGERY Left    lump removed    CESAREAN SECTION     x2   CYST REMOVAL LEG     FRACTURE SURGERY Right    Arm   TONSILLECTOMY     Family History  Problem Relation Age of Onset   Heart failure Mother    Hypertension Father    Kidney disease Father    Cancer Maternal Grandmother    Cancer Maternal Aunt    Colon cancer Neg Hx    Esophageal cancer Neg Hx    Rectal cancer Neg Hx    Stomach cancer Neg Hx    Outpatient Medications Prior to Visit  Medication Sig Dispense Refill   Albuterol -Budesonide  (AIRSUPRA ) 90-80 MCG/ACT AERO Inhale 2 Inhalations into the lungs once as needed for up to 1 dose (MAX 6 doses (12 inhalations) per 24 hours). 10.7 g 5   ALPRAZolam  (XANAX ) 0.25 MG tablet Take 0.5-1 tablets (0.125-0.25 mg total) by mouth at bedtime as needed for anxiety. 20 tablet 0   amLODipine  (  NORVASC ) 10 MG tablet TAKE 1 TABLET BY MOUTH EVERY DAY 90 tablet 1   atorvastatin  (LIPITOR) 20 MG tablet TAKE 1 TABLET BY MOUTH EVERY DAY 90 tablet 2   Budeson-Glycopyrrol-Formoterol  (BREZTRI  AEROSPHERE) 160-9-4.8 MCG/ACT AERO Inhale 2 puffs into the lungs 2 (two) times daily. 10.7 g 11   cetirizine  (ZYRTEC ) 10 MG tablet Take 1 tablet (10 mg total) by mouth daily. 30 tablet 11   fluticasone  (FLONASE ) 50 MCG/ACT nasal spray Place 2 sprays into both nostrils daily. 16 g 6   montelukast  (SINGULAIR ) 10 MG tablet Take 1 tablet (10  mg total) by mouth at bedtime. 30 tablet 3   hydrochlorothiazide  (HYDRODIURIL ) 25 MG tablet Take 1 tablet (25 mg total) by mouth daily. 90 tablet 1   potassium chloride  SA (KLOR-CON  M) 20 MEQ tablet Take 1 tablet (20 mEq total) by mouth daily. 60 tablet 2   No facility-administered medications prior to visit.   No Known Allergies   ROS: A complete ROS was performed with pertinent positives/negatives noted in the HPI. The remainder of the ROS are negative.    Objective:   Today's Vitals   01/08/24 1509 01/08/24 1551  BP: (!) 144/92 (!) 142/90  Pulse: 82   SpO2: 97%   Weight: 222 lb (100.7 kg)   Height: 5' 2 (1.575 m)     GENERAL: Well-appearing, in NAD. Well nourished.  SKIN: Pink, warm and dry. No rash, lesion, ulceration, or ecchymoses.  Head: Normocephalic. NECK: Trachea midline.  RESPIRATORY: Chest wall symmetrical. Respirations even and non-labored.  CARDIAC: Peripheral pulses 2+ bilaterally.  MSK: Muscle tone and strength appropriate for age. Joints w/o tenderness, redness, or swelling.  EXTREMITIES: Without clubbing, cyanosis, or edema.  NEUROLOGIC: No motor or sensory deficits. Steady, even gait. C2-C12 intact.  PSYCH/MENTAL STATUS: Alert, oriented x 3. Cooperative, appropriate mood and affect.   Health Maintenance Due  Topic Date Due   DTaP/Tdap/Td (1 - Tdap) Never done   Pneumococcal Vaccine: 50+ Years (1 of 2 - PCV) Never done   Zoster Vaccines- Shingrix (1 of 2) Never done   Mammogram  06/15/2022   Colonoscopy  05/17/2023   COVID-19 Vaccine (4 - 2025-26 season) 12/09/2023    No results found for any visits on 01/08/24.  The 10-year ASCVD risk score (Arnett DK, et al., 2019) is: 8.7%     Assessment & Plan:  1. Screening-pulmonary TB (Primary) Pt states she is starting a new job, and they require a TB test. Discussed skin PPD test vs blood draw; pt states she would rather have the blood draw done today. Quantiferon lab ordered.   2. Essential  hypertension 3. Hypokalemia Uncontrolled. Hypokalemia is likely related to hydrochlorothiazide . Discussed switching from hydrochlorothiazide  to losartan 25mg  to improve BP control & potassium levels. Pt is in agreement with this and understands to stop the hydrochlorothiazide  and potassium supplements, and start losartan. Pt states she has already taken all of her meds today, therefore advised her to start the losartan tomorrow. Will recheck her BMP today & again in 2 weeks.  - Basic Metabolic Panel (BMET)   Meds ordered this encounter  Medications   losartan (COZAAR) 25 MG tablet    Sig: Take 1 tablet (25 mg total) by mouth daily.    Dispense:  30 tablet    Refill:  3    Supervising Provider:   DE PERU, RAYMOND J [[id number]]   Lab Orders         QuantiFERON-TB Gold Plus  No images are attached to the encounter or orders placed in the encounter.  Return for 2 weeks BP Check and Lab (BMP only) ; 6 months follow up chronic .    Patient to reach out to office if new, worrisome, or unresolved symptoms arise or if no improvement in patient's condition. Patient verbalized understanding and is agreeable to treatment plan. All questions answered to patient's satisfaction.   Thersia Stark, FNP-C

## 2024-01-09 ENCOUNTER — Ambulatory Visit (HOSPITAL_BASED_OUTPATIENT_CLINIC_OR_DEPARTMENT_OTHER): Payer: Self-pay | Admitting: Family Medicine

## 2024-01-09 DIAGNOSIS — N289 Disorder of kidney and ureter, unspecified: Secondary | ICD-10-CM

## 2024-01-09 NOTE — Progress Notes (Signed)
 Hi Florine,  Your electrolytes, potassium, and kidney function have improved and are at baseline. Please start the Losartan as prescribed and STOP the hydrochlorothiazide  and potassium supplement. Return on 10/15 for your lab check.

## 2024-01-09 NOTE — Telephone Encounter (Signed)
 Sent pt a [employer] geologist.

## 2024-01-09 NOTE — Telephone Encounter (Signed)
 Copied from CRM [id number]. Topic: Clinical - Lab/Test Results >> Jan 09, 2024  1:42 PM Cynthia [name] wrote: Reason for CRM: Pt called verbalizing understanding of lab results, also wants to know how her TB test?

## 2024-01-12 LAB — QUANTIFERON-TB GOLD PLUS
QuantiFERON Mitogen Value: >10 [IU]/mL
QuantiFERON Nil Value: 0.05 [IU]/mL
QuantiFERON TB1 Ag Value: 0.05 [IU]/mL
QuantiFERON TB2 Ag Value: 0.05 [IU]/mL
QuantiFERON-TB Gold Plus: NEGATIVE

## 2024-01-12 NOTE — Progress Notes (Signed)
 Hi Cynthia [name],  Your TB test is negative.

## 2024-01-15 ENCOUNTER — Telehealth: Payer: Self-pay

## 2024-01-15 NOTE — Telephone Encounter (Signed)
 DONE

## 2024-01-15 NOTE — Telephone Encounter (Signed)
 Copied from CRM [id number]. Topic: Clinical - Lab/Test Results >> Jan 15, 2024 10:46 AM Cynthia [name] wrote: Reason for CRM: Need TB test results faxed to [facility] [phone]

## 2024-01-22 ENCOUNTER — Ambulatory Visit (HOSPITAL_BASED_OUTPATIENT_CLINIC_OR_DEPARTMENT_OTHER)

## 2024-01-27 ENCOUNTER — Ambulatory Visit (HOSPITAL_BASED_OUTPATIENT_CLINIC_OR_DEPARTMENT_OTHER)

## 2024-02-03 ENCOUNTER — Other Ambulatory Visit (HOSPITAL_BASED_OUTPATIENT_CLINIC_OR_DEPARTMENT_OTHER): Payer: Self-pay | Admitting: Family Medicine

## 2024-02-03 ENCOUNTER — Telehealth (HOSPITAL_BASED_OUTPATIENT_CLINIC_OR_DEPARTMENT_OTHER): Payer: Self-pay | Admitting: *Deleted

## 2024-02-03 ENCOUNTER — Ambulatory Visit (INDEPENDENT_AMBULATORY_CARE_PROVIDER_SITE_OTHER): Admitting: *Deleted

## 2024-02-03 VITALS — BP 149/82

## 2024-02-03 DIAGNOSIS — E876 Hypokalemia: Secondary | ICD-10-CM | POA: Diagnosis not present

## 2024-02-03 DIAGNOSIS — I10 Essential (primary) hypertension: Secondary | ICD-10-CM | POA: Diagnosis not present

## 2024-02-03 MED ORDER — LOSARTAN POTASSIUM 50 MG PO TABS: 50.0000 mg | ORAL_TABLET | Freq: Every day | ORAL | 3 refills | Status: DC

## 2024-02-03 NOTE — Telephone Encounter (Signed)
 Attempted to call pt but unable to reach. Left VM for pt to return call. Also have sent pt a mychart message.

## 2024-02-03 NOTE — Telephone Encounter (Signed)
-----   Message from Thersia Bitters Caudle sent at 02/03/2024  4:50 PM EDT ----- Regarding: BP Medication Change Please let patient know that I have made a change to her losartan.  She should stop the losartan 25 mg tablets or finished her current prescription and then she will increase to losartan 50 mg tablets.  She will take 1 tablet daily.  I have sent this into her pharmacy.  Please have her schedule a blood pressure check in 4 to 6 weeks.

## 2024-02-03 NOTE — Progress Notes (Signed)
 Patient came in today for BP recheck. BP checked twice and both readings have been documented. Pt also was taken to lab for repeat labwork.

## 2024-02-04 LAB — BASIC METABOLIC PANEL WITH GFR
BUN/Creatinine Ratio: 16 (ref 12–28)
BUN: 18 mg/dL (ref 8–27)
CO2: 22 mmol/L (ref 20–29)
Calcium: 9.4 mg/dL (ref 8.7–10.3)
Chloride: 106 mmol/L (ref 96–106)
Creatinine, Ser: 1.13 mg/dL — ABNORMAL HIGH (ref 0.57–1.00)
Glucose: 85 mg/dL (ref 70–99)
Potassium: 4 mmol/L (ref 3.5–5.2)
Sodium: 143 mmol/L (ref 134–144)
eGFR: 56 mL/min/1.73 — ABNORMAL LOW (ref >59)

## 2024-02-04 NOTE — Telephone Encounter (Signed)
Attempted to call pt but unable to reach. Left message to return call.  

## 2024-02-04 NOTE — Telephone Encounter (Signed)
 Copied from CRM [id number]. Topic: Clinical - Medication Question >> Feb 04, 2024  2:18 PM Cynthia [name] wrote: Reason for CRM: The patient has questions regarding their blood pressure medication, losartan (Cozaar) 50 mg. On 10/27, the patient recorded a blood pressure reading of 141/-, but does not recall the bottom number. The patient is requesting a callback from [place] at [phone]

## 2024-02-04 NOTE — Telephone Encounter (Signed)
 Pt is requesting a call back regarding new prescription.

## 2024-02-04 NOTE — Telephone Encounter (Signed)
 Patient calling back. Requesting a call back to discuss change in medication.   Best callback number: [phone]

## 2024-02-04 NOTE — Telephone Encounter (Signed)
 Returned call to pt about blood pressure and medication dose increase. Explained to pt why PCP increased the dose of her medication due to her BP not being controlled by the current dose she was on and pt stated that she did not want to increase the medication if there was possibly of it effecting her kidneys. Stated to pt that was the reason why we did the bloodwork to check kidney function due to increasing the dose of the medication. Pt wanted to know the results of the labwork and stated to pt that labwork had not been reviewed yet. Stated to pt once labwork had been reviewed that we would let her know. Routing all to [place] for review.

## 2024-02-04 NOTE — Telephone Encounter (Signed)
 Copied from CRM [id number]. Topic: General - Other >> Feb 03, 2024  4:58 PM Delon DASEN wrote: Reason for CRM: patient returned call, gave message from mychart.

## 2024-02-05 ENCOUNTER — Other Ambulatory Visit (HOSPITAL_BASED_OUTPATIENT_CLINIC_OR_DEPARTMENT_OTHER): Payer: Self-pay | Admitting: Family Medicine

## 2024-02-05 DIAGNOSIS — I10 Essential (primary) hypertension: Secondary | ICD-10-CM

## 2024-02-05 MED ORDER — LOSARTAN POTASSIUM 25 MG PO TABS: 25.0000 mg | ORAL_TABLET | Freq: Every day | ORAL | Status: DC

## 2024-02-05 NOTE — Progress Notes (Signed)
 Could you please call patient and schedule her for a lab visit on Monday?  She also stated she wanted to get scheduled for an office visit for other concerns  Discussed over the phone with patient recent decrease with GFR and creatinine results with starting ARB.  Patient does not want to stop losartan 25 mg at this time.  She states that blood pressure is likely increased due to stress with the passing of her husband.  She states she would like to continue on her current dosage of losartan 25 and amlodipine  10 mg daily.  She will not pick up the losartan 50 mg.  She will increase her clear fluid intake as she states she does not drink a lot of water daily.  We discussed the importance of staying well-hydrated and recommended water intake daily.  She would like to return on Monday for a lab recheck.  We discussed red flag signs and symptoms of AKI and she verbalized understanding.  Discussed if renal function is worsening, she will need to stop ARB.  Discussed risks of uncontrolled hypertension with patient and she verbalized understanding

## 2024-02-05 NOTE — Addendum Note (Signed)
 Addended by: Eyoel Throgmorton on: 02/05/2024 12:11 PM   Modules accepted: Orders

## 2024-02-10 ENCOUNTER — Ambulatory Visit: Admitting: *Deleted

## 2024-02-10 ENCOUNTER — Ambulatory Visit (HOSPITAL_BASED_OUTPATIENT_CLINIC_OR_DEPARTMENT_OTHER): Admitting: Family Medicine

## 2024-02-10 VITALS — Ht 62.0 in | Wt 225.0 lb

## 2024-02-10 DIAGNOSIS — Z860100 Personal history of colon polyps, unspecified: Secondary | ICD-10-CM

## 2024-02-10 DIAGNOSIS — N289 Disorder of kidney and ureter, unspecified: Secondary | ICD-10-CM

## 2024-02-10 MED ORDER — NA SULFATE-K SULFATE-MG SULF 17.5-3.13-1.6 GM/177ML PO SOLN: 1.0000 | Freq: Once | ORAL | 0 refills | Status: AC

## 2024-02-10 NOTE — Progress Notes (Signed)
 Pt's name and DOB verified at the beginning of the pre-visit with 2 identifiers  Permission given to speak with  Pt denies any difficulty with ambulating,sitting, laying down or rolling side to side  Pt has no issues moving head neck or swallowing  No egg or soy allergy known to patient   No issues known to pt with past sedation  No FH of Malignant Hyperthermia  Pt is not on home 02   Pt is not on blood thinners    Pt has frequent issues with constipation RN instructed pt to use Miralax  per bottles instructions a week before prep days. Pt states they will  Pt is not on dialysis  Pt denise any abnormal heart rhythms   Pt denies any upcoming cardiac testing  Patient's chart reviewed by Norleen Schillings CNRA prior to pre-visit and patient appropriate for the [facility].  Pre-visit completed and red dot placed by patient's name on their procedure day (on provider's schedule).    Visit in person  Pt states weight is 225 lb   Pt given  both [facility] main # and MD on call # prior to instructions.  Informed pt to come in at the time discussed and is shown on PV instructions.  Pt instructed to use [employer] or GoodRx for a price reduction on prep  Instructed pt where to find PV instructions in My Ch. Copy of instructions given to pt Instructed pt on all aspects of written instructions including med holds clothing to wear and foods to eat and not eat as well as after procedure legal restrictions and to call MD on call if needed.. Pt states understanding. Instructed pt to review instructions again prior to procedure and call main # given if has any questions or any issues. Pt states they will.

## 2024-02-11 ENCOUNTER — Ambulatory Visit (HOSPITAL_BASED_OUTPATIENT_CLINIC_OR_DEPARTMENT_OTHER): Payer: Self-pay | Admitting: Family Medicine

## 2024-02-11 LAB — BASIC METABOLIC PANEL WITH GFR
BUN/Creatinine Ratio: 18 (ref 12–28)
BUN: 16 mg/dL (ref 8–27)
CO2: 21 mmol/L (ref 20–29)
Calcium: 9.7 mg/dL (ref 8.7–10.3)
Chloride: 106 mmol/L (ref 96–106)
Creatinine, Ser: 0.9 mg/dL (ref 0.57–1.00)
Glucose: 82 mg/dL (ref 70–99)
Potassium: 3.8 mmol/L (ref 3.5–5.2)
Sodium: 141 mmol/L (ref 134–144)
eGFR: 73 mL/min/1.73 (ref >59)

## 2024-02-11 NOTE — Progress Notes (Signed)
 Renal function has returned to baseline with medication changes.

## 2024-02-18 ENCOUNTER — Ambulatory Visit (INDEPENDENT_AMBULATORY_CARE_PROVIDER_SITE_OTHER): Admitting: Family Medicine

## 2024-02-18 ENCOUNTER — Encounter (HOSPITAL_BASED_OUTPATIENT_CLINIC_OR_DEPARTMENT_OTHER): Payer: Self-pay | Admitting: Family Medicine

## 2024-02-18 ENCOUNTER — Encounter: Payer: Self-pay | Admitting: Internal Medicine

## 2024-02-18 VITALS — BP 142/88 | HR 94 | Ht 62.0 in | Wt 223.0 lb

## 2024-02-18 DIAGNOSIS — R1031 Right lower quadrant pain: Secondary | ICD-10-CM | POA: Insufficient documentation

## 2024-02-18 DIAGNOSIS — I10 Essential (primary) hypertension: Secondary | ICD-10-CM

## 2024-02-18 LAB — POCT URINALYSIS DIP (CLINITEK)
Bilirubin, UA: NEGATIVE
Blood, UA: NEGATIVE
Glucose, UA: NEGATIVE mg/dL
Ketones, POC UA: NEGATIVE mg/dL
Nitrite, UA: NEGATIVE
POC PROTEIN,UA: NEGATIVE
Spec Grav, UA: >=1.03 — AB (ref 1.010–1.025)
Urobilinogen, UA: 0.2 U/dL
pH, UA: 5.5 (ref 5.0–8.0)

## 2024-02-18 MED ORDER — TRAMADOL HCL 50 MG PO TABS: 50.0000 mg | ORAL_TABLET | Freq: Three times a day (TID) | ORAL | 0 refills | Status: AC | PRN

## 2024-02-18 NOTE — Patient Instructions (Signed)
 Continue to take your Losartan 25mg  . If your blood pressure at home is over 140/90 consistently with taking your medication, please let me know.   Schedule your CT scan.

## 2024-02-18 NOTE — Progress Notes (Signed)
 Subjective:   TATELYN VANHECKE June 28, 1965 02/18/2024  Chief Complaint  Patient presents with   side pain    Pt has been having pain in right side which she states she has had for a while but states the pain is getting worse.    Discussed the use of AI scribe software for clinical note transcription with the patient, who gave verbal consent to proceed.  History of Present Illness MYCA PERNO is a 58 year old female who presents with pelvic pain.  She has a history of fibroids and underwent a hysterectomy in her thirties, with the right ovary left intact. She now experiences discomfort and pain in the area of the right ovary, which has worsened recently. The pain is described as a sensation of something being 'in the way' rather than crampy and is exacerbated by lifting, bending, and sitting up or down.  No nausea, vomiting, diarrhea, vaginal bleeding, or discharge.   She has been taking Bayer aspirin for pain management but is cautious about its effects on her stomach. She is currently on losartan 25 mg for blood pressure management, which was recently adjusted from hydrochlorothiazide  due to chronic hypokalemia. She has not yet increased the dose to Losartan 50 mg. BMP was rechecked last week and is at baseline.   Her past medical history includes a cyst that was drained prior to her hysterectomy and a history of large uterine fibroid requiring hysterectomy after childbirth.   She avoids sodas and drinks water frequently, with occasional juice and unsweetened tea.    The following portions of the patient's history were reviewed and updated as appropriate: past medical history, past surgical history, family history, social history, allergies, medications, and problem list.   Patient Active Problem List   Diagnosis Date Noted   Right lower quadrant abdominal pain 02/18/2024   Situational depression 12/11/2023   Anxiety and depression 08/29/2023   Situational mixed  anxiety and depressive disorder 08/06/2023   Pure hypercholesterolemia 03/22/2023   Hypokalemia 03/22/2023   Family history of myocardial infarction 09/11/2022   Vitamin D  deficiency 05/09/2021   Morbid obesity (HCC) 05/09/2021   Mixed hyperlipidemia 05/09/2021   Simple chronic bronchitis (HCC) 01/03/2021   Allergic rhinitis due to pollen 01/15/2018   Reactive airway disease 01/15/2018   Headache syndrome 04/13/2016   Chronic maxillary sinusitis 01/23/2016   History of uterine fibroid 01/23/2016   S/P hysterectomy 01/23/2016   Body mass index 36.0-36.9, adult 01/23/2016   Essential hypertension 12/15/2015   Slow transit constipation 12/15/2015   Past Medical History:  Diagnosis Date   Allergy    Arthritis    Asthma    Depression    High cholesterol    Hypertension    Migraine    Vertigo    Past Surgical History:  Procedure Laterality Date   ABDOMINAL HYSTERECTOMY     In her 30's per patient ; Patient reports right ovary present   BREAST BIOPSY Left 2018   BREAST EXCISIONAL BIOPSY Left    BREAST SURGERY Left    lump removed    CESAREAN SECTION     x2   CYST REMOVAL LEG     FRACTURE SURGERY Right    Arm   TONSILLECTOMY     Family History  Problem Relation Age of Onset   Heart failure Mother    Hypertension Father    Kidney disease Father    Cancer Maternal Aunt    Cancer Maternal Grandmother    Colon  cancer Neg Hx    Esophageal cancer Neg Hx    Rectal cancer Neg Hx    Stomach cancer Neg Hx    Colon polyps Neg Hx    Outpatient Medications Prior to Visit  Medication Sig Dispense Refill   Albuterol -Budesonide  (AIRSUPRA ) 90-80 MCG/ACT AERO Inhale 2 Inhalations into the lungs once as needed for up to 1 dose (MAX 6 doses (12 inhalations) per 24 hours). 10.7 g 5   ALPRAZolam  (XANAX ) 0.25 MG tablet Take 0.5-1 tablets (0.125-0.25 mg total) by mouth at bedtime as needed for anxiety. 20 tablet 0   amLODipine  (NORVASC ) 10 MG tablet TAKE 1 TABLET BY MOUTH EVERY DAY 90  tablet 1   atorvastatin  (LIPITOR) 20 MG tablet TAKE 1 TABLET BY MOUTH EVERY DAY 90 tablet 2   Budeson-Glycopyrrol-Formoterol  (BREZTRI  AEROSPHERE) 160-9-4.8 MCG/ACT AERO Inhale 2 puffs into the lungs 2 (two) times daily. 10.7 g 11   cetirizine  (ZYRTEC ) 10 MG tablet Take 1 tablet (10 mg total) by mouth daily. 30 tablet 11   ELDERBERRY PO Take by mouth daily at 12 noon.     fluticasone  (FLONASE ) 50 MCG/ACT nasal spray Place 2 sprays into both nostrils daily. 16 g 6   losartan (COZAAR) 25 MG tablet Take 1 tablet (25 mg total) by mouth daily.     montelukast  (SINGULAIR ) 10 MG tablet Take 1 tablet (10 mg total) by mouth at bedtime. 30 tablet 3   OVER THE COUNTER MEDICATION daily at 12 noon. Hair skin nails     No facility-administered medications prior to visit.   No Known Allergies   ROS: A complete ROS was performed with pertinent positives/negatives noted in the HPI. The remainder of the ROS are negative.    Objective:   Today's Vitals   02/18/24 1542 02/18/24 1624  BP: (!) 158/85 (!) 142/88  Pulse: 94   SpO2: 98%   Weight: 223 lb (101.2 kg)   Height: 5' 2 (1.575 m)     Physical Exam   GENERAL: Well-appearing, in NAD. Well nourished.  SKIN: Pink, warm and dry.  Head: Normocephalic. NECK: Trachea midline. Full ROM w/o pain or tenderness.  RESPIRATORY: Chest wall symmetrical. Respirations even and non-labored.  GI: Abdomen soft. No tenderness to palpation, no distension. Mild palpable mass present to RLQ on exam. Normoactive bowel sounds. No rebound tenderness. No hepatomegaly or splenomegaly. No CVA tenderness.  MSK: Muscle tone and strength appropriate for age.  EXTREMITIES: Without clubbing, cyanosis, or edema.  NEUROLOGIC: No motor or sensory deficits. Steady, even gait. C2-C12 intact.  PSYCH/MENTAL STATUS: Alert, oriented x 3. Cooperative, appropriate mood and affect.    Results for orders placed or performed in visit on 02/18/24  POCT URINALYSIS DIP (CLINITEK)  Result  Value Ref Range   Color, UA yellow yellow   Clarity, UA clear clear   Glucose, UA negative negative mg/dL   Bilirubin, UA negative negative   Ketones, POC UA negative negative mg/dL   Spec Grav, UA >=8.969 (A) 1.010 - 1.025   Blood, UA negative negative   pH, UA 5.5 5.0 - 8.0   POC PROTEIN,UA negative negative, trace   Urobilinogen, UA 0.2 0.2 or 1.0 E.U./dL   Nitrite, UA Negative Negative   Leukocytes, UA Trace (A) Negative         Assessment & Plan:  1. Right lower quadrant abdominal pain (Primary) UA is unremarkable for signs of infection. Will obtain CT ABD pelvis to evaluate ongoing RLQ and R pelvic pain. Recent BMP within the  past week is WNL. Will provide Tramadol for pain, safe use reviewed with patient. Reviewed red flag signs and symptoms with patient to seek ER for. Will await CT results.  - POCT URINALYSIS DIP (CLINITEK) - CT ABDOMEN PELVIS W CONTRAST; Future - traMADol (ULTRAM) 50 MG tablet; Take 1 tablet (50 mg total) by mouth every 8 (eight) hours as needed for up to 5 days.  Dispense: 15 tablet; Refill: 0  2. Essential hypertension Patient took BP medication during start of visit. BP improved with manual recheck. She will continue on losartan 25mg  and amlodipine  10mg  currently and return in 4-6 weeks for BP check. If BP above 140/90, will increase losartan to 50mg .     Meds ordered this encounter  Medications   traMADol (ULTRAM) 50 MG tablet    Sig: Take 1 tablet (50 mg total) by mouth every 8 (eight) hours as needed for up to 5 days.    Dispense:  15 tablet    Refill:  0    Supervising Provider:   DE CUBA, RAYMOND J [[id number]]   Lab Orders         POCT URINALYSIS DIP (CLINITEK)      Return in about 4 weeks (around 03/17/2024) for BP Check Only Nurse Visit .    Patient to reach out to office if new, worrisome, or unresolved symptoms arise or if no improvement in patient's condition. Patient verbalized understanding and is agreeable to treatment plan. All  questions answered to patient's satisfaction.    Thersia Schuyler Stark, [place]

## 2024-02-19 ENCOUNTER — Ambulatory Visit (HOSPITAL_BASED_OUTPATIENT_CLINIC_OR_DEPARTMENT_OTHER)
Admission: RE | Admit: 2024-02-19 | Discharge: 2024-02-19 | Disposition: A | Discharge status: Home / Self Care | Source: Ambulatory Visit | Attending: Family Medicine | Admitting: Family Medicine

## 2024-02-19 DIAGNOSIS — R1031 Right lower quadrant pain: Secondary | ICD-10-CM | POA: Diagnosis present

## 2024-02-19 MED ORDER — IOHEXOL 300 MG/ML  SOLN
100.0000 mL | Freq: Once | INTRAMUSCULAR | Status: AC | PRN
  Administered 2024-02-19: 100 mL via INTRAVENOUS

## 2024-02-20 ENCOUNTER — Ambulatory Visit (HOSPITAL_BASED_OUTPATIENT_CLINIC_OR_DEPARTMENT_OTHER): Payer: Self-pay | Admitting: Family Medicine

## 2024-02-20 NOTE — Progress Notes (Signed)
 Hi Cynthia [name],  Your CT scan is negative for any signs of pelvic masses, fibroids, or concerning features. This may be a ligament strain. I would recommend use of stretching, heating pad, and Tylenol as needed. If pain worsens or does not improve, please let me know.

## 2024-02-24 ENCOUNTER — Ambulatory Visit: Admitting: Internal Medicine

## 2024-02-24 ENCOUNTER — Encounter: Payer: Self-pay | Admitting: Internal Medicine

## 2024-02-24 VITALS — BP 94/58 | HR 83 | Temp 97.7°F | Resp 20 | Ht 62.0 in | Wt 225.0 lb

## 2024-02-24 DIAGNOSIS — K648 Other hemorrhoids: Secondary | ICD-10-CM | POA: Diagnosis not present

## 2024-02-24 DIAGNOSIS — Z1211 Encounter for screening for malignant neoplasm of colon: Secondary | ICD-10-CM | POA: Diagnosis present

## 2024-02-24 DIAGNOSIS — Z860101 Personal history of adenomatous and serrated colon polyps: Secondary | ICD-10-CM

## 2024-02-24 DIAGNOSIS — D120 Benign neoplasm of cecum: Secondary | ICD-10-CM | POA: Diagnosis not present

## 2024-02-24 DIAGNOSIS — K644 Residual hemorrhoidal skin tags: Secondary | ICD-10-CM | POA: Diagnosis not present

## 2024-02-24 DIAGNOSIS — K6389 Other specified diseases of intestine: Secondary | ICD-10-CM | POA: Diagnosis not present

## 2024-02-24 DIAGNOSIS — Z860100 Personal history of colon polyps, unspecified: Secondary | ICD-10-CM

## 2024-02-24 DIAGNOSIS — D122 Benign neoplasm of ascending colon: Secondary | ICD-10-CM

## 2024-02-24 MED ORDER — SODIUM CHLORIDE 0.9 % IV SOLN: 500.0000 mL | Freq: Once | INTRAVENOUS | Status: DC

## 2024-02-24 NOTE — Progress Notes (Signed)
 Called to room to assist during endoscopic procedure.  Patient ID and intended procedure confirmed with present staff. Received instructions for my participation in the procedure from the performing physician.

## 2024-02-24 NOTE — Progress Notes (Signed)
 Pt's states no medical or surgical changes since previsit or office visit.

## 2024-02-24 NOTE — Progress Notes (Signed)
 Sedate, gd SR, tolerated procedure well, VSS, report to RN

## 2024-02-24 NOTE — Patient Instructions (Signed)
 Repeat colonoscopy in 5 years for surveillance. Resume previous diet.  Continue present medications. Awaiting pathology results. Handout provided on polyps.   YOU HAD AN ENDOSCOPIC PROCEDURE TODAY AT THE [facility]:   Refer to the procedure report that was given to you for any specific questions about what was found during the examination.  If the procedure report does not answer your questions, please call your gastroenterologist to clarify.  If you requested that your care partner not be given the details of your procedure findings, then the procedure report has been included in a sealed envelope for you to review at your convenience later.  YOU SHOULD EXPECT: Some feelings of bloating in the abdomen. Passage of more gas than usual.  Walking can help get rid of the air that was put into your GI tract during the procedure and reduce the bloating. If you had a lower endoscopy (such as a colonoscopy or flexible sigmoidoscopy) you may notice spotting of blood in your stool or on the toilet paper. If you underwent a bowel prep for your procedure, you may not have a normal bowel movement for a few days.  Please Note:  You might notice some irritation and congestion in your nose or some drainage.  This is from the oxygen used during your procedure.  There is no need for concern and it should clear up in a day or so.  SYMPTOMS TO REPORT IMMEDIATELY:  Following lower endoscopy (colonoscopy or flexible sigmoidoscopy):  Excessive amounts of blood in the stool  Significant tenderness or worsening of abdominal pains  Swelling of the abdomen that is new, acute  Fever of 100F or higher  For urgent or emergent issues, a gastroenterologist can be reached at any hour by calling [phone]. Do not use MyChart messaging for urgent concerns.    DIET:  We do recommend a small meal at first, but then you may proceed to your regular diet.  Drink plenty of fluids but you should avoid alcoholic  beverages for 24 hours.  ACTIVITY:  You should plan to take it easy for the rest of today and you should NOT DRIVE or use heavy machinery until tomorrow (because of the sedation medicines used during the test).    FOLLOW UP: Our staff will call the number listed on your records the next business day following your procedure.  We will call around 7:15- 8:00 am to check on you and address any questions or concerns that you may have regarding the information given to you following your procedure. If we do not reach you, we will leave a message.     If any biopsies were taken you will be contacted by phone or by letter within the next 1-3 weeks.  Please call us  at [phone] if you have not heard about the biopsies in 3 weeks.    SIGNATURES/CONFIDENTIALITY: You and/or your care partner have signed paperwork which will be entered into your electronic medical record.  These signatures attest to the fact that that the information above on your After Visit Summary has been reviewed and is understood.  Full responsibility of the confidentiality of this discharge information lies with you and/or your care-partner.

## 2024-02-24 NOTE — Progress Notes (Signed)
 HISTORY OF PRESENT ILLNESS:  Cynthia [name] is a 58 y.o. female with a history of multiple polyps.  Previous exam with Dr. Aneita.  Presents now for surveillance colonoscopy  REVIEW OF SYSTEMS:  All non-GI ROS negative except for  Past Medical History:  Diagnosis Date   Allergy    Arthritis    Asthma    Depression    High cholesterol    Hypertension    Migraine    Vertigo     Past Surgical History:  Procedure Laterality Date   ABDOMINAL HYSTERECTOMY     In her 30's per patient ; Patient reports right ovary present   BREAST BIOPSY Left 2018   BREAST EXCISIONAL BIOPSY Left    BREAST SURGERY Left    lump removed    CESAREAN SECTION     x2   CYST REMOVAL LEG     FRACTURE SURGERY Right    Arm   TONSILLECTOMY      Social History Cynthia [name]  reports that she quit smoking about 7 years ago. Her smoking use included cigarettes. She started smoking about 17 years ago. She has a 5 pack-year smoking history. She has never used smokeless tobacco. She reports current alcohol use. She reports that she does not use drugs.  family history includes Cancer in her maternal aunt and maternal grandmother; Heart failure in her mother; Hypertension in her father; Kidney disease in her father.  No Known Allergies     PHYSICAL EXAMINATION: Vital signs: BP 137/84   Pulse 74   Temp 97.7 F (36.5 C)   Resp 12   Ht 5' 2 (1.575 m)   Wt 225 lb (102.1 kg)   SpO2 97%   BMI 41.15 kg/m  General: Well-developed, well-nourished, no acute distress HEENT: Sclerae are anicteric, conjunctiva pink. Oral mucosa intact Lungs: Clear Heart: Regular Abdomen: soft, nontender, nondistended, no obvious ascites, no peritoneal signs, normal bowel sounds. No organomegaly. Extremities: No edema Psychiatric: alert and oriented x3. Cooperative     ASSESSMENT:  History of sessile serrated polyps   PLAN:  Surveillance colonoscopy

## 2024-02-24 NOTE — Op Note (Signed)
 [facility] Patient Name: Cynthia [name] Procedure Date: 02/24/2024 8:09 AM MRN: [id number] Endoscopist: Norleen SAILOR. Abran , MD, [id number] Age: 58 Referring MD:  Date of Birth: 1966/01/31 Gender: Female Account #: [id number] Procedure:                Colonoscopy with cold snare polypectomy x 3 Indications:              High risk colon cancer surveillance: Personal                            history of non-advanced adenoma, High risk colon                            cancer surveillance: Personal history of multiple                            sessile serrated colon polyp (less than 10 mm in                            size) with no dysplasia. Previous examination with                            Dr. Aneita February 2022 Medicines:                Monitored Anesthesia Care Procedure:                Pre-Anesthesia Assessment:                           - Prior to the procedure, a History and Physical                            was performed, and patient medications and                            allergies were reviewed. The patient's tolerance of                            previous anesthesia was also reviewed. The risks                            and benefits of the procedure and the sedation                            options and risks were discussed with the patient.                            All questions were answered, and informed consent                            was obtained. Prior Anticoagulants: The patient has                            taken no anticoagulant or antiplatelet agents. ASA  Grade Assessment: II - A patient with mild systemic                            disease. After reviewing the risks and benefits,                            the patient was deemed in satisfactory condition to                            undergo the procedure.                           After obtaining informed consent, the colonoscope                            was  passed under direct vision. Throughout the                            procedure, the patient's blood pressure, pulse, and                            oxygen saturations were monitored continuously. The                            CF [id number] #[id number] was introduced through the anus                            and advanced to the the cecum, identified by                            appendiceal orifice and ileocecal valve. The                            ileocecal valve, appendiceal orifice, and rectum                            were photographed. The quality of the bowel                            preparation was excellent. The colonoscopy was                            performed without difficulty. The patient tolerated                            the procedure well. The bowel preparation used was                            SUPREP via split dose instruction. Scope In: 8:24:30 AM Scope Out: 8:36:59 AM Scope Withdrawal Time: 0 hours 9 minutes 52 seconds  Total Procedure Duration: 0 hours 12 minutes 29 seconds  Findings:                 Three polyps were found in the ascending colon and  cecum. The polyps were 1 to 4 mm in size. These                            polyps were removed with a cold snare. Resection                            and retrieval were complete.                           External and internal hemorrhoids were found during                            retroflexion. The hemorrhoids were moderate.                           The exam was otherwise without abnormality on                            direct and retroflexion views. Complications:            No immediate complications. Estimated blood loss:                            None. Estimated Blood Loss:     Estimated blood loss: none. Impression:               - Three 1 to 4 mm polyps in the ascending colon and                            in the cecum, removed with a cold snare. Resected                             and retrieved.                           - External and internal hemorrhoids.                           - The examination was otherwise normal on direct                            and retroflexion views. Recommendation:           - Repeat colonoscopy in 5 years for surveillance.                           - Patient has a contact number available for                            emergencies. The signs and symptoms of potential                            delayed complications were discussed with the                            patient. Return  to normal activities tomorrow.                            Written discharge instructions were provided to the                            patient.                           - Resume previous diet.                           - Continue present medications.                           - Await pathology results. Norleen SAILOR. Abran, MD 02/24/2024 8:49:27 AM This report has been signed electronically.

## 2024-02-25 ENCOUNTER — Telehealth: Payer: Self-pay

## 2024-02-25 NOTE — Telephone Encounter (Signed)
  Follow up Call-     02/24/2024    7:18 AM  Call back number  Post procedure Call Back phone  # [phone]  Permission to leave phone message Yes     Patient questions:  Do you have a fever, pain , or abdominal swelling? No. Pain Score  0 *  Have you tolerated food without any problems? Yes.    Have you been able to return to your normal activities? Yes.    Do you have any questions about your discharge instructions: Diet   No. Medications  No. Follow up visit  No.  Do you have questions or concerns about your Care? No.  Actions: * If pain score is 4 or above: No action needed, pain <4.

## 2024-02-26 ENCOUNTER — Ambulatory Visit: Payer: Self-pay | Admitting: Internal Medicine

## 2024-02-26 LAB — SURGICAL PATHOLOGY

## 2024-02-27 ENCOUNTER — Telehealth (HOSPITAL_BASED_OUTPATIENT_CLINIC_OR_DEPARTMENT_OTHER): Payer: Self-pay | Admitting: Family Medicine

## 2024-02-27 NOTE — Telephone Encounter (Signed)
 Copied from CRM #[id number]. Topic: Clinical - Lab/Test Results >> Feb 27, 2024 12:15 PM Gustabo D wrote: Pt wants a call back to go over her lab results when they are in

## 2024-02-27 NOTE — Telephone Encounter (Signed)
 Copied from CRM [id number]. Topic: Clinical - Lab/Test Results >> Feb 26, 2024  3:09 PM Joesph B wrote: Reason for CRM: patient is calling back for results, relayed but she would like for a nurse to fu.

## 2024-02-28 NOTE — Telephone Encounter (Signed)
 Called and spoke with pt letting her know the results of recent imaging and labwork and she verbalized understanding. Nothing further needed.

## 2024-03-17 ENCOUNTER — Ambulatory Visit (HOSPITAL_BASED_OUTPATIENT_CLINIC_OR_DEPARTMENT_OTHER)

## 2024-04-04 ENCOUNTER — Other Ambulatory Visit (HOSPITAL_BASED_OUTPATIENT_CLINIC_OR_DEPARTMENT_OTHER): Payer: Self-pay | Admitting: Family Medicine

## 2024-04-04 DIAGNOSIS — I10 Essential (primary) hypertension: Secondary | ICD-10-CM

## 2024-04-05 ENCOUNTER — Other Ambulatory Visit (HOSPITAL_BASED_OUTPATIENT_CLINIC_OR_DEPARTMENT_OTHER): Payer: Self-pay | Admitting: Family Medicine

## 2024-04-05 DIAGNOSIS — I10 Essential (primary) hypertension: Secondary | ICD-10-CM

## 2024-04-07 NOTE — Telephone Encounter (Signed)
 Copied from CRM #[id number]. Topic: Clinical - Prescription Issue >> Apr 06, 2024  1:20 PM Fredrica W wrote: Reason for CRM: Patient called to check status of medication. States she is out and it has not been sent in. Let her know 2 were sent today. She states she should be on 3 and would like a call back. Thank You

## 2024-04-07 NOTE — Telephone Encounter (Signed)
 Based off of pt's last OV 11/11, showing that pt was on amlodipine  and losartan . Per note, it states for pt to continue on losartan  and amlodipine . Do not see anything about pt being on a 3rd BP med.  Routing to Alexis's box for review.

## 2024-07-08 ENCOUNTER — Ambulatory Visit (HOSPITAL_BASED_OUTPATIENT_CLINIC_OR_DEPARTMENT_OTHER): Admitting: Family Medicine
